# Patient Record
Sex: Female | Born: 1970 | Race: Black or African American | Hispanic: No | Marital: Single
Health system: Midwestern US, Community
[De-identification: ages and names within clinical notes are randomized; demographics above are authoritative.]

## PROBLEM LIST (undated history)

## (undated) DIAGNOSIS — N939 Abnormal uterine and vaginal bleeding, unspecified: Secondary | ICD-10-CM

## (undated) DIAGNOSIS — K219 Gastro-esophageal reflux disease without esophagitis: Secondary | ICD-10-CM

## (undated) DIAGNOSIS — M1990 Unspecified osteoarthritis, unspecified site: Secondary | ICD-10-CM

## (undated) DIAGNOSIS — E538 Deficiency of other specified B group vitamins: Secondary | ICD-10-CM

## (undated) DIAGNOSIS — K589 Irritable bowel syndrome without diarrhea: Secondary | ICD-10-CM

## (undated) DIAGNOSIS — G43909 Migraine, unspecified, not intractable, without status migrainosus: Secondary | ICD-10-CM

## (undated) DIAGNOSIS — M797 Fibromyalgia: Secondary | ICD-10-CM

## (undated) DIAGNOSIS — J45909 Unspecified asthma, uncomplicated: Secondary | ICD-10-CM

## (undated) DIAGNOSIS — D1771 Benign lipomatous neoplasm of kidney: Secondary | ICD-10-CM

## (undated) DIAGNOSIS — M7710 Lateral epicondylitis, unspecified elbow: Secondary | ICD-10-CM

## (undated) DIAGNOSIS — F419 Anxiety disorder, unspecified: Secondary | ICD-10-CM

## (undated) DIAGNOSIS — R0602 Shortness of breath: Secondary | ICD-10-CM

## (undated) DIAGNOSIS — N390 Urinary tract infection, site not specified: Secondary | ICD-10-CM

## (undated) DIAGNOSIS — Z87440 Personal history of urinary (tract) infections: Secondary | ICD-10-CM

## (undated) DIAGNOSIS — Z9884 Bariatric surgery status: Secondary | ICD-10-CM

## (undated) DIAGNOSIS — Z6841 Body Mass Index (BMI) 40.0 and over, adult: Secondary | ICD-10-CM

## (undated) DIAGNOSIS — E66813 Obesity, class 3: Secondary | ICD-10-CM

## (undated) DIAGNOSIS — Z01818 Encounter for other preprocedural examination: Secondary | ICD-10-CM

## (undated) HISTORY — DX: Migraine, unspecified, not intractable, without status migrainosus: G43.909

## (undated) HISTORY — DX: Deficiency of other specified B group vitamins: E53.8

## (undated) HISTORY — DX: Urinary tract infection, site not specified: N39.0

## (undated) HISTORY — DX: Lateral epicondylitis, unspecified elbow: M77.10

## (undated) HISTORY — PX: REDUCTION MAMMAPLASTY: SUR839

---
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
## 1999-01-25 ENCOUNTER — Ambulatory Visit (HOSPITAL_COMMUNITY): Admission: RE | Admit: 1999-01-25 | Discharge: 1999-01-25 | Payer: Self-pay | Admitting: Obstetrics and Gynecology

## 1999-01-25 ENCOUNTER — Encounter: Payer: Self-pay | Admitting: Obstetrics and Gynecology

## 1999-07-01 ENCOUNTER — Inpatient Hospital Stay (HOSPITAL_COMMUNITY): Admission: AD | Admit: 1999-07-01 | Discharge: 1999-07-03 | Payer: Self-pay | Admitting: Obstetrics and Gynecology

## 2000-08-14 ENCOUNTER — Other Ambulatory Visit: Admission: RE | Admit: 2000-08-14 | Discharge: 2000-08-14 | Payer: Self-pay | Admitting: Obstetrics and Gynecology

## 2001-08-20 ENCOUNTER — Other Ambulatory Visit: Admission: RE | Admit: 2001-08-20 | Discharge: 2001-08-20 | Payer: Self-pay | Admitting: Internal Medicine

## 2002-06-03 ENCOUNTER — Ambulatory Visit (HOSPITAL_COMMUNITY): Admission: RE | Admit: 2002-06-03 | Discharge: 2002-06-03 | Payer: Self-pay | Admitting: Oral & Maxillofacial Surgery

## 2002-09-03 ENCOUNTER — Other Ambulatory Visit: Admission: RE | Admit: 2002-09-03 | Discharge: 2002-09-03 | Payer: Self-pay | Admitting: Obstetrics and Gynecology

## 2002-12-11 HISTORY — PX: PALATAL EXPANSION: SHX2147

## 2003-07-07 ENCOUNTER — Ambulatory Visit (HOSPITAL_COMMUNITY): Admission: RE | Admit: 2003-07-07 | Discharge: 2003-07-07 | Payer: Self-pay | Admitting: Gastroenterology

## 2003-09-07 ENCOUNTER — Other Ambulatory Visit: Admission: RE | Admit: 2003-09-07 | Discharge: 2003-09-07 | Payer: Self-pay | Admitting: Obstetrics and Gynecology

## 2004-08-18 ENCOUNTER — Other Ambulatory Visit: Admission: RE | Admit: 2004-08-18 | Discharge: 2004-08-18 | Payer: Self-pay | Admitting: Obstetrics and Gynecology

## 2005-08-22 ENCOUNTER — Other Ambulatory Visit: Admission: RE | Admit: 2005-08-22 | Discharge: 2005-08-22 | Payer: Self-pay | Admitting: Obstetrics and Gynecology

## 2007-04-18 ENCOUNTER — Encounter: Admission: RE | Admit: 2007-04-18 | Discharge: 2007-04-18 | Payer: Self-pay | Admitting: Family Medicine

## 2008-04-27 ENCOUNTER — Emergency Department (HOSPITAL_COMMUNITY): Admission: EM | Admit: 2008-04-27 | Discharge: 2008-04-27 | Payer: Self-pay | Admitting: Family Medicine

## 2011-04-28 NOTE — Op Note (Signed)
   NAME:  Andrea [name], Andrea [name]                       ACCOUNT NO.:  [id number]   MEDICAL RECORD NO.:  [id number]                   PATIENT TYPE:  AMB   LOCATION:  ENDO                                 FACILITY:  [facility]   PHYSICIAN:  Graylin Shiver, M.D.                DATE OF BIRTH:  1971/07/15   DATE OF PROCEDURE:  07/07/2003  DATE OF DISCHARGE:                                 OPERATIVE REPORT   PROCEDURE:  Colonoscopy.   INDICATION FOR PROCEDURE:  Heme-positive stool.   Informed consent was obtained after explanation of the risks of bleeding,  infection, and perforation.   PREMEDICATION:  Fentanyl 60 mcg IV, Versed 6 mg IV.   DESCRIPTION OF PROCEDURE:  With the patient in the left lateral decubitus  position, a rectal exam was performed and no masses were felt.  The Olympus  colonoscope was inserted into the rectum and advanced around the colon to  the cecum.  Cecal landmarks were identified.  The terminal ileum was  intubated and the first few centimeters of terminal ileum looked normal.  The cecum and ascending colon were normal.  The transverse colon was normal.  The descending colon, sigmoid, and rectum were normal.  She tolerated the  procedure well without complications.   IMPRESSION:  Normal colonoscopy to the cecum.                                               Graylin Shiver, M.D.    SFG/MEDQ  D:  07/07/2003  T:  07/07/2003  Job:  [id number]   cc:   Tomi Bamberger, FNP, [facility]

## 2011-04-28 NOTE — H&P (Signed)
[facility]  Patient:    Andrea [name], Andrea [name] Visit Number: [id number] MRN: [id number]          Service Type: DSU Location: DAY Attending Physician:  Sammuel Bailiff Dictated by:   Dorthula Matas, D.D.S. Admit Date:  06/03/2002                           History and Physical  HISTORY OF PRESENT ILLNESS:  The patient is a 40 year old female who is well known to me who has a severe dental and skeletal dysplasia.  The patient has maxillary transverse constriction as well as a ______ posterior maxillary vertical access.  I have discussed with her at length the indications for surgery and have also discussed with her the pros and cons of surgical care. She is aware of these risks as well as benefits and desires to proceed with a two-stage correction of her skeletal dysplasia.  Stage I will consist of a surgically-assisted wrapped palate expansion to correct the maxillary transverse constriction, and stage II will be a Jerry Caras 1 osteotomy to impact the maxilla and close the open bite (apertognathia.  She is aware of the multiple risks associated with surgery which include but are not limited to the following:  Swelling, bruising, infection, sinus problems, nasal congestion, trismus or limited mouth opening, temporomandibular joint pain and/or dysfunction, need for further surgical care, need for further orthodontic care, possible relapse of the surgical correction and orthodontic correction, possible loss of soft tissue and/or teeth, possible need for root canal therapy, facial profile changes, and nasal width and shape changes.  The patient desires to proceed with the planned surgical procedure, and this will be done at [facility] in the main OR but as an outpatient today. Dictated by:   Dorthula Matas, D.D.S. Attending Physician:  Sammuel Bailiff DD:  06/03/02 TD:  06/03/02 Job: [id number]

## 2011-04-28 NOTE — Op Note (Signed)
[facility]  Patient:    Andrea [name], Andrea [name] Visit Number: [id number] MRN: [id number]          Service Type: DSU Location: DAY Attending Physician:  Sammuel Bailiff Dictated by:   Dorthula Matas, D.D.S. Proc. Date: 06/03/02 Admit Date:  06/03/2002                             Operative Report  PREOPERATIVE DIAGNOSIS:  Maxillary transverse constriction.  POSTOPERATIVE DIAGNOSES:  Maxillary transverse constriction.  OPERATION:  Surgically-assisted ________ palate expansion via modified LeFort wand osteotomy.  SURGEON:  Dorthula Matas, D.D.S.  ANESTHESIA:  General anesthesia via nose and tracheal tube.  CULTURES:  None.  DRAINS:  None.  SPECIMENS:  None.  COMPLICATIONS:  None.  DESCRIPTION OF PROCEDURE:  The patient brought to the OR and placed on the OR table in supine position.  She was then placed under general anesthesia, nose and tracheal tube was inserted.  She was prepped and draped in the sterile manner for an orthonathic surgical procedure.  At this point 0.5% Marcaine with 1:200,000 epinephrine was used to infiltrate into the maxillary vestibule and also used to give right and left greater palatine nerve blocks and a nasal palatine nerve block.  Posterior superior nerve blocks and infraorbital nerve blocks were also administered.  A total of 5.4 cc was given.  At this point a cautery cutting mode was used to make a horizontal incision in the ________ mucosa of the vestibule, starting at the right segment at buttress area and in the piriform rim area.  This incision was taken down to periosteum.  The periosteum was incised with a #15 blade.  A full-thickness mucoperiosteal flap was reflected and the infraorbital was identified, as well as the piriform rim.  A tunneling was done back to the jugular maxillary fissure region with a periosteal elevator.  The nasal mucosa along the lateral rim of the nose was also released with  the periosteal elevator.  At this point a horizontal osteotomy of the lateral maxillary wall was made with a 701 bur, starting at the right piriform rim and going back to the zygomatic buttress, and then from the zygomatic buttress posteriorly as far as I could go with the drill.  I then switched to the reciprocating saw and used the saw to cut some zygomatic buttress back to the posterior aspect of the maxillary sinus.  The jugular maxillary osteotome was then placed along the lateral and inferior aspect of the jugular maxillary fissure.  This was mounted to release the maxilla from the pterygoid plate.  At this point my attention was directed to the opposite side, where an incision was made from the zygomatic buttress region to the piriform rim region in the attached mucosa of the vestibule, using the Bovie cautery in cutting mode.  Again, the periosteum as released with a #15 blade and full-thickness mucoperiosteal flap was reflected.  A similar osteotomy cut was made from the zygomatic buttress to the piriform rim on this opposite side, and then using a reciprocating saw from the zygomatic buttress back to the pterygoid maxillary fissure region.  Again, similar dissection had been carried out with the nasal mucosa being reached from the lateral inferior aspect of the piriform rim.  Again, the pterygoid maxillary osteotome was placed in the pterygoid maxillary fissure at Dictated by:   Dorthula Matas, D.D.S. Attending Physician:  Sammuel Bailiff DD:  06/03/02 TD:  06/04/02 Job: [id number]

## 2011-06-23 ENCOUNTER — Encounter: Payer: Self-pay | Admitting: Family Medicine

## 2011-10-31 ENCOUNTER — Other Ambulatory Visit: Payer: Self-pay | Admitting: Family Medicine

## 2011-10-31 DIAGNOSIS — E0590 Thyrotoxicosis, unspecified without thyrotoxic crisis or storm: Secondary | ICD-10-CM

## 2011-11-15 ENCOUNTER — Other Ambulatory Visit: Payer: Self-pay | Admitting: Gastroenterology

## 2011-11-15 DIAGNOSIS — R1011 Right upper quadrant pain: Secondary | ICD-10-CM

## 2011-11-15 DIAGNOSIS — R110 Nausea: Secondary | ICD-10-CM

## 2011-11-23 ENCOUNTER — Encounter (HOSPITAL_COMMUNITY)
Admission: RE | Admit: 2011-11-23 | Discharge: 2011-11-23 | Disposition: A | Discharge status: Home / Self Care | Payer: BC Managed Care – PPO | Source: Ambulatory Visit | Attending: Family Medicine | Admitting: Family Medicine

## 2011-11-23 DIAGNOSIS — E0590 Thyrotoxicosis, unspecified without thyrotoxic crisis or storm: Secondary | ICD-10-CM

## 2011-11-24 ENCOUNTER — Encounter (HOSPITAL_COMMUNITY)
Admission: RE | Admit: 2011-11-24 | Discharge: 2011-11-24 | Disposition: A | Discharge status: Home / Self Care | Payer: BC Managed Care – PPO | Source: Ambulatory Visit | Attending: Family Medicine | Admitting: Family Medicine

## 2011-11-24 ENCOUNTER — Encounter (HOSPITAL_COMMUNITY): Payer: Self-pay

## 2011-11-24 MED ORDER — SODIUM IODIDE I 131 CAPSULE
6.9000 | Freq: Once | INTRAVENOUS | Status: AC | PRN
  Administered 2011-11-23: 6.9 via ORAL

## 2011-11-24 MED ORDER — SODIUM PERTECHNETATE TC 99M INJECTION
10.7000 | Freq: Once | INTRAVENOUS | Status: AC | PRN
  Administered 2011-11-24: 10.7 via INTRAVENOUS

## 2011-12-06 ENCOUNTER — Ambulatory Visit (HOSPITAL_COMMUNITY)
Admission: RE | Admit: 2011-12-06 | Discharge: 2011-12-06 | Disposition: A | Discharge status: Home / Self Care | Payer: BC Managed Care – PPO | Source: Ambulatory Visit | Attending: Gastroenterology | Admitting: Gastroenterology

## 2011-12-06 DIAGNOSIS — R1084 Generalized abdominal pain: Secondary | ICD-10-CM | POA: Insufficient documentation

## 2011-12-06 DIAGNOSIS — R110 Nausea: Secondary | ICD-10-CM

## 2011-12-06 DIAGNOSIS — R1011 Right upper quadrant pain: Secondary | ICD-10-CM

## 2011-12-06 DIAGNOSIS — R935 Abnormal findings on diagnostic imaging of other abdominal regions, including retroperitoneum: Secondary | ICD-10-CM | POA: Insufficient documentation

## 2011-12-06 MED ORDER — SINCALIDE 5 MCG IJ SOLR
INTRAMUSCULAR | Status: AC
  Administered 2011-12-06: 3.48 ug via INTRAVENOUS
  Filled 2011-12-06: qty 5

## 2011-12-06 MED ORDER — TECHNETIUM TC 99M MEBROFENIN IV KIT
5.0000 | PACK | Freq: Once | INTRAVENOUS | Status: AC | PRN
  Administered 2011-12-06: 5 via INTRAVENOUS

## 2012-06-24 ENCOUNTER — Other Ambulatory Visit: Payer: Self-pay | Admitting: Family Medicine

## 2012-06-24 DIAGNOSIS — N2889 Other specified disorders of kidney and ureter: Secondary | ICD-10-CM

## 2012-06-28 ENCOUNTER — Ambulatory Visit
Admission: RE | Admit: 2012-06-28 | Discharge: 2012-06-28 | Disposition: A | Discharge status: Home / Self Care | Payer: BC Managed Care – PPO | Source: Ambulatory Visit | Attending: Family Medicine | Admitting: Family Medicine

## 2012-06-28 DIAGNOSIS — N2889 Other specified disorders of kidney and ureter: Secondary | ICD-10-CM

## 2012-12-05 ENCOUNTER — Other Ambulatory Visit: Payer: Self-pay | Admitting: Family Medicine

## 2012-12-05 ENCOUNTER — Ambulatory Visit
Admission: RE | Admit: 2012-12-05 | Discharge: 2012-12-05 | Disposition: A | Discharge status: Home / Self Care | Payer: BC Managed Care – PPO | Source: Ambulatory Visit | Attending: Family Medicine | Admitting: Family Medicine

## 2012-12-05 DIAGNOSIS — M719 Bursopathy, unspecified: Secondary | ICD-10-CM

## 2013-03-07 ENCOUNTER — Ambulatory Visit (INDEPENDENT_AMBULATORY_CARE_PROVIDER_SITE_OTHER): Payer: BC Managed Care – PPO | Admitting: Family Medicine

## 2013-03-07 DIAGNOSIS — E538 Deficiency of other specified B group vitamins: Secondary | ICD-10-CM

## 2013-03-07 MED ORDER — CYANOCOBALAMIN 1000 MCG/ML IJ SOLN
1000.0000 ug | Freq: Once | INTRAMUSCULAR | Status: AC
  Administered 2013-03-07: 1000 ug via INTRAMUSCULAR

## 2013-04-11 ENCOUNTER — Ambulatory Visit (INDEPENDENT_AMBULATORY_CARE_PROVIDER_SITE_OTHER): Payer: BC Managed Care – PPO | Admitting: *Deleted

## 2013-04-11 DIAGNOSIS — E538 Deficiency of other specified B group vitamins: Secondary | ICD-10-CM

## 2013-04-11 MED ORDER — CYANOCOBALAMIN 1000 MCG/ML IJ SOLN: 1000.0000 ug | Freq: Once | INTRAMUSCULAR | Status: DC

## 2013-04-11 MED ORDER — CYANOCOBALAMIN 1000 MCG/ML IJ SOLN
1000.0000 ug | Freq: Once | INTRAMUSCULAR | Status: AC
  Administered 2013-04-11: 1000 ug via INTRAMUSCULAR

## 2013-04-26 ENCOUNTER — Other Ambulatory Visit: Payer: Self-pay | Admitting: Family Medicine

## 2013-04-28 NOTE — Telephone Encounter (Signed)
Med rf °

## 2013-05-27 ENCOUNTER — Ambulatory Visit (INDEPENDENT_AMBULATORY_CARE_PROVIDER_SITE_OTHER): Payer: BC Managed Care – PPO | Admitting: Family Medicine

## 2013-05-27 DIAGNOSIS — D519 Vitamin B12 deficiency anemia, unspecified: Secondary | ICD-10-CM

## 2013-05-27 DIAGNOSIS — D518 Other vitamin B12 deficiency anemias: Secondary | ICD-10-CM

## 2013-05-27 MED ORDER — CYANOCOBALAMIN 1000 MCG/ML IJ SOLN
1000.0000 ug | Freq: Once | INTRAMUSCULAR | Status: AC
  Administered 2013-05-27: 1000 ug via INTRAMUSCULAR

## 2013-06-06 ENCOUNTER — Telehealth: Payer: Self-pay | Admitting: Family Medicine

## 2013-06-06 MED ORDER — PROMETHAZINE HCL 25 MG PO TABS: 25.0000 mg | ORAL_TABLET | ORAL | Status: DC

## 2013-06-06 NOTE — Telephone Encounter (Signed)
Ok to refill 

## 2013-06-06 NOTE — Telephone Encounter (Signed)
?   OK to Refill  

## 2013-06-06 NOTE — Telephone Encounter (Signed)
rx done

## 2013-06-16 ENCOUNTER — Other Ambulatory Visit: Payer: Self-pay

## 2013-06-16 DIAGNOSIS — Z1231 Encounter for screening mammogram for malignant neoplasm of breast: Secondary | ICD-10-CM

## 2013-06-20 ENCOUNTER — Other Ambulatory Visit: Payer: Self-pay | Admitting: Family Medicine

## 2013-07-01 ENCOUNTER — Encounter: Payer: Self-pay | Admitting: Family Medicine

## 2013-07-01 ENCOUNTER — Ambulatory Visit (INDEPENDENT_AMBULATORY_CARE_PROVIDER_SITE_OTHER): Payer: BC Managed Care – PPO | Admitting: Family Medicine

## 2013-07-01 VITALS — BP 126/72 | HR 74 | Temp 98.4°F | Resp 16 | Wt 158.0 lb

## 2013-07-01 DIAGNOSIS — G43909 Migraine, unspecified, not intractable, without status migrainosus: Secondary | ICD-10-CM

## 2013-07-01 DIAGNOSIS — E538 Deficiency of other specified B group vitamins: Secondary | ICD-10-CM

## 2013-07-01 LAB — COMPLETE METABOLIC PANEL WITH GFR
AST: 14 U/L (ref 0–37)
Albumin: 4 g/dL (ref 3.5–5.2)
Alkaline Phosphatase: 54 U/L (ref 39–117)
BUN: 17 mg/dL (ref 6–23)
Potassium: 4 mEq/L (ref 3.5–5.3)
Sodium: 141 mEq/L (ref 135–145)
Total Bilirubin: 0.3 mg/dL (ref 0.3–1.2)
Total Protein: 6.2 g/dL (ref 6.0–8.3)

## 2013-07-01 LAB — CBC WITH DIFFERENTIAL/PLATELET
Basophils Absolute: 0 10*3/uL (ref 0.0–0.1)
Eosinophils Relative: 1 % (ref 0–5)
HCT: 40.9 % (ref 36.0–46.0)
Hemoglobin: 13.8 g/dL (ref 12.0–15.0)
Lymphocytes Relative: 23 % (ref 12–46)
MCV: 82.6 fL (ref 78.0–100.0)
Monocytes Absolute: 0.3 10*3/uL (ref 0.1–1.0)
Monocytes Relative: 3 % (ref 3–12)
RDW: 13.1 % (ref 11.5–15.5)
WBC: 7.9 10*3/uL (ref 4.0–10.5)

## 2013-07-01 MED ORDER — CYANOCOBALAMIN 1000 MCG/ML IJ SOLN
1000.0000 ug | Freq: Once | INTRAMUSCULAR | Status: AC
  Administered 2013-07-01: 1000 ug via INTRAMUSCULAR

## 2013-07-01 NOTE — Progress Notes (Signed)
Subjective:    Patient ID: Andrea [name], female    DOB: 09/17/1971, 42 y.o.   MRN: [id number]  HPI Patient was found to have fatigue over the last year. In the workup for her fatigue she was discovered to have B12 deficiency. She is currently taking B12 shots 1000 mcg IM q. Monthly.  She denies any further fatigue. She states she feels fine. She is due to have a B12 level checked. She is also due to check a CBC.  She also has a history of migraines. At present she is taking 50 mg of Topamax every night. She reports less than for serious migraines per month. They're currently well controlled. She very infrequently requires the Fioricet or the hydrocodone. In fact yesterday she was able to treat her headache with Excedrin migraine. She is not sure that she needs the Topamax anymore. She is discovered her triggers. These tend to be her menstrual cycles as well as changes in them at a pressure. If she is able to discover the trigger and treat it early the headache abates quickly.  She is currently on contraceptive pills that prevent her periods except quarterly.  This also has helped prevent migraines. She is interested in weaning off of topamax.  Past Medical History  Diagnosis Date  . Epicondylitis, lateral     Right  . Migraine headache   . Hyperthyroidism   . B12 deficiency    Current Outpatient Prescriptions on File Prior to Visit  Medication Sig Dispense Refill  . butalbital-acetaminophen-caffeine (FIORICET WITH CODEINE) 50-325-40-30 MG per capsule Take 1 capsule by mouth every 4 (four) hours as needed.        . cyanocobalamin (,VITAMIN B-12,) 1000 MCG/ML injection Inject 1 mL (1,000 mcg total) into the muscle once.  1 mL  0  . promethazine (PHENERGAN) 25 MG tablet Take 1 tablet (25 mg total) by mouth every 4 (four) hours.  30 tablet  0  . Vitamin D, Ergocalciferol, (DRISDOL) 50000 UNITS CAPS TAKE ONE CAPSULE BY MOUTH EVERY WEEK  4 capsule  0   No current facility-administered  medications on file prior to visit.   Allergies  Allergen Reactions  . Septra (Bactrim)   . Sulfa Antibiotics    History   Social History  . Marital Status: Married    Spouse Name: N/A    Number of Children: N/A  . Years of Education: N/A   Occupational History  . Not on file.   Social History Main Topics  . Smoking status: Never Smoker   . Smokeless tobacco: Not on file  . Alcohol Use: Not on file  . Drug Use: Not on file  . Sexually Active: Not on file   Other Topics Concern  . Not on file   Social History Narrative  . No narrative on file        Review of Systems  All other systems reviewed and are negative.       Objective:   Physical Exam  Vitals reviewed. Constitutional: She is oriented to person, place, and time. She appears well-developed and well-nourished. No distress.  HENT:  Head: Normocephalic.  Right Ear: External ear normal.  Left Ear: External ear normal.  Nose: Nose normal.  Mouth/Throat: Oropharynx is clear and moist. No oropharyngeal exudate.  Eyes: Conjunctivae and EOM are normal. Pupils are equal, round, and reactive to light. Right eye exhibits no discharge. Left eye exhibits no discharge. No scleral icterus.  Neck: Neck supple. No JVD present. No thyromegaly  present.  Cardiovascular: Normal rate, regular rhythm, normal heart sounds and intact distal pulses.   No murmur heard. Pulmonary/Chest: Effort normal and breath sounds normal. No respiratory distress. She has no wheezes. She has no rales. She exhibits no tenderness.  Abdominal: Soft. Bowel sounds are normal. She exhibits no distension. There is no tenderness. There is no rebound.  Lymphadenopathy:    She has no cervical adenopathy.  Neurological: She is alert and oriented to person, place, and time. No cranial nerve deficit. She exhibits normal muscle tone. Coordination normal.  Skin: Skin is warm. No rash noted. She is not diaphoretic. No erythema.  Psychiatric: She has a  normal mood and affect. Her behavior is normal. Judgment and thought content normal.          Assessment & Plan:  1. B12 deficiency Recheck CBC and B12 level.   - CBC with Differential - Vitamin B12  2. Migraines Migraines are currently well controlled. Try to decrease Topamax 25 mg by mouth each bedtime. If the migraines continue to be less than for severe headaches a month after decreasing the dose of Topamax, discontinue the medication altogether. I will check a CBC and a CMP today to evaluate for metabolic dyscrasias due to the seizure drug. - COMPLETE METABOLIC PANEL WITH GFR

## 2013-07-15 ENCOUNTER — Other Ambulatory Visit: Payer: Self-pay | Admitting: Family Medicine

## 2013-07-15 NOTE — Telephone Encounter (Signed)
OK refill high dose Vitamin D?

## 2013-07-15 NOTE — Telephone Encounter (Signed)
rx refilled.

## 2013-07-15 NOTE — Telephone Encounter (Signed)
ok 

## 2013-07-16 ENCOUNTER — Other Ambulatory Visit: Payer: Self-pay | Admitting: Family Medicine

## 2013-07-21 ENCOUNTER — Ambulatory Visit
Admission: RE | Admit: 2013-07-21 | Discharge: 2013-07-21 | Disposition: A | Discharge status: Home / Self Care | Payer: BC Managed Care – PPO | Source: Ambulatory Visit

## 2013-07-21 DIAGNOSIS — Z1231 Encounter for screening mammogram for malignant neoplasm of breast: Secondary | ICD-10-CM

## 2013-08-25 ENCOUNTER — Ambulatory Visit: Payer: BC Managed Care – PPO

## 2013-09-02 ENCOUNTER — Observation Stay (HOSPITAL_COMMUNITY)
Admission: EM | Admit: 2013-09-02 | Discharge: 2013-09-03 | Disposition: A | Discharge status: Home / Self Care | Payer: BC Managed Care – PPO | Attending: Internal Medicine | Admitting: Internal Medicine

## 2013-09-02 ENCOUNTER — Emergency Department (HOSPITAL_COMMUNITY): Payer: BC Managed Care – PPO

## 2013-09-02 ENCOUNTER — Other Ambulatory Visit: Payer: Self-pay

## 2013-09-02 ENCOUNTER — Encounter (HOSPITAL_COMMUNITY): Payer: Self-pay | Admitting: Emergency Medicine

## 2013-09-02 DIAGNOSIS — K589 Irritable bowel syndrome without diarrhea: Secondary | ICD-10-CM | POA: Insufficient documentation

## 2013-09-02 DIAGNOSIS — E0590 Thyrotoxicosis, unspecified without thyrotoxic crisis or storm: Secondary | ICD-10-CM | POA: Insufficient documentation

## 2013-09-02 DIAGNOSIS — E538 Deficiency of other specified B group vitamins: Secondary | ICD-10-CM | POA: Insufficient documentation

## 2013-09-02 DIAGNOSIS — R079 Chest pain, unspecified: Secondary | ICD-10-CM

## 2013-09-02 DIAGNOSIS — K219 Gastro-esophageal reflux disease without esophagitis: Secondary | ICD-10-CM | POA: Insufficient documentation

## 2013-09-02 DIAGNOSIS — M7711 Lateral epicondylitis, right elbow: Secondary | ICD-10-CM

## 2013-09-02 DIAGNOSIS — M7710 Lateral epicondylitis, unspecified elbow: Secondary | ICD-10-CM

## 2013-09-02 DIAGNOSIS — Z8249 Family history of ischemic heart disease and other diseases of the circulatory system: Secondary | ICD-10-CM

## 2013-09-02 DIAGNOSIS — R9431 Abnormal electrocardiogram [ECG] [EKG]: Secondary | ICD-10-CM | POA: Insufficient documentation

## 2013-09-02 DIAGNOSIS — M129 Arthropathy, unspecified: Secondary | ICD-10-CM | POA: Insufficient documentation

## 2013-09-02 DIAGNOSIS — G43909 Migraine, unspecified, not intractable, without status migrainosus: Secondary | ICD-10-CM | POA: Insufficient documentation

## 2013-09-02 DIAGNOSIS — I498 Other specified cardiac arrhythmias: Secondary | ICD-10-CM | POA: Insufficient documentation

## 2013-09-02 DIAGNOSIS — R0789 Other chest pain: Principal | ICD-10-CM | POA: Insufficient documentation

## 2013-09-02 DIAGNOSIS — Z79899 Other long term (current) drug therapy: Secondary | ICD-10-CM | POA: Insufficient documentation

## 2013-09-02 HISTORY — DX: Irritable bowel syndrome, unspecified: K58.9

## 2013-09-02 HISTORY — DX: Benign lipomatous neoplasm of kidney: D17.71

## 2013-09-02 HISTORY — DX: Shortness of breath: R06.02

## 2013-09-02 HISTORY — DX: Gastro-esophageal reflux disease without esophagitis: K21.9

## 2013-09-02 HISTORY — DX: Unspecified osteoarthritis, unspecified site: M19.90

## 2013-09-02 LAB — TSH: TSH: 5.263 u[IU]/mL — ABNORMAL HIGH (ref 0.350–4.500)

## 2013-09-02 LAB — RAPID URINE DRUG SCREEN, HOSP PERFORMED
Amphetamines: NOT DETECTED
Barbiturates: POSITIVE — AB
Benzodiazepines: NOT DETECTED
Cocaine: NOT DETECTED
Tetrahydrocannabinol: NOT DETECTED

## 2013-09-02 LAB — CBC
HCT: 41.7 % (ref 36.0–46.0)
Hemoglobin: 14.5 g/dL (ref 12.0–15.0)
MCH: 29.8 pg (ref 26.0–34.0)
MCV: 85.6 fL (ref 78.0–100.0)
RBC: 4.87 MIL/uL (ref 3.87–5.11)

## 2013-09-02 LAB — TROPONIN I
Troponin I: <0.3 ng/mL (ref <0.30)
Troponin I: <0.3 ng/mL (ref <0.30)
Troponin I: <0.3 ng/mL (ref <0.30)

## 2013-09-02 LAB — D-DIMER, QUANTITATIVE: D-Dimer, Quant: <0.27 ug/mL-FEU (ref 0.00–0.48)

## 2013-09-02 LAB — HEMOGLOBIN A1C
Hgb A1c MFr Bld: 5.6 % (ref <5.7)
Mean Plasma Glucose: 114 mg/dL (ref <117)

## 2013-09-02 LAB — LIPID PANEL
Cholesterol: 160 mg/dL (ref 0–200)
LDL Cholesterol: 72 mg/dL (ref 0–99)
Total CHOL/HDL Ratio: 2.4 RATIO
VLDL: 22 mg/dL (ref 0–40)

## 2013-09-02 LAB — BASIC METABOLIC PANEL
BUN: 14 mg/dL (ref 6–23)
CO2: 24 mEq/L (ref 19–32)
Calcium: 9.4 mg/dL (ref 8.4–10.5)
Creatinine, Ser: 0.77 mg/dL (ref 0.50–1.10)
GFR calc non Af Amer: >90 mL/min (ref >90)
Glucose, Bld: 103 mg/dL — ABNORMAL HIGH (ref 70–99)

## 2013-09-02 LAB — POCT I-STAT TROPONIN I: Troponin i, poc: 0 ng/mL (ref 0.00–0.08)

## 2013-09-02 LAB — SEDIMENTATION RATE: Sed Rate: 6 mm/hr (ref 0–22)

## 2013-09-02 MED ORDER — MORPHINE SULFATE 4 MG/ML IJ SOLN
4.0000 mg | Freq: Once | INTRAMUSCULAR | Status: AC
  Administered 2013-09-02: 4 mg via INTRAVENOUS
  Filled 2013-09-02: qty 1

## 2013-09-02 MED ORDER — GI COCKTAIL ~~LOC~~
30.0000 mL | Freq: Once | ORAL | Status: AC
  Administered 2013-09-02: 30 mL via ORAL
  Filled 2013-09-02: qty 30

## 2013-09-02 MED ORDER — PROMETHAZINE HCL 25 MG PO TABS: 25.0000 mg | ORAL_TABLET | ORAL | Status: DC | PRN

## 2013-09-02 MED ORDER — NORGESTIMATE-ETH ESTRADIOL 0.25-35 MG-MCG PO TABS
1.0000 | ORAL_TABLET | Freq: Every day | ORAL | Status: DC
  Filled 2013-09-02 (×2): qty 1

## 2013-09-02 MED ORDER — ALBUTEROL SULFATE HFA 108 (90 BASE) MCG/ACT IN AERS
2.0000 | INHALATION_SPRAY | Freq: Four times a day (QID) | RESPIRATORY_TRACT | Status: DC | PRN
  Filled 2013-09-02: qty 6.7

## 2013-09-02 MED ORDER — DIPHENHYDRAMINE HCL 25 MG PO CAPS
25.0000 mg | ORAL_CAPSULE | Freq: Every evening | ORAL | Status: DC | PRN
  Administered 2013-09-02: 25 mg via ORAL
  Filled 2013-09-02: qty 1

## 2013-09-02 MED ORDER — NITROGLYCERIN 0.4 MG SL SUBL: 0.4000 mg | SUBLINGUAL_TABLET | SUBLINGUAL | Status: DC | PRN

## 2013-09-02 MED ORDER — ONDANSETRON HCL 4 MG/2ML IJ SOLN
4.0000 mg | Freq: Four times a day (QID) | INTRAMUSCULAR | Status: DC | PRN
  Administered 2013-09-03: 4 mg via INTRAVENOUS
  Filled 2013-09-02: qty 2

## 2013-09-02 MED ORDER — TOPIRAMATE 25 MG PO TABS
25.0000 mg | ORAL_TABLET | Freq: Every day | ORAL | Status: DC
  Administered 2013-09-02: 25 mg via ORAL
  Filled 2013-09-02 (×2): qty 1

## 2013-09-02 MED ORDER — SALINE SPRAY 0.2 % NA SOLN: 1.0000 | NASAL | Status: DC | PRN

## 2013-09-02 MED ORDER — VITAMIN D (ERGOCALCIFEROL) 1.25 MG (50000 UNIT) PO CAPS
50000.0000 [IU] | ORAL_CAPSULE | ORAL | Status: DC
  Filled 2013-09-02: qty 1

## 2013-09-02 MED ORDER — DIPHENHYDRAMINE HCL 50 MG/ML IJ SOLN
25.0000 mg | Freq: Once | INTRAMUSCULAR | Status: AC
  Administered 2013-09-02: 25 mg via INTRAVENOUS
  Filled 2013-09-02: qty 1

## 2013-09-02 MED ORDER — GI COCKTAIL ~~LOC~~: 30.0000 mL | Freq: Once | ORAL | Status: DC

## 2013-09-02 MED ORDER — CODEINE SULFATE 15 MG PO TABS: 30.0000 mg | ORAL_TABLET | ORAL | Status: DC | PRN

## 2013-09-02 MED ORDER — SODIUM CHLORIDE 0.9 % IV SOLN
INTRAVENOUS | Status: DC
  Administered 2013-09-02: 10:00:00 via INTRAVENOUS

## 2013-09-02 MED ORDER — ASPIRIN 81 MG PO CHEW
324.0000 mg | CHEWABLE_TABLET | Freq: Once | ORAL | Status: AC
  Administered 2013-09-02: 324 mg via ORAL
  Filled 2013-09-02: qty 4

## 2013-09-02 MED ORDER — REGADENOSON 0.4 MG/5ML IV SOLN
0.4000 mg | Freq: Once | INTRAVENOUS | Status: DC
  Filled 2013-09-02: qty 5

## 2013-09-02 MED ORDER — SALINE SPRAY 0.65 % NA SOLN
1.0000 | NASAL | Status: DC | PRN
  Filled 2013-09-02: qty 44

## 2013-09-02 MED ORDER — SODIUM CHLORIDE 0.9 % IV SOLN
INTRAVENOUS | Status: DC
  Administered 2013-09-02: 21:00:00 via INTRAVENOUS

## 2013-09-02 MED ORDER — HYDROCODONE-ACETAMINOPHEN 5-325 MG PO TABS
1.0000 | ORAL_TABLET | Freq: Four times a day (QID) | ORAL | Status: DC | PRN
  Administered 2013-09-03: 1 via ORAL
  Filled 2013-09-02: qty 1

## 2013-09-02 MED ORDER — ASPIRIN-ACETAMINOPHEN-CAFFEINE 250-250-65 MG PO TABS
2.0000 | ORAL_TABLET | Freq: Four times a day (QID) | ORAL | Status: DC | PRN
  Filled 2013-09-02: qty 2

## 2013-09-02 MED ORDER — ENOXAPARIN SODIUM 40 MG/0.4ML ~~LOC~~ SOLN
40.0000 mg | SUBCUTANEOUS | Status: DC
  Filled 2013-09-02 (×2): qty 0.4

## 2013-09-02 MED ORDER — LINACLOTIDE 290 MCG PO CAPS: 290.0000 ug | ORAL_CAPSULE | ORAL | Status: DC

## 2013-09-02 MED ORDER — BUTALBITAL-APAP-CAFF-COD 50-325-40-30 MG PO CAPS: 1.0000 | ORAL_CAPSULE | ORAL | Status: DC | PRN

## 2013-09-02 MED ORDER — BUTALBITAL-APAP-CAFFEINE 50-325-40 MG PO TABS: 1.0000 | ORAL_TABLET | ORAL | Status: DC | PRN

## 2013-09-02 MED ORDER — IBUPROFEN 600 MG PO TABS
600.0000 mg | ORAL_TABLET | Freq: Four times a day (QID) | ORAL | Status: DC | PRN
  Filled 2013-09-02: qty 1

## 2013-09-02 MED ORDER — ACETAMINOPHEN 325 MG PO TABS
650.0000 mg | ORAL_TABLET | ORAL | Status: DC | PRN
  Administered 2013-09-03: 650 mg via ORAL

## 2013-09-02 MED ORDER — ONDANSETRON HCL 4 MG/2ML IJ SOLN
4.0000 mg | Freq: Once | INTRAMUSCULAR | Status: AC
  Administered 2013-09-02: 4 mg via INTRAVENOUS
  Filled 2013-09-02: qty 2

## 2013-09-02 MED ORDER — MORPHINE SULFATE 2 MG/ML IJ SOLN
2.0000 mg | INTRAMUSCULAR | Status: DC | PRN
  Filled 2013-09-02: qty 1

## 2013-09-02 NOTE — Consult Note (Signed)
Reason for Consult: Chest Pain Referring Physician: TRH   HPI: The patient is a 42 y/o female, who presented to [facility] for evaluation of chest pain. Her only positive cardiac risk factor is a strong family history of early CAD. Her father suffered a MI at the age of 71. She denies a history of HTN, DM and HLD. She has no history of tobacco use. She states that she was in her usual state of health until yesterday AM. She was at work and developed resting right sided neck and shoulder pain that radiated to her mid sternal area. It started out as a sharp pain and transitioned to a pressure type discomfort. It was not worsened with exertion, but was positional and worse in the supine position and was relieved sitting up. It is also pleuritic. She denies SOB, diaphoresis, cough,  fever or chills, but has noted generalized malaise over the past 2 weeks. W/u thus far includes negative cardiac enzymes x 2. Her EKGs have demonstrated mild sinus tachycardia (HR of 101) and some T wave abnormalities. No acute changes. CXR is unremarkable. D-dimer is negative. WBC slightly elevated at 12.0.   Past Medical History  Diagnosis Date  . B12 deficiency   . IBS (irritable bowel syndrome)   . Shortness of breath     "all the time when I came in today" (09/02/2013)  . GERD (gastroesophageal reflux disease)   . Migraine headache     "maybe 6-8 times/yr" (09/02/2013)  . Epicondylitis, lateral     Right  . Arthritis     "hands" (09/02/2013)  . Angiolipoma of kidney     Past Surgical History  Procedure Laterality Date  . Reduction mammaplasty Bilateral ~ 2009  . Palatal expansion  2004  . Appendectomy  1984    Family History  Problem Relation Age of Onset  . Coronary artery disease Father 6    MI    Social History:  reports that she has never smoked. She has never used smokeless tobacco. She reports that she does not drink alcohol or use illicit drugs.  Allergies:  Allergies  Allergen Reactions  . Ceftin  [Cefuroxime Axetil] Rash  . Sulfa Antibiotics Rash    Medications:  Prior to Admission medications   Medication Sig Start Date End Date Taking? Authorizing Provider  albuterol (PROVENTIL HFA;VENTOLIN HFA) 108 (90 BASE) MCG/ACT inhaler Inhale 2 puffs into the lungs every 6 (six) hours as needed for wheezing.   Yes Historical Provider, MD  aspirin-acetaminophen-caffeine (EXCEDRIN MIGRAINE) [phone] MG per tablet Take 2 tablets by mouth every 6 (six) hours as needed for pain.   Yes Historical Provider, MD  butalbital-acetaminophen-caffeine (FIORICET WITH CODEINE) 50-325-40-30 MG per capsule Take 1 capsule by mouth every 4 (four) hours as needed for migraine.    Yes Historical Provider, MD  HYDROcodone-acetaminophen (NORCO/VICODIN) 5-325 MG per tablet Take 1 tablet by mouth every 6 (six) hours as needed for pain (PRN Migraine).   Yes Historical Provider, MD  ibuprofen (ADVIL,MOTRIN) 200 MG tablet Take 600 mg by mouth every 6 (six) hours as needed for pain.   Yes Historical Provider, MD  Linaclotide (LINZESS) 290 MCG CAPS Take 290 mcg by mouth every other day.    Yes Historical Provider, MD  norgestimate-ethinyl estradiol (SPRINTEC 28) 0.25-35 MG-MCG tablet Take 1 tablet by mouth daily.   Yes Historical Provider, MD  promethazine (PHENERGAN) 25 MG tablet Take 25 mg by mouth every 4 (four) hours as needed for nausea (as needed for nausea related  to migraine).   Yes Historical Provider, MD  Saline 0.2 % SOLN Place 1 spray into both nostrils as needed (dryness).   Yes Historical Provider, MD  topiramate (TOPAMAX) 25 MG tablet Take 25 mg by mouth at bedtime.   Yes Historical Provider, MD  Vitamin D, Ergocalciferol, (DRISDOL) 50000 UNITS CAPS capsule Take 50,000 Units by mouth every 7 (seven) days.   Yes Historical Provider, MD     Results for orders placed during the hospital encounter of 09/02/13 (from the past 48 hour(s))  CBC     Status: Abnormal   Collection Time    09/02/13  1:19 AM       Result Value Range   WBC 12.0 (*) 4.0 - 10.5 K/uL   RBC 4.87  3.87 - 5.11 MIL/uL   Hemoglobin 14.5  12.0 - 15.0 g/dL   HCT 45.4  09.8 - 11.9 %   MCV 85.6  78.0 - 100.0 fL   MCH 29.8  26.0 - 34.0 pg   MCHC 34.8  30.0 - 36.0 g/dL   RDW 14.7  82.9 - 56.2 %   Platelets 217  150 - 400 K/uL  BASIC METABOLIC PANEL     Status: Abnormal   Collection Time    09/02/13  1:19 AM      Result Value Range   Sodium 141  135 - 145 mEq/L   Potassium 3.8  3.5 - 5.1 mEq/L   Chloride 105  96 - 112 mEq/L   CO2 24  19 - 32 mEq/L   Glucose, Bld 103 (*) 70 - 99 mg/dL   BUN 14  6 - 23 mg/dL   Creatinine, Ser 1.30  0.50 - 1.10 mg/dL   Calcium 9.4  8.4 - 86.5 mg/dL   GFR calc non Af Amer >90  >90 mL/min   GFR calc Af Amer >90  >90 mL/min   Comment: (NOTE)     The eGFR has been calculated using the CKD EPI equation.     This calculation has not been validated in all clinical situations.     eGFR's persistently <90 mL/min signify possible Chronic Kidney     Disease.  PRO B NATRIURETIC PEPTIDE     Status: Abnormal   Collection Time    09/02/13  1:19 AM      Result Value Range   Pro B Natriuretic peptide (BNP) 141.2 (*) 0 - 125 pg/mL  POCT I-STAT TROPONIN I     Status: None   Collection Time    09/02/13  1:24 AM      Result Value Range   Troponin i, poc 0.00  0.00 - 0.08 ng/mL   Comment 3            Comment: Due to the release kinetics of cTnI,     a negative result within the first hours     of the onset of symptoms does not rule out     myocardial infarction with certainty.     If myocardial infarction is still suspected,     repeat the test at appropriate intervals.  D-DIMER, QUANTITATIVE     Status: None   Collection Time    09/02/13  4:15 AM      Result Value Range   D-Dimer, Quant <0.27  0.00 - 0.48 ug/mL-FEU   Comment:            AT THE INHOUSE ESTABLISHED CUTOFF     VALUE OF 0.48 ug/mL FEU,  THIS ASSAY HAS BEEN DOCUMENTED     IN THE LITERATURE TO HAVE     A SENSITIVITY AND NEGATIVE      PREDICTIVE VALUE OF AT LEAST     98 TO 99%.  THE TEST RESULT     SHOULD BE CORRELATED WITH     AN ASSESSMENT OF THE CLINICAL     PROBABILITY OF DVT / VTE.  HEMOGLOBIN A1C     Status: None   Collection Time    09/02/13 11:10 AM      Result Value Range   Hemoglobin A1C 5.6  <5.7 %   Comment: (NOTE)                                                                               According to the ADA Clinical Practice Recommendations for 2011, when     HbA1c is used as a screening test:      >=6.5%   Diagnostic of Diabetes Mellitus               (if abnormal result is confirmed)     5.7-6.4%   Increased risk of developing Diabetes Mellitus     References:Diagnosis and Classification of Diabetes Mellitus,Diabetes     Care,2011,34(Suppl 1):S62-S69 and Standards of Medical Care in             Diabetes - 2011,Diabetes Care,2011,34 (Suppl 1):S11-S61.   Mean Plasma Glucose 114  <117 mg/dL   Comment: Performed at [facility]  LIPID PANEL     Status: None   Collection Time    09/02/13 11:10 AM      Result Value Range   Cholesterol 160  0 - 200 mg/dL   Triglycerides 161  <096 mg/dL   HDL 66  >04 mg/dL   Total CHOL/HDL Ratio 2.4     VLDL 22  0 - 40 mg/dL   LDL Cholesterol 72  0 - 99 mg/dL   Comment:            Total Cholesterol/HDL:CHD Risk     Coronary Heart Disease Risk Table                         Men   Women      1/2 Average Risk   3.4   3.3      Average Risk       5.0   4.4      2 X Average Risk   9.6   7.1      3 X Average Risk  23.4   11.0                Use the calculated Patient Ratio     above and the CHD Risk Table     to determine the patient's CHD Risk.                ATP III CLASSIFICATION (LDL):      <100     mg/dL   Optimal      540-981  mg/dL   Near or Above  Optimal      130-159  mg/dL   Borderline      782-956  mg/dL   High      >213     mg/dL   Very High  TROPONIN I     Status: None   Collection Time    09/02/13 11:10 AM       Result Value Range   Troponin I <0.30  <0.30 ng/mL   Comment:            Due to the release kinetics of cTnI,     a negative result within the first hours     of the onset of symptoms does not rule out     myocardial infarction with certainty.     If myocardial infarction is still suspected,     repeat the test at appropriate intervals.  URINE RAPID DRUG SCREEN (HOSP PERFORMED)     Status: Abnormal   Collection Time    09/02/13 12:52 PM      Result Value Range   Opiates POSITIVE (*) NONE DETECTED   Cocaine NONE DETECTED  NONE DETECTED   Benzodiazepines NONE DETECTED  NONE DETECTED   Amphetamines NONE DETECTED  NONE DETECTED   Tetrahydrocannabinol NONE DETECTED  NONE DETECTED   Barbiturates POSITIVE (*) NONE DETECTED   Comment:            DRUG SCREEN FOR MEDICAL PURPOSES     ONLY.  IF CONFIRMATION IS NEEDED     FOR ANY PURPOSE, NOTIFY LAB     WITHIN 5 DAYS.                LOWEST DETECTABLE LIMITS     FOR URINE DRUG SCREEN     Drug Class       Cutoff (ng/mL)     Amphetamine      1000     Barbiturate      200     Benzodiazepine   200     Tricyclics       300     Opiates          300     Cocaine          300     THC              50  TROPONIN I     Status: None   Collection Time    09/02/13  3:20 PM      Result Value Range   Troponin I <0.30  <0.30 ng/mL   Comment:            Due to the release kinetics of cTnI,     a negative result within the first hours     of the onset of symptoms does not rule out     myocardial infarction with certainty.     If myocardial infarction is still suspected,     repeat the test at appropriate intervals.    Dg Chest Portable 1 View  09/02/2013   CLINICAL DATA:  Chest pain into right shoulder and jaw  EXAM: PORTABLE CHEST - 1 VIEW  COMPARISON:  None.  FINDINGS: The heart size and mediastinal contours are within normal limits. Both lungs are clear. The visualized skeletal structures are unremarkable.  IMPRESSION: No active disease.    Electronically Signed   By: Tiburcio Pea   On: 09/02/2013 03:04    Review of Systems  Constitutional: Positive for malaise/fatigue. Negative for fever, chills  and diaphoresis.  HENT: Positive for neck pain.   Respiratory: Negative for cough, sputum production and shortness of breath.   Cardiovascular: Positive for chest pain. Negative for orthopnea, leg swelling and PND.  Gastrointestinal: Positive for nausea. Negative for vomiting.  Musculoskeletal: Positive for joint pain (right shoulder).  Neurological: Negative for dizziness and loss of consciousness.  All other systems reviewed and are negative.   Blood pressure 103/73, pulse 83, temperature 98.4 F (36.9 C), temperature source Oral, resp. rate 16, last menstrual period 08/18/2013, SpO2 98.00%. Physical Exam  Constitutional: She is oriented to person, place, and time. She appears well-developed and well-nourished. No distress.  Neck: No JVD present. Carotid bruit is not present.  Cardiovascular: Normal rate, regular rhythm, normal heart sounds and intact distal pulses.  Exam reveals no gallop and no friction rub.   No murmur heard. Pulses:      Radial pulses are 2+ on the right side, and 2+ on the left side.       Dorsalis pedis pulses are 2+ on the right side, and 2+ on the left side.  Respiratory: Effort normal and breath sounds normal. No respiratory distress. She has no wheezes. She has no rales. She exhibits no tenderness.  GI: Soft. Bowel sounds are normal. She exhibits no distension and no mass. There is no tenderness.  Musculoskeletal: She exhibits no edema.  Neurological: She is alert and oriented to person, place, and time.  Skin: Skin is warm and dry. She is not diaphoretic.  Psychiatric: She has a normal mood and affect. Her behavior is normal.    Assessment/Plan: Principal Problem:   Chest pain with low risk for cardiac etiology Active Problems:   Family history of early CAD  Plan: 42 y/o female with  positive family h/o early CAD, with otherwise no other significant cardiac risk factors, admitted for evaluation of chest pain. EKGs have demonstrated mild sinus tachycardia, some T wave abnormalities but no acute changes. Troponins negative x 2. CXR unremarkable. D-dimer negative. WBC slightly elevated. Her symptoms sound more atypical. She has had both sharp and pressure like pain. It is pleuritic and positional (exacerbated in the supine position and alleviated sitting up). With these characteristics, I question if she has pericarditis, although her EKGs do not demonstrate the typical features (diffuse ST elevations or PR depressions). In addition, she has noted generalized malaise x 2 weeks and has a slightly elevated WBC. I will order a ESR  for possible pericarditis. May consider a trial of NSAIDS. Renal function is normal.  I agree with a NST for risk stratification. Will order for tomorrow. Will make NPO starting at midnight.  Will have result of NST tomorrow afternoon. We will also consider obtaining a 2D echo for tomorrow. Will discuss the need for this with MD.  MD to follow with further recommendations.   Allayne Butcher, PA-C 09/02/2013, 6:22 PM    I have seen and examined the patient along with BRITTAINY M. SIMMONS, PA-C.  I have reviewed the chart, notes and new data.  I agree with PA's note.  Key new complaints: symptoms are indeed not typical, but she is very preoccupied by her strong family history Key examination changes: normal CV exam Key new findings / data: ECG and enzymes are unremarkable  PLAN: Stress test and echo are a reasonable approach to evaluate for coronary insufficiency and pericarditis, but suspect the symptoms are non cardiac.  Thurmon Fair, MD, [facility] [phone] 09/02/2013, 7:10 PM

## 2013-09-02 NOTE — Progress Notes (Signed)
BP 103/73  Pulse 83  Temp(Src) 98.4 F (36.9 C) (Oral)  Resp 16  SpO2 98%  LMP 08/18/2013 - cardiac markers negative x1. - Atypical chest pain. - If EKG negative in amand CM negative can be d/c in am

## 2013-09-02 NOTE — ED Notes (Signed)
Pt. reports right chest pain " pressure" radiating to right shoulder ,right neck and right upper back onset yesterday morning with SOB and slight nausea.

## 2013-09-02 NOTE — Progress Notes (Signed)
Utilization review complete. Emersyn Kotarski RN CCM Case Mgmt  

## 2013-09-02 NOTE — ED Provider Notes (Signed)
CSN: [id number]     Arrival date & time 09/02/13  0044 History   First MD Initiated Contact with Patient 09/02/13 0400     Chief Complaint  Patient presents with  . Chest Pain   (Consider location/radiation/quality/duration/timing/severity/associated sxs/prior Treatment) HPI History provided by patient. Around 12 AM woke with right-sided chest pain described as heaviness moderate to severe with associated shortness of breath. Pain radiating to right shoulder and right neck. Some nausea but no vomiting or diaphoresis. Pain still present but much improved. No history of same. No known history of heart disease. No cough or recent illness. No leg pain or leg swelling. No history of DVT or PE. Past Medical History  Diagnosis Date  . Epicondylitis, lateral     Right  . Migraine headache   . Hyperthyroidism   . B12 deficiency   . IBS (irritable bowel syndrome)    Past Surgical History  Procedure Laterality Date  . Appendectomy    . Breast reduction surgery     No family history on file. History  Substance Use Topics  . Smoking status: Never Smoker   . Smokeless tobacco: Not on file  . Alcohol Use: No   OB History   Grav Para Term Preterm Abortions TAB SAB Ect Mult Living                 Review of Systems  Constitutional: Negative for fever and chills.  HENT: Negative for neck pain and neck stiffness.   Respiratory: Positive for shortness of breath.   Cardiovascular: Positive for chest pain.  Gastrointestinal: Negative for abdominal pain.  Genitourinary: Negative for flank pain.  Musculoskeletal: Negative for back pain.  Skin: Negative for rash.  Neurological: Negative for headaches.  All other systems reviewed and are negative.    Allergies  Ceftin and Sulfa antibiotics  Home Medications   Current Outpatient Rx  Name  Route  Sig  Dispense  Refill  . albuterol (PROVENTIL HFA;VENTOLIN HFA) 108 (90 BASE) MCG/ACT inhaler   Inhalation   Inhale 2 puffs into the lungs  every 6 (six) hours as needed for wheezing.         Marland Kitchen aspirin-acetaminophen-caffeine (EXCEDRIN MIGRAINE) 250-250-65 MG per tablet   Oral   Take 2 tablets by mouth every 6 (six) hours as needed for pain.         . butalbital-acetaminophen-caffeine (FIORICET WITH CODEINE) 50-325-40-30 MG per capsule   Oral   Take 1 capsule by mouth every 4 (four) hours as needed for migraine.          Marland Kitchen HYDROcodone-acetaminophen (NORCO/VICODIN) 5-325 MG per tablet   Oral   Take 1 tablet by mouth every 6 (six) hours as needed for pain (PRN Migraine).         Marland Kitchen ibuprofen (ADVIL,MOTRIN) 200 MG tablet   Oral   Take 600 mg by mouth every 6 (six) hours as needed for pain.         Marland Kitchen Linaclotide (LINZESS) 290 MCG CAPS   Oral   Take 290 mcg by mouth every other day.          . norgestimate-ethinyl estradiol (SPRINTEC 28) 0.25-35 MG-MCG tablet   Oral   Take 1 tablet by mouth daily.         . promethazine (PHENERGAN) 25 MG tablet   Oral   Take 1 tablet (25 mg total) by mouth every 4 (four) hours.   30 tablet   0   . Saline 0.2 %  SOLN   Each Nare   Place 1 spray into both nostrils as needed (dryness).         . topiramate (TOPAMAX) 25 MG tablet   Oral   Take 25 mg by mouth at bedtime.         . Vitamin D, Ergocalciferol, (DRISDOL) 50000 UNITS CAPS capsule   Oral   Take 50,000 Units by mouth every 7 (seven) days.          BP 104/80  Pulse 96  Temp(Src) 98.7 F (37.1 C) (Oral)  Resp 14  SpO2 99%  LMP 08/18/2013 Physical Exam  Constitutional: She is oriented to person, place, and time. She appears well-developed and well-nourished.  HENT:  Head: Normocephalic and atraumatic.  Eyes: Conjunctivae and EOM are normal. Pupils are equal, round, and reactive to light.  Neck: Trachea normal. Neck supple. No thyromegaly present.  Cardiovascular: Regular rhythm, S1 normal, S2 normal and normal pulses.     No systolic murmur is present   No diastolic murmur is present  Pulses:       Radial pulses are 2+ on the right side, and 2+ on the left side.  Borderline tachycardic  Pulmonary/Chest: Effort normal and breath sounds normal. She has no wheezes. She has no rhonchi. She has no rales. She exhibits no tenderness.  Abdominal: Soft. Normal appearance and bowel sounds are normal. There is no tenderness. There is no CVA tenderness and negative Murphy's sign.  Musculoskeletal:  Calves nontender, no cords or erythema, negative Homans sign  Neurological: She is alert and oriented to person, place, and time. She has normal strength. No cranial nerve deficit or sensory deficit. GCS eye subscore is 4. GCS verbal subscore is 5. GCS motor subscore is 6.  Skin: Skin is warm and dry. No rash noted. She is not diaphoretic.  Psychiatric: Her speech is normal.  Cooperative and appropriate    ED Course  Procedures (including critical care time) Labs Review Labs Reviewed  CBC - Abnormal; Notable for the following:    WBC 12.0 (*)    All other components within normal limits  BASIC METABOLIC PANEL - Abnormal; Notable for the following:    Glucose, Bld 103 (*)    All other components within normal limits  PRO B NATRIURETIC PEPTIDE - Abnormal; Notable for the following:    Pro B Natriuretic peptide (BNP) 141.2 (*)    All other components within normal limits  D-DIMER, QUANTITATIVE  POCT I-STAT TROPONIN I   Imaging Review Dg Chest Portable 1 View  09/02/2013   CLINICAL DATA:  Chest pain into right shoulder and jaw  EXAM: PORTABLE CHEST - 1 VIEW  COMPARISON:  None.  FINDINGS: The heart size and mediastinal contours are within normal limits. Both lungs are clear. The visualized skeletal structures are unremarkable.  IMPRESSION: No active disease.   Electronically Signed   By: Tiburcio Pea   On: 09/02/2013 03:04    Date: 09/02/2013  Rate: 101  Rhythm: sinus tachycardia  QRS Axis: normal  Intervals: normal  ST/T Wave abnormalities: nonspecific ST/T changes  Conduction  Disutrbances:none  Narrative Interpretation: Sinus tach with inferior T-wave inversions  Old EKG Reviewed: none available  Aspirin. Nitroglycerin. Morphine.  4:28 AM d/w DR Izola Price, d-dimer pending, plan admit MDM  Diagnosis: Chest pain, abnormal EKG  Chest x-ray and labs reviewed as above Medicine consult for admission   Sunnie Nielsen, MD 09/02/13 [phone]

## 2013-09-02 NOTE — H&P (Signed)
[facility] History and Physical  Andrea [name] ZOX:[id number] DOB: [birth year] DOA: 09/02/2013  Referring physician: ED physician PCP: Leo Grosser, MD   Chief Complaint: chest pain   HPI:  Pt is 42 yo female presenting to [facility] ED with main concern of sudden onset substernal chest pain, intermittent and pressure like, 7/10 in severity and occasionally but not consistently radiating to the left neck and arm area, with no specific alleviating or aggravating factors, no specific associated symptoms, no similar events in the past. Pt denies abdominal or urinary concerns, no shortness of breath, no fevers, chill, other systemic concerns. [facility] asked to admit for chest pain work up. Pt stable in ED, trop negative, unremarkable CBC and BMP.  Assessment and Plan: Chest pain  - unclear etiology at this time - will admit to telemetry bed and will start with providing supportive care: analgesia and oxygen as needed - place on NTG SL and check 12 lead EKG - cycle CE's, check UDS, TSH - check A1C, lipid panel, monitor BP per protocol - consider cardio consult in AM if pt with persistent pain   Code Status: Full Family Communication: Pt at bedside Disposition Plan: Admit to telemetry bed   Review of Systems:  Constitutional: Negative for fever, chills and malaise/fatigue. Negative for diaphoresis.  HENT: Negative for hearing loss, ear pain, nosebleeds, congestion, sore throat, neck pain, tinnitus and ear discharge.   Eyes: Negative for blurred vision, double vision, photophobia, pain, discharge and redness.  Respiratory: Negative for cough, hemoptysis, sputum production, shortness of breath, wheezing and stridor.   Cardiovascular: Negative for palpitations, orthopnea, claudication and leg swelling.  Gastrointestinal: Negative for nausea, vomiting and abdominal pain. Negative for heartburn, constipation, blood in stool and melena.  Genitourinary: Negative for dysuria, urgency,  frequency, hematuria and flank pain.  Musculoskeletal: Negative for myalgias, back pain, joint pain and falls.  Skin: Negative for itching and rash.  Neurological: Negative for dizziness and weakness. Negative for tingling, tremors, sensory change, speech change, focal weakness, loss of consciousness and headaches.  Endo/Heme/Allergies: Negative for environmental allergies and polydipsia. Does not bruise/bleed easily.  Psychiatric/Behavioral: Negative for suicidal ideas. The patient is not nervous/anxious.      Past Medical History  Diagnosis Date  . Epicondylitis, lateral     Right  . Migraine headache   . Hyperthyroidism   . B12 deficiency   . IBS (irritable bowel syndrome)     Past Surgical History  Procedure Laterality Date  . Appendectomy    . Breast reduction surgery      Social History:  reports that she has never smoked. She does not have any smokeless tobacco history on file. She reports that she does not drink alcohol or use illicit drugs.  Allergies  Allergen Reactions  . Ceftin [Cefuroxime Axetil] Rash  . Sulfa Antibiotics Rash    No known family medical history   Prior to Admission medications   Medication Sig Start Date End Date Taking? Authorizing Provider  albuterol (PROVENTIL HFA;VENTOLIN HFA) 108 (90 BASE) MCG/ACT inhaler Inhale 2 puffs into the lungs every 6 (six) hours as needed for wheezing.   Yes Historical Provider, MD  aspirin-acetaminophen-caffeine (EXCEDRIN MIGRAINE) [phone] MG per tablet Take 2 tablets by mouth every 6 (six) hours as needed for pain.   Yes Historical Provider, MD  butalbital-acetaminophen-caffeine (FIORICET WITH CODEINE) 50-325-40-30 MG per capsule Take 1 capsule by mouth every 4 (four) hours as needed for migraine.    Yes Historical Provider, MD  HYDROcodone-acetaminophen (NORCO/VICODIN) 5-325  MG per tablet Take 1 tablet by mouth every 6 (six) hours as needed for pain (PRN Migraine).   Yes Historical Provider, MD  ibuprofen  (ADVIL,MOTRIN) 200 MG tablet Take 600 mg by mouth every 6 (six) hours as needed for pain.   Yes Historical Provider, MD  Linaclotide (LINZESS) 290 MCG CAPS Take 290 mcg by mouth every other day.    Yes Historical Provider, MD  norgestimate-ethinyl estradiol (SPRINTEC 28) 0.25-35 MG-MCG tablet Take 1 tablet by mouth daily.   Yes Historical Provider, MD  promethazine (PHENERGAN) 25 MG tablet Take 1 tablet (25 mg total) by mouth every 4 (four) hours. 06/06/13  Yes Donita Brooks, MD  Saline 0.2 % SOLN Place 1 spray into both nostrils as needed (dryness).   Yes Historical Provider, MD  topiramate (TOPAMAX) 25 MG tablet Take 25 mg by mouth at bedtime.   Yes Historical Provider, MD  Vitamin D, Ergocalciferol, (DRISDOL) 50000 UNITS CAPS capsule Take 50,000 Units by mouth every 7 (seven) days.   Yes Historical Provider, MD    Physical Exam: Filed Vitals:   09/02/13 0115 09/02/13 0300  BP: 126/67 104/80  Pulse: 104 96  Temp: 98.7 F (37.1 C)   TempSrc: Oral   Resp: 16 14  SpO2: 100% 99%    Physical Exam  Constitutional: Appears well-developed and well-nourished. No distress.  HENT: Normocephalic. External right and left ear normal. Oropharynx is clear and moist.  Eyes: Conjunctivae and EOM are normal. PERRLA, no scleral icterus.  Neck: Normal ROM. Neck supple. No JVD. No tracheal deviation. No thyromegaly.  CVS: RRR, S1/S2 +, no murmurs, no gallops, no carotid bruit.  Pulmonary: Effort and breath sounds normal, no stridor, rhonchi, wheezes, rales.  Abdominal: Soft. BS +,  no distension, tenderness, rebound or guarding.  Musculoskeletal: Normal range of motion. No edema and no tenderness.  Lymphadenopathy: No lymphadenopathy noted, cervical, inguinal. Neuro: Alert. Normal reflexes, muscle tone coordination. No cranial nerve deficit. Skin: Skin is warm and dry. No rash noted. Not diaphoretic. No erythema. No pallor.  Psychiatric: Normal mood and affect. Behavior, judgment, thought content  normal.   Labs on Admission:  Basic Metabolic Panel:  Recent Labs Lab 09/02/13 0119  NA 141  K 3.8  CL 105  CO2 24  GLUCOSE 103*  BUN 14  CREATININE 0.77  CALCIUM 9.4   CBC:  Recent Labs Lab 09/02/13 0119  WBC 12.0*  HGB 14.5  HCT 41.7  MCV 85.6  PLT 217   Radiological Exams on Admission: Dg Chest Portable 1 View  09/02/2013   No active disease.    EKG: Normal sinus rhythm, no ST/T wave changes  Debbora Presto, MD  [facility] Pager [phone]  If 7PM-7AM, please contact night-coverage [email or web address] Password [facility] 09/02/2013, 4:27 AM

## 2013-09-02 NOTE — Progress Notes (Signed)
Patient was admitted this morning, presented with chest pain having atypical features. It woke her up from her sleep this morning, chest pain characterized as tight and pressure-like located in the retrosternal region. She reports having a negative stress test several years ago ordered by her primary care provider. In emergent apartment she had negative cardiac enzymes, d-dimer within normal limits. During my evaluation she continues to have some chest discomfort. Repeat cardiac enzymes have remained negative. I discussed case with cardiology for possible noninvasive study tomorrow morning.  Physical examination General: She is in no acute distress, reports having mild chest pressure currently, having an intensity of 2/10. Vital signs: Blood pressure 103/73, temperature 98.4, pulse 83, respirations 16, O2 sat 90% room air Lungs: Clear to auscultation bilaterally Cardiovascular: Regular rate rhythm normal S1-S2 no murmurs gallops Abdomen: Soft nontender nondistended positive bowel sounds Extremities: No edema  Plan Chest pain: Patient presented with chest pain having atypical features. She also reports associated shortness of breath, having a negative a d-dimer in the emergency department. She has had 2 negative sets of cardiac enzymes. She does report a family history of premature coronary artery disease with her father dying of an MI at the age of 52 years old. Given her persistent chest discomfort, I have consulted cardiology as she will likely undergo a noninvasive study tomorrow morning. Will make her n.p.o., run normal saline at 75 ML's per hour.

## 2013-09-03 ENCOUNTER — Observation Stay (HOSPITAL_COMMUNITY): Payer: BC Managed Care – PPO

## 2013-09-03 DIAGNOSIS — R079 Chest pain, unspecified: Secondary | ICD-10-CM

## 2013-09-03 DIAGNOSIS — Z8249 Family history of ischemic heart disease and other diseases of the circulatory system: Secondary | ICD-10-CM

## 2013-09-03 MED ORDER — REGADENOSON 0.4 MG/5ML IV SOLN
INTRAVENOUS | Status: AC
  Administered 2013-09-03: 0.4 mg
  Filled 2013-09-03: qty 5

## 2013-09-03 MED ORDER — ACETAMINOPHEN 325 MG PO TABS
ORAL_TABLET | ORAL | Status: AC
  Filled 2013-09-03: qty 2

## 2013-09-03 MED ORDER — UNABLE TO FIND: Status: DC

## 2013-09-03 MED ORDER — TECHNETIUM TC 99M SESTAMIBI GENERIC - CARDIOLITE
10.0000 | Freq: Once | INTRAVENOUS | Status: AC | PRN
  Administered 2013-09-03: 10 via INTRAVENOUS

## 2013-09-03 MED ORDER — TECHNETIUM TC 99M SESTAMIBI GENERIC - CARDIOLITE
30.0000 | Freq: Once | INTRAVENOUS | Status: AC | PRN
  Administered 2013-09-03: 30 via INTRAVENOUS

## 2013-09-03 NOTE — Progress Notes (Signed)
*  PRELIMINARY RESULTS* Echocardiogram 2D Echocardiogram has been performed.  Andrea [name] 09/03/2013, 3:14 PM

## 2013-09-03 NOTE — Progress Notes (Signed)
DC orders received.  Patient stable with no S/S of distress.  Medication and discharge information reviewed with patient and patient's family.  Patient DC home. Andrea [name]  

## 2013-09-03 NOTE — Progress Notes (Signed)
Subjective: CP resolved.  7/10 headache.  Objective: Vital signs in last 24 hours: Temp:  [98.2 F (36.8 C)-98.7 F (37.1 C)] 98.2 F (36.8 C) (09/24 4540) Pulse Rate:  [75-87] 75 (09/24 0637) Resp:  [16-18] 18 (09/24 0637) BP: (96-103)/(52-73) 96/52 mmHg (09/24 0637) SpO2:  [97 %-99 %] 99 % (09/24 0637) Last BM Date: 09/02/13  Intake/Output from previous day: 09/23 0701 - 09/24 0700 In: 600 [P.O.:600] Out: -  Intake/Output this shift:    Medications Current Facility-Administered Medications  Medication Dose Route Frequency Provider Last Rate Last Dose  . 0.9 %  sodium chloride infusion   Intravenous Continuous Dorothea Ogle, MD 50 mL/hr at 09/02/13 0945    . 0.9 %  sodium chloride infusion   Intravenous Continuous Jeralyn Bennett, MD 75 mL/hr at 09/02/13 2110    . acetaminophen (TYLENOL) tablet 650 mg  650 mg Oral Q4H PRN Dorothea Ogle, MD      . albuterol (PROVENTIL HFA;VENTOLIN HFA) 108 (90 BASE) MCG/ACT inhaler 2 puff  2 puff Inhalation Q6H PRN Dorothea Ogle, MD      . aspirin-acetaminophen-caffeine [facility] MIGRAINE) per tablet 2 tablet  2 tablet Oral Q6H PRN Dorothea Ogle, MD      . butalbital-acetaminophen-caffeine (FIORICET, ESGIC) [phone] MG per tablet 1 tablet  1 tablet Oral Q4H PRN Dorothea Ogle, MD       And  . codeine tablet 30 mg  30 mg Oral Q4H PRN Dorothea Ogle, MD      . diphenhydrAMINE (BENADRYL) capsule 25 mg  25 mg Oral QHS PRN Jeralyn Bennett, MD   25 mg at 09/02/13 2109  . enoxaparin (LOVENOX) injection 40 mg  40 mg Subcutaneous Q24H Dorothea Ogle, MD      . HYDROcodone-acetaminophen (NORCO/VICODIN) 5-325 MG per tablet 1 tablet  1 tablet Oral Q6H PRN Dorothea Ogle, MD      . ibuprofen (ADVIL,MOTRIN) tablet 600 mg  600 mg Oral Q6H PRN Dorothea Ogle, MD      . morphine 2 MG/ML injection 2 mg  2 mg Intravenous Q2H PRN Dorothea Ogle, MD      . nitroGLYCERIN (NITROSTAT) SL tablet 0.4 mg  0.4 mg Sublingual Q5 min PRN Sunnie Nielsen, MD      .  norgestimate-ethinyl estradiol (ORTHO-CYCLEN,SPRINTEC,PREVIFEM) 0.25-35 MG-MCG tablet 1 tablet  1 tablet Oral Daily Dorothea Ogle, MD      . ondansetron [facility]) injection 4 mg  4 mg Intravenous Q6H PRN Dorothea Ogle, MD      . promethazine (PHENERGAN) tablet 25 mg  25 mg Oral Q4H PRN Dorothea Ogle, MD      . regadenoson Eugenie Birks) 0.4 MG/5ML injection SOLN           . regadenoson (LEXISCAN) injection SOLN 0.4 mg  0.4 mg Intravenous Once Brittainy Simmons, PA-C      . sodium chloride (OCEAN) 0.65 % nasal spray 1 spray  1 spray Each Nare PRN Dorothea Ogle, MD      . topiramate (TOPAMAX) tablet 25 mg  25 mg Oral QHS Dorothea Ogle, MD   25 mg at 09/02/13 2109  . Vitamin D (Ergocalciferol) (DRISDOL) capsule 50,000 Units  50,000 Units Oral Q Tue Dorothea Ogle, MD        PE: General appearance: alert, cooperative and no distress Lungs: clear to auscultation bilaterally Heart: regular rate and rhythm, S1, S2 normal, no murmur, click, rub or gallop Extremities:  No LEE Pulses: 2+ and symmetric Neurologic: Grossly normal  Lab Results:   Recent Labs  09/02/13 0119  WBC 12.0*  HGB 14.5  HCT 41.7  PLT 217   BMET  Recent Labs  09/02/13 0119  NA 141  K 3.8  CL 105  CO2 24  GLUCOSE 103*  BUN 14  CREATININE 0.77  CALCIUM 9.4   PT/INR No results found for this basename: LABPROT, INR,  in the last 72 hours Cholesterol  Recent Labs  09/02/13 1110  CHOL 160   Cardiac Panel (last 3 results)  Recent Labs  09/02/13 1110 09/02/13 1520 09/02/13 2149  TROPONINI <0.30 <0.30 <0.30   Lipid Panel     Component Value Date/Time   CHOL 160 09/02/2013 1110   TRIG 110 09/02/2013 1110   HDL 66 09/02/2013 1110   CHOLHDL 2.4 09/02/2013 1110   VLDL 22 09/02/2013 1110   LDLCALC 72 09/02/2013 1110   Studies/Results: @RISRSLT2 @   Assessment/Plan   Principal Problem:   Chest pain with low risk for cardiac etiology Active Problems:   Family history of early CAD  Plan:  Ruled out for MI.   Lipid panel looks good.  SP lexiscan myoview.  Radiologist interpretation to follow.   LOS: 1 day    Andrea [name] 09/03/2013 11:07 AM

## 2013-09-03 NOTE — Discharge Summary (Signed)
Physician Discharge Summary  Andrea [name] ZOX:[id number] DOB: February 28, 1971 DOA: 09/02/2013  PCP: Leo Grosser, MD  Admit date: 09/02/2013 Discharge date: 09/03/2013  Time spent: 45 minutes  Recommendations for Outpatient Follow-up:  1. PCP in 1 week  Discharge Diagnoses:  Principal Problem:   Atypical Chest pain with low risk for cardiac etiology Active Problems:   Family history of early CAD   Discharge Condition:stable  Diet recommendation: regular  There were no vitals filed for this visit.  History of present illness:  Pt is 42 yo female presenting to [facility] ED with main concern of sudden onset substernal chest pain, intermittent and pressure like, 7/10 in severity and occasionally but not consistently radiating to the left neck and arm area, with no specific alleviating or aggravating factors, no specific associated symptoms, no similar events in the past. Pt denies abdominal or urinary concerns, no shortness of breath, no fevers, chill, other systemic concerns. TR[facility] asked to admit for chest pain work up. Pt stable in ED, trop negative, unremarkable CBC and BMP.      Hospital Course:  Chest pain: Patient presented with chest pain having atypical features. She also reports associated shortness of breath, having a negative a d-dimer in the emergency department. She has had 2 negative sets of cardiac enzymes. She does report a family history of premature coronary artery disease with her father dying of an MI at the age of 7 years old. Given her intermittent persistent chest discomfort, cardiology was consulted, she underwent Myoview that was negative and discharged home in a stable condition.  Consultations:  [facility]  Discharge Exam: Filed Vitals:   09/03/13 1350  BP: 101/60  Pulse: 83  Temp: 97.8 F (36.6 C)  Resp:     General: AAOx3 Cardiovascular: S1S2/RRR Respiratory: CTAB  Discharge Instructions  Discharge Orders   Future Orders Complete By  Expires   Increase activity slowly  As directed        Medication List         albuterol 108 (90 BASE) MCG/ACT inhaler  Commonly known as:  PROVENTIL HFA;VENTOLIN HFA  Inhale 2 puffs into the lungs every 6 (six) hours as needed for wheezing.     aspirin-acetaminophen-caffeine 250-250-65 MG per tablet  Commonly known as:  EXCEDRIN MIGRAINE  Take 2 tablets by mouth every 6 (six) hours as needed for pain.     butalbital-acetaminophen-caffeine 50-325-40-30 MG per capsule  Commonly known as:  FIORICET WITH CODEINE  Take 1 capsule by mouth every 4 (four) hours as needed for migraine.     HYDROcodone-acetaminophen 5-325 MG per tablet  Commonly known as:  NORCO/VICODIN  Take 1 tablet by mouth every 6 (six) hours as needed for pain (PRN Migraine).     ibuprofen 200 MG tablet  Commonly known as:  ADVIL,MOTRIN  Take 600 mg by mouth every 6 (six) hours as needed for pain.     LINZESS 290 MCG Caps capsule  Generic drug:  Linaclotide  Take 290 mcg by mouth every other day.     promethazine 25 MG tablet  Commonly known as:  PHENERGAN  Take 25 mg by mouth every 4 (four) hours as needed for nausea (as needed for nausea related to migraine).     Saline 0.2 % Soln  Place 1 spray into both nostrils as needed (dryness).     SPRINTEC 28 0.25-35 MG-MCG tablet  Generic drug:  norgestimate-ethinyl estradiol  Take 1 tablet by mouth daily.     topiramate 25 MG  tablet  Commonly known as:  TOPAMAX  Take 25 mg by mouth at bedtime.     UNABLE TO FIND  This note is to excuse Ms.Hershkowitz from work due to medical illness requiring hospitalization, ok to return to work from 9/25     Vitamin D (Ergocalciferol) 50000 UNITS Caps capsule  Commonly known as:  DRISDOL  Take 50,000 Units by mouth every 7 (seven) days.       Allergies  Allergen Reactions  . Ceftin [Cefuroxime Axetil] Rash  . Sulfa Antibiotics Rash       Follow-up Information   Follow up with [facility] TOM, MD. Schedule an  appointment as soon as possible for a visit in 1 week.   Specialty:  Family Medicine   Contact information:   [address] [place] [address] [phone]        The results of significant diagnostics from this hospitalization (including imaging, microbiology, ancillary and laboratory) are listed below for reference.    Significant Diagnostic Studies: Nm Myocar Multi W/spect W/wall Motion / Ef  09/03/2013   CLINICAL DATA:  Chest pain  EXAM: MYOCARDIAL IMAGING WITH SPECT (REST AND PHARMACOLOGIC-STRESS)  GATED LEFT VENTRICULAR WALL MOTION STUDY  LEFT VENTRICULAR EJECTION FRACTION  TECHNIQUE: Standard myocardial SPECT imaging was performed after resting intravenous injection of 10 mCi Tc-17m sestamibi. Subsequently, intravenous infusion of lexiscan was performed under the supervision of the Cardiology staff. At peak effect of the drug, 30 mCi Tc-42m sestamibi was injected intravenously and standard myocardial SPECT imaging was performed. Quantitative gated imaging was also performed to evaluate left ventricular wall motion, and estimate left ventricular ejection fraction.  COMPARISON:  None.  FINDINGS: The stress SPECT images demonstrate physiologic distribution of radiopharmaceutical. Rest images demonstrate no perfusion defects.  The gated stress SPECT images demonstrate normal left ventricular myocardial thickening. No focal wall motion abnormality is seen.  Calculated left ventricular end-diastolic volume 54 mL, end-systolic volume 15 mL, ejection fraction of 72%.  IMPRESSION: 1. Negative for pharmacologic-stress induced ischemia.  2. Left ventricular ejection fraction 72%.   Electronically Signed   By: Charline Bills M.D.   On: 09/03/2013 14:45   Dg Chest Portable 1 View  09/02/2013   CLINICAL DATA:  Chest pain into right shoulder and jaw  EXAM: PORTABLE CHEST - 1 VIEW  COMPARISON:  None.  FINDINGS: The heart size and mediastinal contours are within normal limits. Both lungs are  clear. The visualized skeletal structures are unremarkable.  IMPRESSION: No active disease.   Electronically Signed   By: Tiburcio Pea   On: 09/02/2013 03:04    Microbiology: No results found for this or any previous visit (from the past 240 hour(s)).   Labs: Basic Metabolic Panel:  Recent Labs Lab 09/02/13 0119  NA 141  K 3.8  CL 105  CO2 24  GLUCOSE 103*  BUN 14  CREATININE 0.77  CALCIUM 9.4   Liver Function Tests: No results found for this basename: AST, ALT, ALKPHOS, BILITOT, PROT, ALBUMIN,  in the last 168 hours No results found for this basename: LIPASE, AMYLASE,  in the last 168 hours No results found for this basename: AMMONIA,  in the last 168 hours CBC:  Recent Labs Lab 09/02/13 0119  WBC 12.0*  HGB 14.5  HCT 41.7  MCV 85.6  PLT 217   Cardiac Enzymes:  Recent Labs Lab 09/02/13 1110 09/02/13 1520 09/02/13 2149  TROPONINI <0.30 <0.30 <0.30   BNP: BNP (last 3 results)  Recent Labs  09/02/13 0119  PROBNP 141.2*   CBG: No results found for this basename: GLUCAP,  in the last 168 hours     Signed:  Joelle Roswell  [facility] 09/03/2013, 8:35 PM

## 2013-09-04 ENCOUNTER — Telehealth: Payer: Self-pay | Admitting: Cardiology

## 2013-09-04 MED ORDER — NITROGLYCERIN 0.4 MG SL SUBL: 0.4000 mg | SUBLINGUAL_TABLET | SUBLINGUAL | Status: DC | PRN

## 2013-09-04 NOTE — Telephone Encounter (Signed)
Reviewed chart.  Pt not discharged w/ NTG, but was on hospital med list.    Returned call.  Pt stated Dr. Herbie Baltimore told her he would call in Rx for NTG b/c she needed it in case she had symptoms like she did before.  Pt informed RN reviewed chart.  Informed one-time Rx will be sent and pt will need to f/u with PCP for future refills if needed as she is not establishing care w/ our office.  Pt verbalized understanding and agreed w/ plan.  Rx sent to pharmacy.

## 2013-09-04 NOTE — Telephone Encounter (Signed)
Please call-pt was discharged from the hospital yesterday evening.Dr Herbie Baltimore told her he was going to call in her medicine at the pharmacy. Her medicine is still not there.It was for nitroglycerin.[facility] [address] is her pharmacy.

## 2013-09-09 ENCOUNTER — Ambulatory Visit (INDEPENDENT_AMBULATORY_CARE_PROVIDER_SITE_OTHER): Payer: BC Managed Care – PPO | Admitting: Family Medicine

## 2013-09-09 ENCOUNTER — Encounter: Payer: Self-pay | Admitting: Family Medicine

## 2013-09-09 VITALS — BP 108/60 | HR 78 | Temp 98.6°F | Resp 16 | Ht 60.0 in | Wt 159.0 lb

## 2013-09-09 DIAGNOSIS — R079 Chest pain, unspecified: Secondary | ICD-10-CM

## 2013-09-09 DIAGNOSIS — Z09 Encounter for follow-up examination after completed treatment for conditions other than malignant neoplasm: Secondary | ICD-10-CM

## 2013-09-09 NOTE — Progress Notes (Signed)
Subjective:    Patient ID: Andrea [name], female    DOB: Jun 21, 1971, 42 y.o.   MRN: [id number]  HPI  Patient was admitted to the hospital with chest pain. She was at work reading to a group of fifth-grader's. She is fully developed pain in the right side of her neck that radiated into her chest and down her right arm. The pain was intense and even cause shortness of breath. The pain progressed down her right arm even into her hand. That morning she had injured her neck after she struck her face on the door by accident.  The pain persisted. She went home that evening and the pain intensified. Therefore her husband took her to the hospital. Her cardiac enzymes were negative. Chest x-ray was negative. D-dimer was negative. She had a myocardial perfusion scan. The perfusion images were negative however her EKG tracings did show T-wave inversions in the inferior leads. Therefore cardiology recommended that the patient carry nitroglycerin on her person. If the chest pain return she is to take nitroglycerin and if the pain improves they need to do a catheterization. However it was felt that she most likely had musculoskeletal pain. The patient denies any gastrointestinal symptoms. She denies any anxiety attacks.  I reviewed your records and hospital records. I reviewed her stress test as well as her chest x-ray results. The patient is here today for followup. She's been home for a week and today is the first day that she has not had pain or discomfort in her chest. To date, she has not taken NTG.  Past Medical History  Diagnosis Date  . B12 deficiency   . IBS (irritable bowel syndrome)   . Shortness of breath     "all the time when I came in today" (09/02/2013)  . GERD (gastroesophageal reflux disease)   . Migraine headache     "maybe 6-8 times/yr" (09/02/2013)  . Epicondylitis, lateral     Right  . Arthritis     "hands" (09/02/2013)  . Angiolipoma of kidney    Past Surgical History  Procedure  Laterality Date  . Reduction mammaplasty Bilateral ~ 2009  . Palatal expansion  2004  . Appendectomy  1984   Current Outpatient Prescriptions on File Prior to Visit  Medication Sig Dispense Refill  . albuterol (PROVENTIL HFA;VENTOLIN HFA) 108 (90 BASE) MCG/ACT inhaler Inhale 2 puffs into the lungs every 6 (six) hours as needed for wheezing.      Marland Kitchen aspirin-acetaminophen-caffeine (EXCEDRIN MIGRAINE) 250-250-65 MG per tablet Take 2 tablets by mouth every 6 (six) hours as needed for pain.      Marland Kitchen HYDROcodone-acetaminophen (NORCO/VICODIN) 5-325 MG per tablet Take 1 tablet by mouth every 6 (six) hours as needed for pain (PRN Migraine).      Marland Kitchen ibuprofen (ADVIL,MOTRIN) 200 MG tablet Take 600 mg by mouth every 6 (six) hours as needed for pain.      . Linaclotide (LINZESS) 290 MCG CAPS Take 290 mcg by mouth every other day.       . nitroGLYCERIN (NITROSTAT) 0.4 MG SL tablet Place 1 tablet (0.4 mg total) under the tongue every 5 (five) minutes as needed for chest pain. See PCP for refills  25 tablet  0  . norgestimate-ethinyl estradiol (SPRINTEC 28) 0.25-35 MG-MCG tablet Take 1 tablet by mouth daily.      . promethazine (PHENERGAN) 25 MG tablet Take 25 mg by mouth every 4 (four) hours as needed for nausea (as needed for nausea related to migraine).      Marland Kitchen  topiramate (TOPAMAX) 25 MG tablet Take 25 mg by mouth at bedtime.      . Vitamin D, Ergocalciferol, (DRISDOL) 50000 UNITS CAPS capsule Take 50,000 Units by mouth every 7 (seven) days.      . butalbital-acetaminophen-caffeine (FIORICET WITH CODEINE) 50-325-40-30 MG per capsule Take 1 capsule by mouth every 4 (four) hours as needed for migraine.        No current facility-administered medications on file prior to visit.   Allergies  Allergen Reactions  . Ceftin [Cefuroxime Axetil] Rash  . Sulfa Antibiotics Rash   History   Social History  . Marital Status: Married    Spouse Name: N/A    Number of Children: N/A  . Years of Education: N/A    Occupational History  . Not on file.   Social History Main Topics  . Smoking status: Never Smoker   . Smokeless tobacco: Never Used  . Alcohol Use: No  . Drug Use: No  . Sexual Activity: Not Currently   Other Topics Concern  . Not on file   Social History Narrative  . No narrative on file     Review of Systems  All other systems reviewed and are negative.       Objective:   Physical Exam  Vitals reviewed. Constitutional: She is oriented to person, place, and time. She appears well-developed and well-nourished.  Neck: Normal range of motion. Neck supple. No JVD present. No thyromegaly present.  Cardiovascular: Normal rate, regular rhythm and normal heart sounds.  Exam reveals no gallop and no friction rub.   No murmur heard. Pulmonary/Chest: Effort normal and breath sounds normal. No respiratory distress. She has no wheezes. She has no rales. She exhibits no tenderness.  Abdominal: Soft. Bowel sounds are normal. She exhibits no distension and no mass. There is no tenderness. There is no rebound and no guarding.  Musculoskeletal: Normal range of motion. She exhibits no edema and no tenderness.  Lymphadenopathy:    She has no cervical adenopathy.  Neurological: She is alert and oriented to person, place, and time. She has normal reflexes. She displays normal reflexes. No cranial nerve deficit. She exhibits normal muscle tone. Coordination normal.  Skin: Skin is warm. No rash noted. No erythema. No pallor.          Assessment & Plan:  1. Hospital discharge follow-up  2. Chest pain I spent 25 minutes discussing the patient's case with her and her husband.  I believe it is unlikely that this was her heart causing her chest pain. It is possible that she may have vasospastic angina. I recommended that if the chest pain reoccurs and if she takes nitroglycerin and the pain improved she should contact the cardiologist immediately as they had planned. However given the  location of the pain and radiation down her right arm I am concerned the patient may have experienced cervical radiculopathy from possibly a herniated disc in her neck.  At the present time she is pain free and i will not proceed with an MRI. If the pain reoccurs we will repeat the MRI once we have ruled out her heart conclusively

## 2013-09-11 ENCOUNTER — Inpatient Hospital Stay: Payer: BC Managed Care – PPO | Admitting: Family Medicine

## 2013-12-12 ENCOUNTER — Telehealth: Payer: Self-pay | Admitting: Family Medicine

## 2013-12-12 MED ORDER — DOXYCYCLINE HYCLATE 100 MG PO TABS: 100.0000 mg | ORAL_TABLET | Freq: Two times a day (BID) | ORAL | Status: DC

## 2013-12-12 NOTE — Telephone Encounter (Signed)
Rx to pharmacy and patient aware 

## 2013-12-12 NOTE — Telephone Encounter (Signed)
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  States you treated her for this several month ago with Doxycycline.  Can you call in another RX?

## 2013-12-12 NOTE — Telephone Encounter (Signed)
Doxycycline 100 mg pobid for 10 day

## 2014-01-23 ENCOUNTER — Telehealth: Payer: Self-pay | Admitting: Family Medicine

## 2014-01-23 MED ORDER — OXYCODONE-ACETAMINOPHEN 10-325 MG PO TABS: 1.0000 | ORAL_TABLET | Freq: Three times a day (TID) | ORAL | Status: DC | PRN

## 2014-01-23 NOTE — Telephone Encounter (Signed)
Med ok'd by Dr. Dennard Schaumann last refill was 01/11/13 - RX printed, left up front and patient aware to pick up per vm

## 2014-01-23 NOTE — Telephone Encounter (Signed)
Message copied by Alyson Locket on Fri Jan 23, 2014  2:43 PM ------      Message from: Kristine Garbe      Created: Fri Jan 23, 2014 12:47 PM      Contact: [phone]       Needs Oxycodone RX ------

## 2014-07-01 ENCOUNTER — Telehealth: Payer: Self-pay | Admitting: Family Medicine

## 2014-07-01 NOTE — Telephone Encounter (Signed)
Message copied by Alyson Locket on Wed Jul 01, 2014  2:47 PM ------      Message from: Lenore Manner      Created: Wed Jul 01, 2014  1:47 PM      Contact: [phone]       Pt has had a stye for several days and has been using over counter medication and not getting better can she have something called in for her ------

## 2014-07-02 ENCOUNTER — Encounter: Payer: Self-pay | Admitting: Physician Assistant

## 2014-07-02 ENCOUNTER — Ambulatory Visit (INDEPENDENT_AMBULATORY_CARE_PROVIDER_SITE_OTHER): Payer: BC Managed Care – PPO | Admitting: Physician Assistant

## 2014-07-02 VITALS — BP 116/78 | HR 72 | Temp 98.6°F | Resp 18 | Wt 163.0 lb

## 2014-07-02 DIAGNOSIS — H0019 Chalazion unspecified eye, unspecified eyelid: Secondary | ICD-10-CM

## 2014-07-02 DIAGNOSIS — H0012 Chalazion right lower eyelid: Secondary | ICD-10-CM

## 2014-07-02 MED ORDER — TOBRAMYCIN 0.3 % OP OINT: 1.0000 "application " | TOPICAL_OINTMENT | Freq: Four times a day (QID) | OPHTHALMIC | Status: DC

## 2014-07-02 NOTE — Telephone Encounter (Signed)
Patient called and appt made for this afternoon

## 2014-07-02 NOTE — Progress Notes (Signed)
Patient ID: Andrea [name] MRN: [id number], DOB: February 15, 1971, 43 y.o. Date of Encounter: 07/02/2014, 3:51 PM    Chief Complaint:  Chief Complaint  Patient presents with  . stye rt eye x 3 days    getting worse     HPI: 43 y.o. year old white female says that today is the third day that she has had this problem with her eye. Says that she can feel a little bump underneath the skin of the lower eyelid of the right eye. Also when she tries to sleep her right eye feels "goo-ey".  There is no crust on the eyelids in the mornings. No other complaints.      Home Meds:   Outpatient Prescriptions Prior to Visit  Medication Sig Dispense Refill  . albuterol (PROVENTIL HFA;VENTOLIN HFA) 108 (90 BASE) MCG/ACT inhaler Inhale 2 puffs into the lungs every 6 (six) hours as needed for wheezing.      Marland Kitchen aspirin-acetaminophen-caffeine (EXCEDRIN MIGRAINE) 250-250-65 MG per tablet Take 2 tablets by mouth every 6 (six) hours as needed for pain.      . butalbital-acetaminophen-caffeine (FIORICET WITH CODEINE) 50-325-40-30 MG per capsule Take 1 capsule by mouth every 4 (four) hours as needed for migraine.       Marland Kitchen ibuprofen (ADVIL,MOTRIN) 200 MG tablet Take 600 mg by mouth every 6 (six) hours as needed for pain.      . Linaclotide (LINZESS) 290 MCG CAPS Take 290 mcg by mouth every other day.       . nitroGLYCERIN (NITROSTAT) 0.4 MG SL tablet Place 1 tablet (0.4 mg total) under the tongue every 5 (five) minutes as needed for chest pain. See PCP for refills  25 tablet  0  . norgestimate-ethinyl estradiol (SPRINTEC 28) 0.25-35 MG-MCG tablet Take 1 tablet by mouth daily.      Marland Kitchen oxyCODONE-acetaminophen (PERCOCET) 10-325 MG per tablet Take 1 tablet by mouth every 8 (eight) hours as needed for pain.  30 tablet  0  . promethazine (PHENERGAN) 25 MG tablet Take 25 mg by mouth every 4 (four) hours as needed for nausea (as needed for nausea related to migraine).      . Vitamin D, Ergocalciferol, (DRISDOL) 50000 UNITS  CAPS capsule Take 50,000 Units by mouth every 7 (seven) days.      Marland Kitchen topiramate (TOPAMAX) 25 MG tablet Take 25 mg by mouth at bedtime.      Marland Kitchen doxycycline (VIBRA-TABS) 100 MG tablet Take 1 tablet (100 mg total) by mouth 2 (two) times daily.  20 tablet  0  . HYDROcodone-acetaminophen (NORCO/VICODIN) 5-325 MG per tablet Take 1 tablet by mouth every 6 (six) hours as needed for pain (PRN Migraine).       No facility-administered medications prior to visit.    Allergies:  Allergies  Allergen Reactions  . Ceftin [Cefuroxime Axetil] Rash  . Sulfa Antibiotics Rash      Review of Systems: See HPI for pertinent ROS. All other ROS negative.    Physical Exam: Blood pressure 116/78, pulse 72, temperature 98.6 F (37 C), temperature source Oral, resp. rate 18, weight 163 lb (73.936 kg)., Body mass index is 31.83 kg/(m^2). General:  WNWD WF. Appears in no acute distress. HEENT: Normocephalic, atraumatic, nares are without discharge. Bilateral auditory canals clear, TM's are without perforation, pearly grey and translucent with reflective cone of light bilaterally. Oral cavity moist, posterior pharynx without exudate, erythema, peritonsillar abscess. Left Eye: Normal Right Eye:    Lower EyeLid: Approximately 1/2 cm from  the medial canthus, and approximately 1/3 cm inferior from the edge of the lower eyelid, there is a round papule beneath the skin, that is approx 1/3 cm diameter.  There is minimal pink color to the skin at the site. There is minimal swelling at the site. There is no erythema or swelling except just that which is localized to the site.   Neck: Supple. No thyromegaly. No lymphadenopathy. Lungs: Clear bilaterally to auscultation without wheezes, rales, or rhonchi. Breathing is unlabored. Heart: Regular rhythm. No murmurs, rubs, or gallops. Msk:  Strength and tone normal for age. Extremities/Skin: Warm and dry. Neuro: Alert and oriented X 3. Moves all extremities spontaneously. Gait is  normal. CNII-XII grossly in tact. Psych:  Responds to questions appropriately with a normal affect.     ASSESSMENT AND PLAN:  43 y.o. year old female with  1. Chalazion of right lower eyelid - tobramycin (TOBREX) 0.3 % ophthalmic ointment; Place 1 application into the right eye 4 (four) times daily.  Dispense: 3.5 g; Refill: 0  Every 4 hours while she is awake, she is to apply warm compresses to the site for several minutes. Then allow the site to dry. Then apply the above ophthalmic ointment.  If site worsens at all or does not resolve in one week then followup immediately.   [address] [place], [place], [facility] 07/02/2014 3:51 PM

## 2014-07-02 NOTE — Telephone Encounter (Signed)
NTBS.

## 2014-07-13 ENCOUNTER — Other Ambulatory Visit: Payer: Self-pay | Admitting: Family Medicine

## 2014-07-21 ENCOUNTER — Encounter: Payer: Self-pay | Admitting: Family Medicine

## 2014-07-21 ENCOUNTER — Ambulatory Visit (INDEPENDENT_AMBULATORY_CARE_PROVIDER_SITE_OTHER): Payer: BC Managed Care – PPO | Admitting: Family Medicine

## 2014-07-21 VITALS — BP 108/80 | HR 70 | Temp 98.1°F | Resp 18 | Wt 164.0 lb

## 2014-07-21 DIAGNOSIS — R079 Chest pain, unspecified: Secondary | ICD-10-CM

## 2014-07-21 MED ORDER — PANTOPRAZOLE SODIUM 40 MG PO TBEC: 40.0000 mg | DELAYED_RELEASE_TABLET | Freq: Every day | ORAL | Status: DC

## 2014-07-21 NOTE — Progress Notes (Signed)
Subjective:    [name] ID: Andrea [name], female    DOB: [birth year], 43 y.o.   MRN: [id number]  HPI Last September, [name] was admitted with atypical right-sided chest pain. At that time she had a Myoview study performed which revealed no evidence of ischemia. Chest x-ray performed at that time was normal. Saturday she was cleaning the house.  Saturday night she developed intense sharp right-sided chest pain located at the right side of the sternum. It radiated into her back. It was worse when she was supine. The radiating to her shoulder blades. It was so intense that it would take a breath. The pain has gradually improved since Saturday. She denies shortness of breath at present time. She denies angina. She denies nausea and vomiting. She has been more bloated.  She denies indigestion. She denies symptoms of biliary colic. She denies nausea vomiting or diarrhea. She denies pleurisy. She denies cough. She denies hemoptysis. EKG today in the office shows normal sinus rhythm with 89 beats per minute with normal intervals and normal axis. She does have T-wave inversions in leads 3 and aVF which are chronic and present on her EKGs from last September. Past Medical History  Diagnosis Date  . B12 deficiency   . IBS (irritable bowel syndrome)   . Shortness of breath     "all the time when I came in today" (09/02/2013)  . GERD (gastroesophageal reflux disease)   . Migraine headache     "maybe 6-8 times/yr" (09/02/2013)  . Epicondylitis, lateral     Right  . Arthritis     "hands" (09/02/2013)  . Angiolipoma of kidney    Past Surgical History  Procedure Laterality Date  . Reduction mammaplasty Bilateral ~ 2009  . Palatal expansion  2004  . Appendectomy  1984   Current Outpatient Prescriptions on File Prior to Visit  Medication Sig Dispense Refill  . albuterol (PROVENTIL HFA;VENTOLIN HFA) 108 (90 BASE) MCG/ACT inhaler Inhale 2 puffs into the lungs every 6 (six) hours as needed for wheezing.       Marland Kitchen aspirin-acetaminophen-caffeine (EXCEDRIN MIGRAINE) 250-250-65 MG per tablet Take 2 tablets by mouth every 6 (six) hours as needed for pain.      . butalbital-acetaminophen-caffeine (FIORICET WITH CODEINE) 50-325-40-30 MG per capsule Take 1 capsule by mouth every 4 (four) hours as needed for migraine.       Marland Kitchen ibuprofen (ADVIL,MOTRIN) 200 MG tablet Take 600 mg by mouth every 6 (six) hours as needed for pain.      . Linaclotide (LINZESS) 290 MCG CAPS Take 290 mcg by mouth every other day.       . nitroGLYCERIN (NITROSTAT) 0.4 MG SL tablet Place 1 tablet (0.4 mg total) under the tongue every 5 (five) minutes as needed for chest pain. See PCP for refills  25 tablet  0  . norgestimate-ethinyl estradiol (SPRINTEC 28) 0.25-35 MG-MCG tablet Take 1 tablet by mouth daily.      Marland Kitchen oxyCODONE-acetaminophen (PERCOCET) 10-325 MG per tablet Take 1 tablet by mouth every 8 (eight) hours as needed for pain.  30 tablet  0  . promethazine (PHENERGAN) 25 MG tablet Take 25 mg by mouth every 4 (four) hours as needed for nausea (as needed for nausea related to migraine).      . promethazine (PHENERGAN) 25 MG tablet TAKE 1 TABLET (25 MG TOTAL) BY MOUTH EVERY 4 (FOUR) HOURS.  30 tablet  0  . tobramycin (TOBREX) 0.3 % ophthalmic ointment Place 1 application into the  right eye 4 (four) times daily.  3.5 g  0  . Vitamin D, Ergocalciferol, (DRISDOL) 50000 UNITS CAPS capsule Take 50,000 Units by mouth every 7 (seven) days.       No current facility-administered medications on file prior to visit.   Allergies  Allergen Reactions  . Ceftin [Cefuroxime Axetil] Rash  . Sulfa Antibiotics Rash   History   Social History  . Marital Status: Married    Spouse Name: N/A    Number of Children: N/A  . Years of Education: N/A   Occupational History  . Not on file.   Social History Main Topics  . Smoking status: Never Smoker   . Smokeless tobacco: Never Used  . Alcohol Use: No  . Drug Use: No  . Sexual Activity: Not  Currently   Other Topics Concern  . Not on file   Social History Narrative  . No narrative on file      Review of Systems  All other systems reviewed and are negative.      Objective:   Physical Exam  Vitals reviewed. Constitutional: She appears well-developed and well-nourished. No distress.  HENT:  Head: Normocephalic.  Nose: Nose normal.  Mouth/Throat: Oropharynx is clear and moist. No oropharyngeal exudate.  Eyes: Conjunctivae are normal.  Neck: Neck supple. No JVD present.  Cardiovascular: Normal rate, regular rhythm, normal heart sounds and intact distal pulses.  Exam reveals no gallop.   No murmur heard. Pulmonary/Chest: Breath sounds normal. No respiratory distress. She has no wheezes. She has no rales. She exhibits tenderness.  Abdominal: Soft. Bowel sounds are normal. She exhibits no distension and no mass. There is no tenderness. There is no rebound and no guarding.  Lymphadenopathy:    She has no cervical adenopathy.  Skin: She is not diaphoretic.          Assessment & Plan:  1. Chest pain, unspecified chest pain type Differential diagnosis includes gastroesophageal reflux disease, especially given the fact the symptoms worsen when [name] was supine, costochondritis, pulmonary pathology, muscle strain/musculoskeletal chest back pain, anxiety, or biliary colic. Begin by treating gastroesophageal reflux disease protonic 40 mg by mouth daily. Recheck in one week or immediately if worse. If symptoms persist or proceed with imaging of the chest. Consider right upper quadrant ultrasound if the pain radiates lower in the right chest wall. - pantoprazole (PROTONIX) 40 MG tablet; Take 1 tablet (40 mg total) by mouth daily.  Dispense: 30 tablet; Refill: 3 - EKG 12-Lead

## 2014-10-24 IMAGING — DX DG CHEST 1V PORT
1 series · 1 of 1 positions shown · non-contrast
Comparison: None.

CLINICAL DATA: Chest pain into right shoulder and jaw

EXAM:
PORTABLE CHEST - 1 VIEW

[portable]
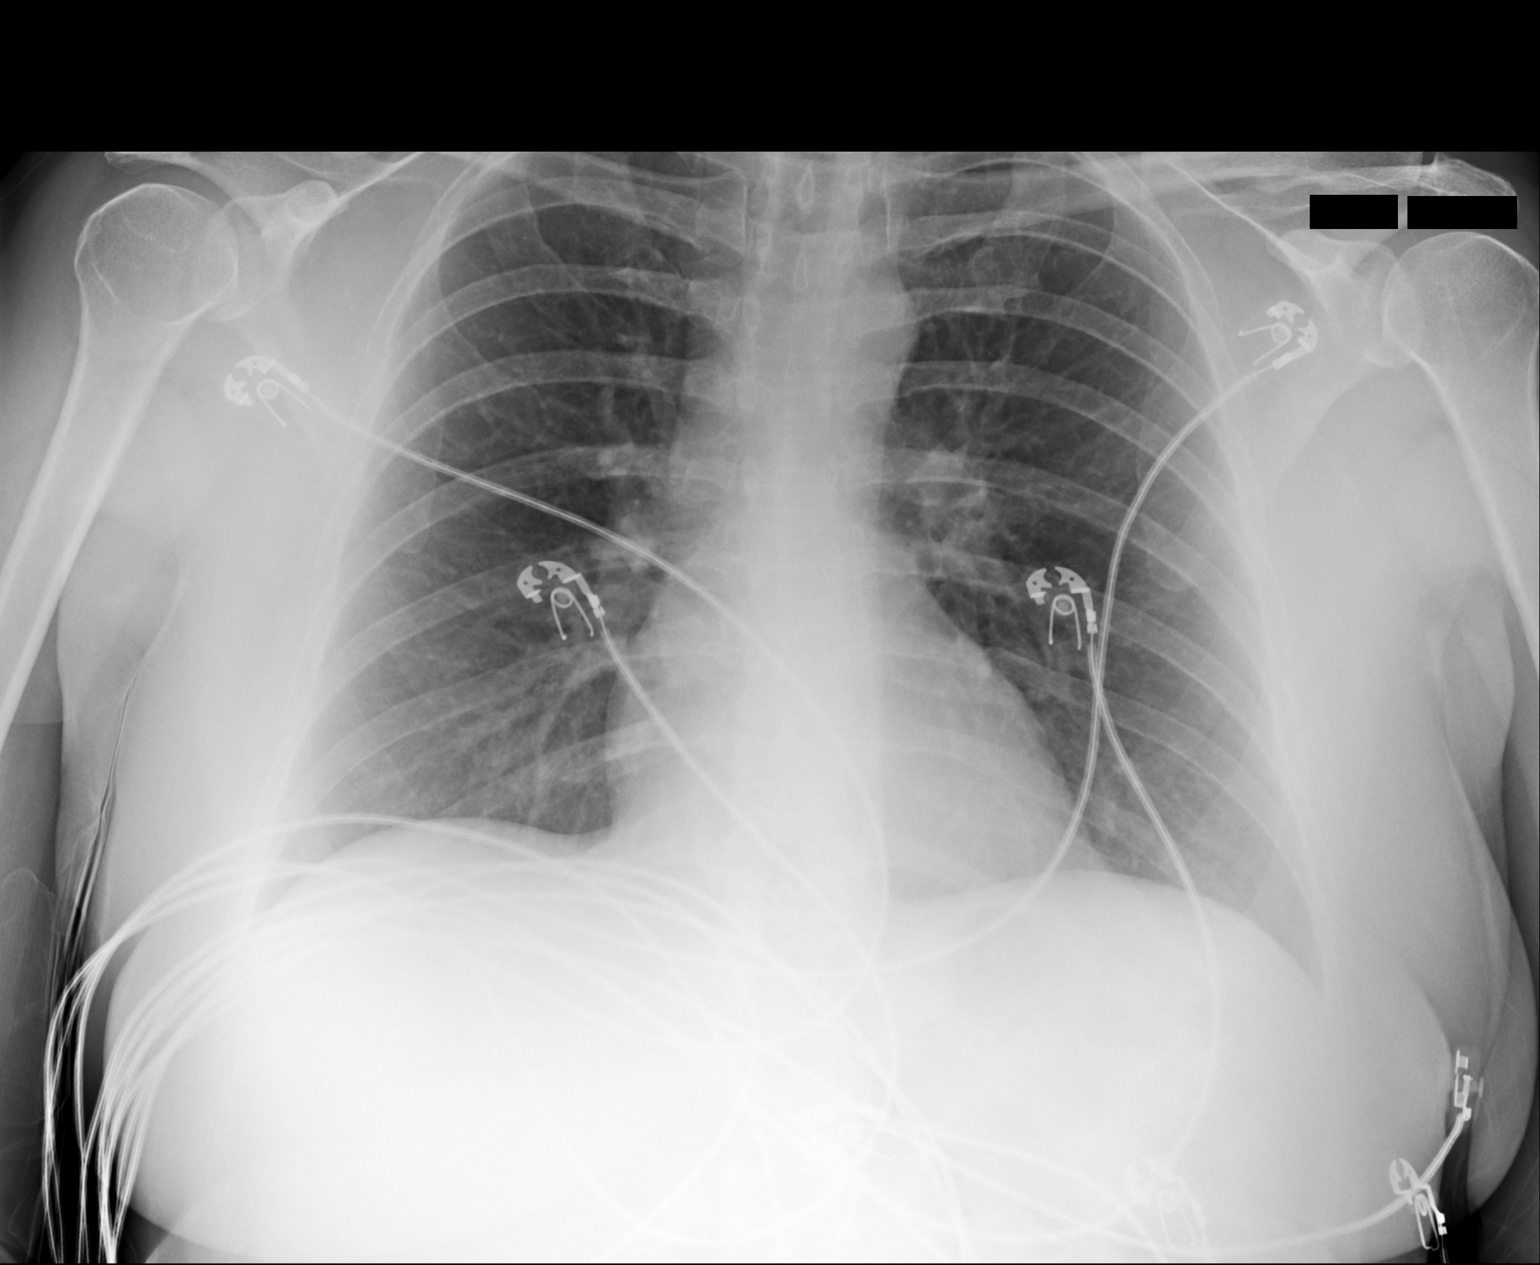

[1 of 1 positions shown; findings below may reference images not displayed]

FINDINGS: The heart size and mediastinal contours are within normal limits.
Both lungs are clear. The visualized skeletal structures are
unremarkable.
IMPRESSION: No active disease.

## 2014-11-09 ENCOUNTER — Telehealth: Payer: Self-pay | Admitting: Family Medicine

## 2014-11-09 MED ORDER — OXYCODONE-ACETAMINOPHEN 10-325 MG PO TABS: 1.0000 | ORAL_TABLET | Freq: Three times a day (TID) | ORAL | Status: DC | PRN

## 2014-11-09 NOTE — Telephone Encounter (Signed)
ok 

## 2014-11-09 NOTE — Telephone Encounter (Signed)
?   OK to Refill  

## 2014-11-09 NOTE — Telephone Encounter (Signed)
Patient requesting refill on oxycodone  [phone]

## 2014-11-09 NOTE — Telephone Encounter (Signed)
RX printed, left up front and patient aware to pick up via vm 

## 2014-11-20 ENCOUNTER — Ambulatory Visit (INDEPENDENT_AMBULATORY_CARE_PROVIDER_SITE_OTHER): Payer: BC Managed Care – PPO | Admitting: Family Medicine

## 2014-11-20 ENCOUNTER — Encounter: Payer: Self-pay | Admitting: Family Medicine

## 2014-11-20 VITALS — BP 118/60 | HR 68 | Temp 98.5°F | Resp 12 | Ht 61.0 in | Wt 165.0 lb

## 2014-11-20 DIAGNOSIS — M5442 Lumbago with sciatica, left side: Secondary | ICD-10-CM

## 2014-11-20 DIAGNOSIS — M5432 Sciatica, left side: Secondary | ICD-10-CM

## 2014-11-20 MED ORDER — METHYLPREDNISOLONE ACETATE 40 MG/ML IJ SUSP
40.0000 mg | Freq: Once | INTRAMUSCULAR | Status: AC
  Administered 2014-11-20: 40 mg via INTRAMUSCULAR

## 2014-11-20 MED ORDER — CYCLOBENZAPRINE HCL 10 MG PO TABS: 10.0000 mg | ORAL_TABLET | Freq: Three times a day (TID) | ORAL | Status: DC | PRN

## 2014-11-20 MED ORDER — METHYLPREDNISOLONE (PAK) 4 MG PO TABS: ORAL_TABLET | ORAL | Status: DC

## 2014-11-20 NOTE — Patient Instructions (Signed)
Take pain meds as needed Start steroids tomorrow Flexeril for muscle spasm Call if not improving by next week

## 2014-11-20 NOTE — Progress Notes (Signed)
[name] ID: Andrea [name], female   DOB: 11-17-1971, 43 y.o.   MRN: [id number]   Subjective:    [name] ID: Andrea [name], female    DOB: Dec 10, 1971, 43 y.o.   MRN: [id number]  [name] presents for L sided pain  [name] here with left-sided low back pain which started 2 days ago. She went to move a very heavy Christmas tree which had fallen over that night she began having twinges of pain the next morning she had severe pain which was Thursday and today she is still in pain. The pain is radiating down her left leg in waves feels like shocks and also goes to her groin. She did take ibuprofen and an old Flexeril which helped some. She denies any change in bowel bladder no tingling or numbness in her feet no saddle anesthesia. No history of any back pain or injury in the past.    Review Of Systems:  GEN- denies fatigue, fever, weight loss,weakness, recent illness HEENT- denies eye drainage, change in vision, nasal discharge, CVS- denies chest pain, palpitations RESP- denies SOB, cough, wheeze ABD- denies N/V, change in stools, abd pain GU- denies dysuria, hematuria, dribbling, incontinence MSK- +joint pain, +muscle aches, injury Neuro- denies headache, dizziness, syncope, seizure activity       Objective:    BP 118/60 mmHg  Pulse 68  Temp(Src) 98.5 F (36.9 C) (Oral)  Resp 12  Ht 5\' 1"  (1.549 m)  Wt 165 lb (74.844 kg)  BMI 31.19 kg/m2  LMP 09/20/2014 (Approximate) GEN- NAD, alert and oriented x3 MSK- TTP lumbar spine and left buttocks,near SI joint, + SLR down left side, decreased ROM due to pain,  Neuro- antalgic gait, normal tone, sensation in tact, DTR symmetric, motor 4+/5 due to pain left side 5/5 right side         Assessment & Plan:      Problem List Items Addressed This Visit    None    Visit Diagnoses    Sciatica, left    -  Primary    Relevant Medications       cyclobenzaprine (FLEXERIL) tablet       Methylprednisolone (MEDROL PAK) 4 mg po tab    methylPREDNISolone acetate (DEPO-MEDROL) injection 40 mg (Completed)    Left-sided low back pain with left-sided sciatica        Depo Medrol given, Medrol dosepak, flexeril, Percocet already at home she uses for migraines, I agreed to refill end of month since she will use for back, image if not improving by next week for slipped disc    Relevant Medications       cyclobenzaprine (FLEXERIL) tablet       Methylprednisolone (MEDROL PAK) 4 mg po tab       methylPREDNISolone acetate (DEPO-MEDROL) injection 40 mg (Completed)       Note: This dictation was prepared with Dragon dictation along with smaller phrase technology. Any transcriptional errors that result from this process are unintentional.

## 2015-02-05 ENCOUNTER — Other Ambulatory Visit: Payer: Self-pay | Admitting: Family Medicine

## 2015-03-18 ENCOUNTER — Ambulatory Visit (INDEPENDENT_AMBULATORY_CARE_PROVIDER_SITE_OTHER): Payer: BC Managed Care – PPO | Admitting: Physician Assistant

## 2015-03-18 ENCOUNTER — Encounter: Payer: Self-pay | Admitting: Physician Assistant

## 2015-03-18 VITALS — BP 120/84 | HR 80 | Temp 98.2°F | Resp 18 | Wt 167.0 lb

## 2015-03-18 DIAGNOSIS — M545 Low back pain, unspecified: Secondary | ICD-10-CM

## 2015-03-18 DIAGNOSIS — L239 Allergic contact dermatitis, unspecified cause: Secondary | ICD-10-CM

## 2015-03-18 DIAGNOSIS — L200 Besnier's prurigo: Secondary | ICD-10-CM | POA: Diagnosis not present

## 2015-03-18 DIAGNOSIS — Z87440 Personal history of urinary (tract) infections: Secondary | ICD-10-CM | POA: Diagnosis not present

## 2015-03-18 LAB — URINALYSIS, ROUTINE W REFLEX MICROSCOPIC
Bilirubin Urine: NEGATIVE
Glucose, UA: NEGATIVE mg/dL
Hgb urine dipstick: NEGATIVE
Ketones, ur: NEGATIVE mg/dL
Nitrite: NEGATIVE
Protein, ur: NEGATIVE mg/dL
Specific Gravity, Urine: 1.02 (ref 1.005–1.030)
Urobilinogen, UA: 0.2 mg/dL (ref 0.0–1.0)
pH: 7 (ref 5.0–8.0)

## 2015-03-18 LAB — URINALYSIS, MICROSCOPIC ONLY
Casts: NONE SEEN
Crystals: NONE SEEN

## 2015-03-18 MED ORDER — PREDNISONE 20 MG PO TABS: ORAL_TABLET | ORAL | Status: DC

## 2015-03-18 NOTE — Progress Notes (Signed)
Patient ID: Andrea [name] MRN: [id number], DOB: November 23, 1971, 44 y.o. Date of Encounter: 03/18/2015, 3:41 PM    Chief Complaint:  Chief Complaint  Patient presents with  . face puffy,itchy on left side  . c/o poss UTI    has LBP which for her could br UTI     HPI: 44 y.o. year old white female says that last night she developed itching of her face. Says that today her left cheek and chin has felt extremely itchy and she has been trying to avoid scratching it. those areas have also been a little bit swollen and pink in appearance--with some urticaria. Says that this happened to her in the past and it got a lot worse before she came in so she wanted to go ahead and come get it treated before it worsened. Says that she has used no new moisturizers sunscreens makeup etc. Can not think of anything different that has come in contact with this area of skin. Does not recall what caused the episode to happen several years ago in the past.  Also, wanted to check to make sure she doesn't have a urine infection. Says that she has had some bilateral low back pain over the last couple weeks off and on and wanted to check urine  while she is here to make sure.     Home Meds:   Outpatient Prescriptions Prior to Visit  Medication Sig Dispense Refill  . albuterol (PROVENTIL HFA;VENTOLIN HFA) 108 (90 BASE) MCG/ACT inhaler Inhale 2 puffs into the lungs every 6 (six) hours as needed for wheezing.    Marland Kitchen aspirin-acetaminophen-caffeine (EXCEDRIN MIGRAINE) 250-250-65 MG per tablet Take 2 tablets by mouth every 6 (six) hours as needed for pain.    . cyclobenzaprine (FLEXERIL) 10 MG tablet Take 1 tablet (10 mg total) by mouth 3 (three) times daily as needed for muscle spasms. 30 tablet 0  . ibuprofen (ADVIL,MOTRIN) 200 MG tablet Take 600 mg by mouth every 6 (six) hours as needed for pain.    . nitroGLYCERIN (NITROSTAT) 0.4 MG SL tablet Place 1 tablet (0.4 mg total) under the tongue every 5 (five) minutes as  needed for chest pain. See PCP for refills 25 tablet 0  . promethazine (PHENERGAN) 25 MG tablet Take 25 mg by mouth every 4 (four) hours as needed for nausea (as needed for nausea related to migraine).    . norgestimate-ethinyl estradiol (SPRINTEC 28) 0.25-35 MG-MCG tablet Take 1 tablet by mouth daily.    . Linaclotide (LINZESS) 290 MCG CAPS Take 290 mcg by mouth every other day.     . pantoprazole (PROTONIX) 40 MG tablet Take 1 tablet (40 mg total) by mouth daily. (Patient not taking: Reported on 11/20/2014) 30 tablet 3  . Vitamin D, Ergocalciferol, (DRISDOL) 50000 UNITS CAPS capsule Take 50,000 Units by mouth every 7 (seven) days.    . methylPREDNIsolone (MEDROL DOSPACK) 4 MG tablet follow package directions 21 tablet 0  . promethazine (PHENERGAN) 25 MG tablet TAKE 1 TABELT BY MOUTH EVERY 4 HOURS 30 tablet 0   No facility-administered medications prior to visit.    Allergies:  Allergies  Allergen Reactions  . Ceftin [Cefuroxime Axetil] Rash  . Sulfa Antibiotics Rash      Review of Systems: See HPI for pertinent ROS. All other ROS negative.    Physical Exam: Blood pressure 120/84, pulse 80, temperature 98.2 F (36.8 C), temperature source Oral, resp. rate 18, weight 167 lb (75.751 kg)., Body mass index is  31.57 kg/(m^2). General:  WNWD WF. Appears in no acute distress. Neck: Supple. No thyromegaly. No lymphadenopathy. Lungs: Clear bilaterally to auscultation without wheezes, rales, or rhonchi. Breathing is unlabored. Heart: Regular rhythm. No murmurs, rubs, or gallops. Msk:  Strength and tone normal for 44. Skin: Left Cheek and Chin with very minimal swelling and light pink erythema. No other areas of rash. Neuro: Alert and oriented X 3. Moves all extremities spontaneously. Gait is normal. CNII-XII grossly in tact. Psych:  Responds to questions appropriately with a normal affect.     ASSESSMENT AND PLAN:  44 y.o. year old female with  1. Allergic dermatitis - predniSONE  (DELTASONE) 20 MG tablet; Take 3 daily for 2 days, then 2 daily for 2 days, then 1 daily for 2 days.  Dispense: 12 tablet; Refill: 0  2. Complains of low back pain Reassured her that urinalysis is normal. Told that her low back pain is musculoskeletal and recommend that she use her Flexeril for this. - Urinalysis, Routine w reflex microscopic  3. Hx: UTI (urinary tract infection) - Urinalysis, Routine w reflex microscopic    Signed, [address] [place], [place], [facility] 03/18/2015 3:41 PM

## 2015-03-29 ENCOUNTER — Telehealth: Payer: Self-pay | Admitting: Family Medicine

## 2015-03-29 MED ORDER — OXYCODONE-ACETAMINOPHEN 10-325 MG PO TABS: 1.0000 | ORAL_TABLET | Freq: Three times a day (TID) | ORAL | Status: DC | PRN

## 2015-03-29 NOTE — Telephone Encounter (Signed)
Is she taking this for sciatica?  [place] with 30.

## 2015-03-29 NOTE — Telephone Encounter (Signed)
No she takes for her migraines -RX printed, left up front and patient aware to pick up    Is she taking this for sciatica?  [place] with 30.

## 2015-03-29 NOTE — Telephone Encounter (Signed)
[phone] PT is needing a refill on oxyCODONE-acetaminophen (PERCOCET) 10-325 MG per tablet

## 2015-03-29 NOTE — Telephone Encounter (Signed)
?   OK to Refill  

## 2015-06-21 ENCOUNTER — Encounter: Payer: Self-pay | Admitting: Family Medicine

## 2015-06-21 ENCOUNTER — Ambulatory Visit (INDEPENDENT_AMBULATORY_CARE_PROVIDER_SITE_OTHER): Payer: BC Managed Care – PPO | Admitting: Family Medicine

## 2015-06-21 ENCOUNTER — Other Ambulatory Visit: Payer: Self-pay | Admitting: Family Medicine

## 2015-06-21 VITALS — BP 126/74 | HR 88 | Temp 98.0°F | Resp 18 | Ht 60.0 in | Wt 163.0 lb

## 2015-06-21 DIAGNOSIS — R5382 Chronic fatigue, unspecified: Secondary | ICD-10-CM | POA: Diagnosis not present

## 2015-06-21 LAB — COMPLETE METABOLIC PANEL WITH GFR
ALT: 16 U/L (ref 0–35)
AST: 17 U/L (ref 0–37)
Albumin: 4.4 g/dL (ref 3.5–5.2)
Alkaline Phosphatase: 69 U/L (ref 39–117)
BILIRUBIN TOTAL: 0.5 mg/dL (ref 0.2–1.2)
BUN: 17 mg/dL (ref 6–23)
CALCIUM: 9.3 mg/dL (ref 8.4–10.5)
CHLORIDE: 106 meq/L (ref 96–112)
CO2: 23 mEq/L (ref 19–32)
CREATININE: 0.73 mg/dL (ref 0.50–1.10)
GLUCOSE: 91 mg/dL (ref 70–99)
POTASSIUM: 4.1 meq/L (ref 3.5–5.3)
SODIUM: 140 meq/L (ref 135–145)
TOTAL PROTEIN: 6.5 g/dL (ref 6.0–8.3)

## 2015-06-21 LAB — CBC WITH DIFFERENTIAL/PLATELET
BASOS ABS: 0.1 10*3/uL (ref 0.0–0.1)
Basophils Relative: 1 % (ref 0–1)
Eosinophils Absolute: 0.1 10*3/uL (ref 0.0–0.7)
Eosinophils Relative: 1 % (ref 0–5)
HEMATOCRIT: 42.5 % (ref 36.0–46.0)
Hemoglobin: 14 g/dL (ref 12.0–15.0)
LYMPHS ABS: 1.5 10*3/uL (ref 0.7–4.0)
Lymphocytes Relative: 25 % (ref 12–46)
MCH: 27.9 pg (ref 26.0–34.0)
MCHC: 32.9 g/dL (ref 30.0–36.0)
MCV: 84.7 fL (ref 78.0–100.0)
MONO ABS: 0.3 10*3/uL (ref 0.1–1.0)
MONOS PCT: 5 % (ref 3–12)
MPV: 10.6 fL (ref 8.6–12.4)
Neutro Abs: 4.1 10*3/uL (ref 1.7–7.7)
Neutrophils Relative %: 68 % (ref 43–77)
Platelets: 251 10*3/uL (ref 150–400)
RBC: 5.02 MIL/uL (ref 3.87–5.11)
RDW: 13.3 % (ref 11.5–15.5)
WBC: 6.1 10*3/uL (ref 4.0–10.5)

## 2015-06-21 LAB — VITAMIN B12: Vitamin B-12: 300 pg/mL (ref 211–911)

## 2015-06-21 LAB — SEDIMENTATION RATE: SED RATE: 4 mm/h (ref 0–20)

## 2015-06-21 LAB — TSH: TSH: 2.909 u[IU]/mL (ref 0.350–4.500)

## 2015-06-21 MED ORDER — LINACLOTIDE 290 MCG PO CAPS
290.0000 ug | ORAL_CAPSULE | ORAL | Status: DC
Start: 2015-06-21 — End: 2016-07-06

## 2015-06-21 NOTE — Progress Notes (Signed)
Subjective:    [name] ID: Andrea [name], female    DOB: 07/24/1971, 44 y.o.   MRN: [id number]  HPI [name] is a very pleasant 44 year old white female who presents today complaining of feeling extremely fatigued. Symptoms had an insidious onset. She denies any fevers or chills. She denies any weight loss. She denies any chest pain shortness of breath or dyspnea on exertion. She denies any hemoptysis or abdominal pain. She denies any melanoma or hematochezia. She does complain of some stiffness and pain in her hands and other joints. She denies any tick bites or rashes. Rest. Her mother-in-law has recently been diagnosed with pancreatic cancer. Her neighbor's son just died suddenly. She is going to stress of raising a teenage daughter. All these things may be contributing. She denies any depression. She denies any symptoms of sleep apnea Past Medical History  Diagnosis Date  . B12 deficiency   . IBS (irritable bowel syndrome)   . Shortness of breath     "all the time when I came in today" (09/02/2013)  . GERD (gastroesophageal reflux disease)   . Migraine headache     "maybe 6-8 times/yr" (09/02/2013)  . Epicondylitis, lateral     Right  . Arthritis     "hands" (09/02/2013)  . Angiolipoma of kidney    Past Surgical History  Procedure Laterality Date  . Reduction mammaplasty Bilateral ~ 2009  . Palatal expansion  2004  . Appendectomy  1984   Current Outpatient Prescriptions on File Prior to Visit  Medication Sig Dispense Refill  . albuterol (PROVENTIL HFA;VENTOLIN HFA) 108 (90 BASE) MCG/ACT inhaler Inhale 2 puffs into the lungs every 6 (six) hours as needed for wheezing.    Marland Kitchen aspirin-acetaminophen-caffeine (EXCEDRIN MIGRAINE) 250-250-65 MG per tablet Take 2 tablets by mouth every 6 (six) hours as needed for pain.    Marland Kitchen CAMILA 0.35 MG tablet Take 1 tablet by mouth daily.  3  . cyclobenzaprine (FLEXERIL) 10 MG tablet Take 1 tablet (10 mg total) by mouth 3 (three) times daily as needed  for muscle spasms. 30 tablet 0  . ibuprofen (ADVIL,MOTRIN) 200 MG tablet Take 600 mg by mouth every 6 (six) hours as needed for pain.    . Linaclotide (LINZESS) 290 MCG CAPS Take 290 mcg by mouth every other day.     . nitroGLYCERIN (NITROSTAT) 0.4 MG SL tablet Place 1 tablet (0.4 mg total) under the tongue every 5 (five) minutes as needed for chest pain. See PCP for refills 25 tablet 0  . oxyCODONE-acetaminophen (PERCOCET) 10-325 MG per tablet Take 1 tablet by mouth every 8 (eight) hours as needed for pain (for migraines). 30 tablet 0  . pantoprazole (PROTONIX) 40 MG tablet Take 1 tablet (40 mg total) by mouth daily. 30 tablet 3  . promethazine (PHENERGAN) 25 MG tablet Take 25 mg by mouth every 4 (four) hours as needed for nausea (as needed for nausea related to migraine).    . Vitamin D, Ergocalciferol, (DRISDOL) 50000 UNITS CAPS capsule Take 50,000 Units by mouth every 7 (seven) days.     No current facility-administered medications on file prior to visit.   Allergies  Allergen Reactions  . Ceftin [Cefuroxime Axetil] Rash  . Sulfa Antibiotics Rash   History   Social History  . Marital Status: Married    Spouse Name: N/A  . Number of Children: N/A  . Years of Education: N/A   Occupational History  . Not on file.   Social History Main Topics  .  Smoking status: Never Smoker   . Smokeless tobacco: Never Used  . Alcohol Use: No  . Drug Use: No  . Sexual Activity: Not Currently   Other Topics Concern  . Not on file   Social History Narrative      Review of Systems  All other systems reviewed and are negative.      Objective:   Physical Exam  Constitutional: She appears well-developed and well-nourished.  Neck: Neck supple. No thyromegaly present.  Cardiovascular: Normal rate, regular rhythm and normal heart sounds.   No murmur heard. Pulmonary/Chest: Effort normal and breath sounds normal. No respiratory distress. She has no wheezes.  Abdominal: Soft. Bowel sounds  are normal. She exhibits no distension. There is no tenderness. There is no rebound and no guarding.  Musculoskeletal: She exhibits no edema.  Lymphadenopathy:    She has no cervical adenopathy.  Skin: No rash noted.  Vitals reviewed.         Assessment & Plan:  Chronic fatigue - Plan: TSH, CBC with Differential/Platelet, COMPLETE METABOLIC PANEL WITH GFR, Sedimentation rate, Vitamin B12  [name]'s exam today is completely normal. I believe the fatigue is related to stress. However we will check a TSH, CBC, CMP, sedimentation rate, and a vitamin B12 level. In the past [name] did have subclinical hypothyroidism. If lab work is normal I will attribute her symptoms to stress

## 2015-07-27 ENCOUNTER — Telehealth: Payer: Self-pay | Admitting: Family Medicine

## 2015-07-27 NOTE — Telephone Encounter (Signed)
Patient calling to ask some questions about her labs  [phone]

## 2015-07-27 NOTE — Telephone Encounter (Signed)
Its possible but I would expect that only if her periods are becoming more sporadic and irregular.

## 2015-07-27 NOTE — Telephone Encounter (Signed)
LMTRC

## 2015-07-27 NOTE — Telephone Encounter (Signed)
Pt called and states that she is aware of her BW but she is still having symptoms and wants to know what else she can do about them. She c/o not sleeping, fatigue, hot flashes and wondering if it could be menopause?

## 2015-07-28 NOTE — Telephone Encounter (Signed)
Pt aware and recommended appt and appt made

## 2015-07-30 ENCOUNTER — Encounter: Payer: Self-pay | Admitting: Family Medicine

## 2015-07-30 ENCOUNTER — Ambulatory Visit (INDEPENDENT_AMBULATORY_CARE_PROVIDER_SITE_OTHER): Payer: BC Managed Care – PPO | Admitting: Family Medicine

## 2015-07-30 VITALS — BP 100/62 | HR 76 | Temp 98.7°F | Resp 14 | Ht 60.0 in | Wt 168.0 lb

## 2015-07-30 DIAGNOSIS — G4700 Insomnia, unspecified: Secondary | ICD-10-CM | POA: Diagnosis not present

## 2015-07-30 MED ORDER — ALPRAZOLAM 0.5 MG PO TABS: 0.5000 mg | ORAL_TABLET | Freq: Every day | ORAL | Status: DC

## 2015-07-30 NOTE — Progress Notes (Signed)
Subjective:    [name] ID: Andrea [name], female    DOB: 08-Apr-1971, 44 y.o.   MRN: [id number]  HPI  Please see [name]'s physical exam in July. [name] continues to complain of fatigue. She also complains of joint pains in her hands and in her back. Her primary concern nose the fatigue. She states she is not sleeping at night. She has a difficult time falling asleep. When she does fall asleep she easily awakens. She is only getting a couple hours of sleep every night. She believes this may be contributing to her fatigue. This seems to coincide with increased levels of stress in her life. Her mother-in-law passed away this summer from pancreatic cancer. She is now having to care for her sister-in-law and nephew. She is also under stress at home and with work. Past Medical History  Diagnosis Date  . B12 deficiency   . IBS (irritable bowel syndrome)   . Shortness of breath     "all the time when I came in today" (09/02/2013)  . GERD (gastroesophageal reflux disease)   . Migraine headache     "maybe 6-8 times/yr" (09/02/2013)  . Epicondylitis, lateral     Right  . Arthritis     "hands" (09/02/2013)  . Angiolipoma of kidney    Past Surgical History  Procedure Laterality Date  . Reduction mammaplasty Bilateral ~ 2009  . Palatal expansion  2004  . Appendectomy  1984   Current Outpatient Prescriptions on File Prior to Visit  Medication Sig Dispense Refill  . albuterol (PROVENTIL HFA;VENTOLIN HFA) 108 (90 BASE) MCG/ACT inhaler Inhale 2 puffs into the lungs every 6 (six) hours as needed for wheezing.    Marland Kitchen aspirin-acetaminophen-caffeine (EXCEDRIN MIGRAINE) 250-250-65 MG per tablet Take 2 tablets by mouth every 6 (six) hours as needed for pain.    Marland Kitchen CAMILA 0.35 MG tablet Take 1 tablet by mouth daily.  3  . cyclobenzaprine (FLEXERIL) 10 MG tablet Take 1 tablet (10 mg total) by mouth 3 (three) times daily as needed for muscle spasms. 30 tablet 0  . ibuprofen (ADVIL,MOTRIN) 200 MG tablet  Take 600 mg by mouth every 6 (six) hours as needed for pain.    . Linaclotide (LINZESS) 290 MCG CAPS capsule Take 1 capsule (290 mcg total) by mouth every other day. 30 capsule 11  . nitroGLYCERIN (NITROSTAT) 0.4 MG SL tablet Place 1 tablet (0.4 mg total) under the tongue every 5 (five) minutes as needed for chest pain. See PCP for refills 25 tablet 0  . oxyCODONE-acetaminophen (PERCOCET) 10-325 MG per tablet Take 1 tablet by mouth every 8 (eight) hours as needed for pain (for migraines). 30 tablet 0  . pantoprazole (PROTONIX) 40 MG tablet Take 1 tablet (40 mg total) by mouth daily. 30 tablet 3  . promethazine (PHENERGAN) 25 MG tablet Take 25 mg by mouth every 4 (four) hours as needed for nausea (as needed for nausea related to migraine).    . Vitamin D, Ergocalciferol, (DRISDOL) 50000 UNITS CAPS capsule Take 50,000 Units by mouth every 7 (seven) days.     No current facility-administered medications on file prior to visit.   Allergies  Allergen Reactions  . Ceftin [Cefuroxime Axetil] Rash  . Sulfa Antibiotics Rash   Social History   Social History  . Marital Status: Married    Spouse Name: N/A  . Number of Children: N/A  . Years of Education: N/A   Occupational History  . Not on file.   Social  History Main Topics  . Smoking status: Never Smoker   . Smokeless tobacco: Never Used  . Alcohol Use: No  . Drug Use: No  . Sexual Activity: Not Currently   Other Topics Concern  . Not on file   Social History Narrative     Review of Systems  All other systems reviewed and are negative.      Objective:   Physical Exam  Cardiovascular: Normal rate, regular rhythm and normal heart sounds.   Pulmonary/Chest: Effort normal and breath sounds normal.  Vitals reviewed.         Assessment & Plan:  Insomnia - Plan: ALPRAZolam (XANAX) 0.5 MG tablet  Lab work in July was completely normal including a sedimentation rate. I believe [name]'s symptoms are primarily due to  anxiety and stress. I will like to do an experiment. I'll give [name] Xanax 0.5 mg, 1-2 tablets at night for insomnia. After 2 weeks and hopefully receiving restful sleep if her symptoms improve, I would then turn our attention to ways we can help her to sleep there be a safer long-term option the benzodiazepine. If not I will consider evaluating for fibromyalgia and possible Lyme disease.

## 2015-08-02 ENCOUNTER — Other Ambulatory Visit: Payer: Self-pay | Admitting: Obstetrics and Gynecology

## 2015-08-02 DIAGNOSIS — R928 Other abnormal and inconclusive findings on diagnostic imaging of breast: Secondary | ICD-10-CM

## 2015-08-04 ENCOUNTER — Ambulatory Visit
Admission: RE | Admit: 2015-08-04 | Discharge: 2015-08-04 | Disposition: A | Discharge status: Home / Self Care | Payer: BC Managed Care – PPO | Source: Ambulatory Visit | Attending: Obstetrics and Gynecology | Admitting: Obstetrics and Gynecology

## 2015-08-04 DIAGNOSIS — R928 Other abnormal and inconclusive findings on diagnostic imaging of breast: Secondary | ICD-10-CM

## 2015-08-19 ENCOUNTER — Telehealth: Payer: Self-pay | Admitting: Family Medicine

## 2015-08-19 DIAGNOSIS — W57XXXA Bitten or stung by nonvenomous insect and other nonvenomous arthropods, initial encounter: Secondary | ICD-10-CM

## 2015-08-19 DIAGNOSIS — R5383 Other fatigue: Secondary | ICD-10-CM

## 2015-08-19 NOTE — Telephone Encounter (Signed)
Calling to give update on new medication that Dr. Dennard Schaumann started her on. She can be reached after 2:45 on her cell.

## 2015-08-20 NOTE — Telephone Encounter (Signed)
LMTRC

## 2015-08-20 NOTE — Telephone Encounter (Signed)
Pt called and states that for the 1st week the xanax worked really well and pt was taking it at 7:30 at night as it made her groggy in the am but now nothing - she is back to not sleeping well and still has the aches and pains as before. She has not increased it to 2 tab at night yet as she was afraid of the side effects in the am. Any recommendations?

## 2015-08-23 ENCOUNTER — Other Ambulatory Visit: Payer: BC Managed Care – PPO

## 2015-08-23 ENCOUNTER — Other Ambulatory Visit: Payer: Self-pay | Admitting: Family Medicine

## 2015-08-23 DIAGNOSIS — W57XXXA Bitten or stung by nonvenomous insect and other nonvenomous arthropods, initial encounter: Secondary | ICD-10-CM

## 2015-08-23 DIAGNOSIS — R5383 Other fatigue: Secondary | ICD-10-CM

## 2015-08-23 MED ORDER — DULOXETINE HCL 60 MG PO CPEP: 60.0000 mg | ORAL_CAPSULE | Freq: Every day | ORAL | Status: DC

## 2015-08-23 NOTE — Telephone Encounter (Signed)
Let's try cymbalta 60 mg poqday for possible fibromyalgia and because I believe the insomnia is due to stress.  Hoepfully over the next 3-4 weeks, the symptoms will improve.  I would also like her to swing by for lyme titers to rule out lyme disease.

## 2015-08-23 NOTE — Telephone Encounter (Signed)
Patient aware, med sent to pharm and pt to stop by this afternoon for BW

## 2015-08-25 LAB — LYME ABY, WSTRN BLT IGG & IGM W/BANDS
B burgdorferi IgG Abs (IB): NEGATIVE
B burgdorferi IgM Abs (IB): NEGATIVE
LYME DISEASE 18 KD IGG: NONREACTIVE
LYME DISEASE 23 KD IGG: NONREACTIVE
LYME DISEASE 28 KD IGG: NONREACTIVE
LYME DISEASE 39 KD IGM: NONREACTIVE
LYME DISEASE 41 KD IGM: NONREACTIVE
LYME DISEASE 45 KD IGG: NONREACTIVE
Lyme Disease 23 kD IgM: NONREACTIVE
Lyme Disease 30 kD IgG: NONREACTIVE
Lyme Disease 39 kD IgG: NONREACTIVE
Lyme Disease 41 kD IgG: NONREACTIVE
Lyme Disease 58 kD IgG: NONREACTIVE
Lyme Disease 66 kD IgG: NONREACTIVE
Lyme Disease 93 kD IgG: NONREACTIVE

## 2015-09-04 ENCOUNTER — Other Ambulatory Visit: Payer: Self-pay | Admitting: Family Medicine

## 2015-09-06 NOTE — Telephone Encounter (Signed)
Medication refilled per protocol. 

## 2015-09-06 NOTE — Telephone Encounter (Signed)
?   OK to Refill  

## 2015-09-06 NOTE — Telephone Encounter (Signed)
ok 

## 2015-09-29 ENCOUNTER — Ambulatory Visit (INDEPENDENT_AMBULATORY_CARE_PROVIDER_SITE_OTHER): Payer: BC Managed Care – PPO | Admitting: Family Medicine

## 2015-09-29 ENCOUNTER — Encounter: Payer: Self-pay | Admitting: Family Medicine

## 2015-09-29 VITALS — BP 128/70 | HR 78 | Temp 98.0°F | Resp 18 | Ht 60.0 in | Wt 169.0 lb

## 2015-09-29 DIAGNOSIS — J209 Acute bronchitis, unspecified: Secondary | ICD-10-CM

## 2015-09-29 MED ORDER — AZITHROMYCIN 250 MG PO TABS: ORAL_TABLET | ORAL | Status: DC

## 2015-09-29 MED ORDER — ALBUTEROL SULFATE 108 (90 BASE) MCG/ACT IN AEPB: 2.0000 | INHALATION_SPRAY | RESPIRATORY_TRACT | Status: DC

## 2015-09-29 MED ORDER — METHYLPREDNISOLONE ACETATE 40 MG/ML IJ SUSP
40.0000 mg | Freq: Once | INTRAMUSCULAR | Status: AC
  Administered 2015-09-29: 40 mg via INTRAMUSCULAR

## 2015-09-29 MED ORDER — HYDROCOD POLST-CPM POLST ER 10-8 MG/5ML PO SUER: 5.0000 mL | Freq: Two times a day (BID) | ORAL | Status: DC | PRN

## 2015-09-29 NOTE — Progress Notes (Signed)
[name] ID: Andrea [name], female   DOB: 1971/12/04, 44 y.o.   MRN: [id number]   Subjective:    [name] ID: Andrea [name], female    DOB: 06-Aug-1971, 44 y.o.   MRN: [id number]  [name] presents for Illness  Pt here with cough with minimal productions, tightness in chest, wheezing worse at night for the past 3 days. Non smoker. Subjective fever last night with chills. Taking Mucinex BID and Nyquil with minimal improvement. Last used albuterol for Bronchitis about 2 years ago. Has underlying allergies, which were bad last week.     Review Of Systems:  GEN- denies fatigue,+ fever, weight loss,weakness, recent illness HEENT- denies eye drainage, change in vision, nasal discharge, CVS- denies chest pain, palpitations RESP- denies SOB, +cough, +wheezey Neuro- denies headache, dizziness, syncope, seizure activity       Objective:    BP 128/70 mmHg  Pulse 78  Temp(Src) 98 F (36.7 C) (Oral)  Resp 18  Ht 5' (1.524 m)  Wt 169 lb (76.658 kg)  BMI 33.01 kg/m2   GEN- NAD, alert and oriented x3 HEENT- PERRL, EOMI, non injected sclera, pink conjunctiva, MMM, oropharynx cleari TM clear bilat no effusion,  No maxillary sinus tenderness,narea clear Neck- Supple, no LAD CVS- RRR, no murmur RESP-bilat wheeze, rhonchi Right base> Left, good air movement, no retractions, normal sat EXT- No edema Pulses- Radial 2+        Assessment & Plan:      Problem List Items Addressed This Visit    None    Visit Diagnoses    Acute bronchitis, unspecified organism    -  Primary    zpak, albuterol every 4 hours as needed, cough medicine with codiene, Depo Medol IM        Note: This dictation was prepared with Dragon dictation along with smaller phrase technology. Any transcriptional errors that result from this process are unintentional.

## 2015-09-29 NOTE — Patient Instructions (Signed)
Take antibiotics Cough medicine Albuterol as needed Call if not improving F/U as needed

## 2015-09-29 NOTE — Addendum Note (Signed)
Addended by: Sheral Flow on: 09/29/2015 02:39 PM   Modules accepted: Orders

## 2015-09-30 ENCOUNTER — Other Ambulatory Visit: Payer: Self-pay | Admitting: Family Medicine

## 2015-09-30 NOTE — Telephone Encounter (Signed)
Refill appropriate and filled per protocol. 

## 2015-10-07 ENCOUNTER — Telehealth: Payer: Self-pay | Admitting: Family Medicine

## 2015-10-07 NOTE — Telephone Encounter (Signed)
Call placed to patient.   States that she has completed Z-Pack, but cough and chest congestion continue. States that cough is worse at night. Reports that she is getting up tan colored sputum after cough.   Denies fever/ chills/ body aches.   MD please advise.

## 2015-10-07 NOTE — Telephone Encounter (Signed)
To MD

## 2015-10-07 NOTE — Telephone Encounter (Signed)
I would defer to Dr. Keturah Barre tomorrow since she examined her.

## 2015-10-07 NOTE — Telephone Encounter (Signed)
Patient calling to say she is still sick and out of antibiotic  Would like to know if something else can be called in  [phone]

## 2015-10-08 MED ORDER — PREDNISONE 20 MG PO TABS: 40.0000 mg | ORAL_TABLET | Freq: Every day | ORAL | Status: DC

## 2015-10-08 NOTE — Telephone Encounter (Signed)
Pt called and aware of recommendations. Rx to [facility].  If not starting to feel better on Monday, call office and will order CXR

## 2015-10-08 NOTE — Telephone Encounter (Signed)
Continue mucinex, DM, start prednisone 40mg  once a day for 5 days, if not improving with prednisone, needs CXR on Monday

## 2015-10-08 NOTE — Telephone Encounter (Signed)
Pt calling again today.  Cough is bad at night early morning.  Chest feeling tight again.  Coughing up thick tannish looking secretion.  Has started using Mucinex again but does she need more antibiotic?

## 2015-10-14 ENCOUNTER — Ambulatory Visit
Admission: RE | Admit: 2015-10-14 | Discharge: 2015-10-14 | Disposition: A | Discharge status: Home / Self Care | Payer: BC Managed Care – PPO | Source: Ambulatory Visit | Attending: Family Medicine | Admitting: Family Medicine

## 2015-10-14 ENCOUNTER — Telehealth: Payer: Self-pay | Admitting: *Deleted

## 2015-10-14 ENCOUNTER — Encounter (INDEPENDENT_AMBULATORY_CARE_PROVIDER_SITE_OTHER): Payer: Self-pay

## 2015-10-14 DIAGNOSIS — R059 Cough, unspecified: Secondary | ICD-10-CM

## 2015-10-14 DIAGNOSIS — J209 Acute bronchitis, unspecified: Secondary | ICD-10-CM

## 2015-10-14 DIAGNOSIS — R05 Cough: Secondary | ICD-10-CM

## 2015-10-14 NOTE — Telephone Encounter (Signed)
Pt called stating she is no better and would like to go ahead with CXR, order placed and pt aware can go as walk in

## 2015-10-24 ENCOUNTER — Other Ambulatory Visit: Payer: Self-pay | Admitting: Family Medicine

## 2015-10-25 NOTE — Telephone Encounter (Signed)
?   OK to Refill - Last refill 09/06/15

## 2015-10-25 NOTE — Telephone Encounter (Signed)
Approved. # 30 + 0. 

## 2015-11-01 ENCOUNTER — Telehealth: Payer: Self-pay | Admitting: Family Medicine

## 2015-11-01 NOTE — Telephone Encounter (Signed)
?   OK to Refill  

## 2015-11-01 NOTE — Telephone Encounter (Signed)
Pt is calling for a refill of Oxycodone.  Please call [phone]

## 2015-11-02 MED ORDER — OXYCODONE-ACETAMINOPHEN 10-325 MG PO TABS: 1.0000 | ORAL_TABLET | Freq: Three times a day (TID) | ORAL | Status: DC | PRN

## 2015-11-02 NOTE — Telephone Encounter (Signed)
ok 

## 2015-11-02 NOTE — Telephone Encounter (Signed)
RX printed, left up front and patient aware to pick up after 2  

## 2015-12-02 ENCOUNTER — Ambulatory Visit (INDEPENDENT_AMBULATORY_CARE_PROVIDER_SITE_OTHER): Payer: BC Managed Care – PPO | Admitting: Physician Assistant

## 2015-12-02 ENCOUNTER — Encounter: Payer: Self-pay | Admitting: Physician Assistant

## 2015-12-02 VITALS — BP 104/68 | HR 80 | Temp 98.1°F | Resp 18 | Wt 172.0 lb

## 2015-12-02 DIAGNOSIS — B9689 Other specified bacterial agents as the cause of diseases classified elsewhere: Principal | ICD-10-CM

## 2015-12-02 DIAGNOSIS — J988 Other specified respiratory disorders: Secondary | ICD-10-CM | POA: Diagnosis not present

## 2015-12-02 MED ORDER — HYDROCOD POLST-CPM POLST ER 10-8 MG/5ML PO SUER: 5.0000 mL | Freq: Two times a day (BID) | ORAL | Status: DC | PRN

## 2015-12-02 MED ORDER — BENZONATATE 200 MG PO CAPS: 200.0000 mg | ORAL_CAPSULE | Freq: Two times a day (BID) | ORAL | Status: DC | PRN

## 2015-12-02 MED ORDER — AZITHROMYCIN 250 MG PO TABS: ORAL_TABLET | ORAL | Status: DC

## 2015-12-02 NOTE — Progress Notes (Signed)
Patient ID: ISLEY SHRIBER MRN: [id number], DOB: 01-27-1971, 44 y.o. Date of Encounter: 12/02/2015, 8:51 AM    Chief Complaint:  Chief Complaint  Patient presents with  . still with URI    congestion, barky cough     HPI: 44 y.o. year old white female is that she started getting sick Sunday. Is that she has had a lot of chest congestion and cough but also runny nose and nasal congestion. Symptoms are only getting worse. No significant sore throat. No fevers or chills.     Home Meds:   Outpatient Prescriptions Prior to Visit  Medication Sig Dispense Refill  . Albuterol Sulfate (PROAIR RESPICLICK) 123XX123 (90 BASE) MCG/ACT AEPB Inhale 2 puffs into the lungs every 4 (four) hours. 1 each 0  . ALPRAZolam (XANAX) 0.5 MG tablet TAKE 1 TABLET BY MOUTH AT BEDTIME AS NEEDED 30 tablet 2  . aspirin-acetaminophen-caffeine (EXCEDRIN MIGRAINE) [id number] MG per tablet Take 2 tablets by mouth every 6 (six) hours as needed for pain.    Marland Kitchen CAMILA 0.35 MG tablet Take 1 tablet by mouth daily.  3  . chlorpheniramine-HYDROcodone (TUSSIONEX PENNKINETIC ER) 10-8 MG/5ML SUER Take 5 mLs by mouth every 12 (twelve) hours as needed for cough. 180 mL 0  . cyclobenzaprine (FLEXERIL) 10 MG tablet Take 1 tablet (10 mg total) by mouth 3 (three) times daily as needed for muscle spasms. 30 tablet 0  . DULoxetine (CYMBALTA) 60 MG capsule Take 1 capsule (60 mg total) by mouth daily. 30 capsule 5  . ibuprofen (ADVIL,MOTRIN) 200 MG tablet Take 600 mg by mouth every 6 (six) hours as needed for pain.    . Linaclotide (LINZESS) 290 MCG CAPS capsule Take 1 capsule (290 mcg total) by mouth every other day. 30 capsule 11  . nitroGLYCERIN (NITROSTAT) 0.4 MG SL tablet Place 1 tablet (0.4 mg total) under the tongue every 5 (five) minutes as needed for chest pain. See PCP for refills 25 tablet 0  . oxyCODONE-acetaminophen (PERCOCET) 10-325 MG tablet Take 1 tablet by mouth every 8 (eight) hours as needed for pain (for migraines). 30  tablet 0  . promethazine (PHENERGAN) 25 MG tablet TAKE 1 TABELT BY MOUTH EVERY 4 HOURS 30 tablet 0  . pantoprazole (PROTONIX) 40 MG tablet Take 1 tablet (40 mg total) by mouth daily. (Patient not taking: Reported on 12/02/2015) 30 tablet 3  . Vitamin D, Ergocalciferol, (DRISDOL) 50000 UNITS CAPS capsule Take 50,000 Units by mouth every 7 (seven) days. Reported on 12/02/2015    . azithromycin (ZITHROMAX) 250 MG tablet Take 2 tablets x 1 day, then 1 tab daily for 4 days 6 tablet 0  . predniSONE (DELTASONE) 20 MG tablet Take 2 tablets (40 mg total) by mouth daily with breakfast. For 5 days only 10 tablet 0   No facility-administered medications prior to visit.    Allergies:  Allergies  Allergen Reactions  . Ceftin [Cefuroxime Axetil] Rash  . Sulfa Antibiotics Rash      Review of Systems: See HPI for pertinent ROS. All other ROS negative.    Physical Exam: Blood pressure 104/68, pulse 80, temperature 98.1 F (36.7 C), temperature source Oral, resp. rate 18, weight 172 lb (78.019 kg)., Body mass index is 33.59 kg/(m^2). General:  WNWD WF. Appears in no acute distress. HEENT: Normocephalic, atraumatic, eyes without discharge, sclera non-icteric, nares are without discharge. Bilateral auditory canals clear, TM's are without perforation. Right TM dull. Left TM clear. Oral cavity moist, posterior pharynx without exudate, erythema,  peritonsillar abscess.  Neck: Supple. No thyromegaly. No lymphadenopathy. Lungs: Clear bilaterally to auscultation without wheezes, rales, or rhonchi. Breathing is unlabored. Heart: Regular rhythm. No murmurs, rubs, or gallops. Msk:  Strength and tone normal for age. Extremities/Skin: Warm and dry. No clubbing or cyanosis. No edema. No rashes or suspicious lesions. Neuro: Alert and oriented X 3. Moves all extremities spontaneously. Gait is normal. CNII-XII grossly in tact. Psych:  Responds to questions appropriately with a normal affect.     ASSESSMENT AND PLAN:    44 y.o. year old female with  1. Bacterial respiratory infection On exam she does not have wheeze but her breath sounds are not quite completely clear. She says that she already has an albuterol inhaler from where she had bronchitis recently. Told her to use 2 puffs every 4 hours today and tomorrow and then decrease to prn dosing. Follow up if cough or breathing worsen. Take antibiotic as directed. Follow-up if symptoms do not resolve within 1 week after completion of antibiotic. After I have printed the Tussionex, she said that actually the last cough suppressant she had did not work for her and she actually was unable to sleep with that. Reviewed med list-- it was Tussionex that was Rxed at that time---  so this prescription was shredded and instead she was given Tessalon. - azithromycin (ZITHROMAX) 250 MG tablet; Day 1: Take 2 daily. Days 2-5: Take 1 daily.  Dispense: 6 tablet; Refill: 0 - chlorpheniramine-HYDROcodone (TUSSIONEX PENNKINETIC ER) 10-8 MG/5ML SUER; Take 5 mLs by mouth every 12 (twelve) hours as needed for cough.  Dispense: 140 mL; Refill: 0--SHREDDED RX - benzonatate (TESSALON) 200 MG capsule; Take 1 capsule (200 mg total) by mouth 2 (two) times daily as needed for cough.  Dispense: 20 capsule; Refill: 0   Signed, [address] [place], [place], [facility] 12/02/2015 8:51 AM

## 2015-12-03 ENCOUNTER — Telehealth: Payer: Self-pay | Admitting: Family Medicine

## 2015-12-03 MED ORDER — PREDNISONE 20 MG PO TABS: 40.0000 mg | ORAL_TABLET | Freq: Every day | ORAL | Status: DC

## 2015-12-03 NOTE — Telephone Encounter (Signed)
Patient calling to see if she can get prednisone to open her up she is not getting better from yesterday  [phone] (M)

## 2015-12-03 NOTE — Telephone Encounter (Signed)
Rx to pharmacy and left message for pt

## 2015-12-03 NOTE — Telephone Encounter (Signed)
Okay to send 40mg  daily x 5 days

## 2015-12-09 ENCOUNTER — Telehealth: Payer: Self-pay | Admitting: Family Medicine

## 2015-12-09 MED ORDER — LEVOFLOXACIN 750 MG PO TABS: 750.0000 mg | ORAL_TABLET | Freq: Every day | ORAL | Status: DC

## 2015-12-09 NOTE — Telephone Encounter (Signed)
Had office visit 12/22 and was prescribed azithromycin Z-Pak. At this time would prescribe Levaquin 750 mg 1 by mouth daily 7 days dispense #7+0 refills

## 2015-12-09 NOTE — Telephone Encounter (Signed)
Patient is calling to say that she is still not better would like to know what to do next  [phone]

## 2015-12-09 NOTE — Telephone Encounter (Signed)
RX to pharmacy and pt aware 

## 2015-12-12 HISTORY — PX: CATARACT EXTRACTION: SUR2

## 2016-01-27 ENCOUNTER — Other Ambulatory Visit: Payer: Self-pay | Admitting: Physician Assistant

## 2016-01-27 ENCOUNTER — Encounter: Payer: Self-pay | Admitting: Family Medicine

## 2016-01-27 ENCOUNTER — Encounter: Payer: Self-pay | Admitting: Physician Assistant

## 2016-01-27 ENCOUNTER — Ambulatory Visit (INDEPENDENT_AMBULATORY_CARE_PROVIDER_SITE_OTHER): Payer: BC Managed Care – PPO | Admitting: Physician Assistant

## 2016-01-27 VITALS — BP 110/78 | HR 96 | Temp 101.5°F | Resp 18 | Wt 174.0 lb

## 2016-01-27 DIAGNOSIS — R509 Fever, unspecified: Secondary | ICD-10-CM

## 2016-01-27 DIAGNOSIS — J101 Influenza due to other identified influenza virus with other respiratory manifestations: Secondary | ICD-10-CM | POA: Diagnosis not present

## 2016-01-27 DIAGNOSIS — Z131 Encounter for screening for diabetes mellitus: Secondary | ICD-10-CM | POA: Diagnosis not present

## 2016-01-27 LAB — HEMOGLOBIN A1C, FINGERSTICK: Hgb A1C (fingerstick): 5.3 % (ref <5.7)

## 2016-01-27 NOTE — Progress Notes (Signed)
Patient ID: DULCE CHRESTMAN MRN: [id number], DOB: July 05, 1971, 45 y.o. Date of Encounter: 01/27/2016, 11:41 AM    Chief Complaint:  Chief Complaint  Patient presents with  . sick x 4 days    flu vs bronchitis  . has question about blood sugar     HPI: 45 y.o. year old female since with above.  States that on Monday 01/24/16 she developed cough. "Started taking Mucinex, thinking it was just her usual bronchitis."  Says this morning she got up to go to work but "started feeling really bad."  Therefore came here for evaluation.  She also states that she has multiple family members who have diabetes and would like to check A1c. Says that her father and her sister and multiple family members in her father's family have diabetes.     Home Meds:   Outpatient Prescriptions Prior to Visit  Medication Sig Dispense Refill  . Albuterol Sulfate (PROAIR RESPICLICK) 123XX123 (90 BASE) MCG/ACT AEPB Inhale 2 puffs into the lungs every 4 (four) hours. 1 each 0  . ALPRAZolam (XANAX) 0.5 MG tablet TAKE 1 TABLET BY MOUTH AT BEDTIME AS NEEDED 30 tablet 2  . aspirin-acetaminophen-caffeine (EXCEDRIN MIGRAINE) [id number] MG per tablet Take 2 tablets by mouth every 6 (six) hours as needed for pain.    Marland Kitchen CAMILA 0.35 MG tablet Take 1 tablet by mouth daily.  3  . cyclobenzaprine (FLEXERIL) 10 MG tablet Take 1 tablet (10 mg total) by mouth 3 (three) times daily as needed for muscle spasms. 30 tablet 0  . DULoxetine (CYMBALTA) 60 MG capsule Take 1 capsule (60 mg total) by mouth daily. 30 capsule 5  . ibuprofen (ADVIL,MOTRIN) 200 MG tablet Take 600 mg by mouth every 6 (six) hours as needed for pain.    . Linaclotide (LINZESS) 290 MCG CAPS capsule Take 1 capsule (290 mcg total) by mouth every other day. 30 capsule 11  . nitroGLYCERIN (NITROSTAT) 0.4 MG SL tablet Place 1 tablet (0.4 mg total) under the tongue every 5 (five) minutes as needed for chest pain. See PCP for refills 25 tablet 0  . oxyCODONE-acetaminophen  (PERCOCET) 10-325 MG tablet Take 1 tablet by mouth every 8 (eight) hours as needed for pain (for migraines). 30 tablet 0  . promethazine (PHENERGAN) 25 MG tablet TAKE 1 TABELT BY MOUTH EVERY 4 HOURS 30 tablet 0  . Vitamin D, Ergocalciferol, (DRISDOL) 50000 UNITS CAPS capsule Take 50,000 Units by mouth every 7 (seven) days. Reported on 12/02/2015    . pantoprazole (PROTONIX) 40 MG tablet Take 1 tablet (40 mg total) by mouth daily. (Patient not taking: Reported on 12/02/2015) 30 tablet 3  . benzonatate (TESSALON) 200 MG capsule Take 1 capsule (200 mg total) by mouth 2 (two) times daily as needed for cough. 20 capsule 0  . chlorpheniramine-HYDROcodone (TUSSIONEX PENNKINETIC ER) 10-8 MG/5ML SUER Take 5 mLs by mouth every 12 (twelve) hours as needed for cough. 180 mL 0  . chlorpheniramine-HYDROcodone (TUSSIONEX PENNKINETIC ER) 10-8 MG/5ML SUER Take 5 mLs by mouth every 12 (twelve) hours as needed for cough. 140 mL 0  . levofloxacin (LEVAQUIN) 750 MG tablet Take 1 tablet (750 mg total) by mouth daily. 7 tablet 0  . predniSONE (DELTASONE) 20 MG tablet Take 2 tablets (40 mg total) by mouth daily with breakfast. For 5 days 10 tablet 0   No facility-administered medications prior to visit.    Allergies:  Allergies  Allergen Reactions  . Ceftin [Cefuroxime Axetil] Rash  . Sulfa  Antibiotics Rash      Review of Systems: See HPI for pertinent ROS. All other ROS negative.    Physical Exam: Blood pressure 110/78, pulse 96, temperature 101.5 F (38.6 C), temperature source Oral, resp. rate 18, weight 174 lb (78.926 kg)., Body mass index is 33.98 kg/(m^2). General:  WNWD WF. Appears in no acute distress. HEENT: Normocephalic, atraumatic, eyes without discharge, sclera non-icteric, nares are without discharge. Bilateral auditory canals clear, TM's are without perforation, pearly grey and translucent with reflective cone of light bilaterally. Oral cavity moist, posterior pharynx without exudate, erythema,  peritonsillar abscess.  Neck: Supple. No thyromegaly. No lymphadenopathy. Lungs: Clear bilaterally to auscultation without wheezes, rales, or rhonchi. Breathing is unlabored. Heart: Regular rhythm. No murmurs, rubs, or gallops. Msk:  Strength and tone normal for age. Extremities/Skin: Warm and dry. Neuro: Alert and oriented X 3. Moves all extremities spontaneously. Gait is normal. CNII-XII grossly in tact. Psych:  Responds to questions appropriately with a normal affect.     ASSESSMENT AND PLAN:  45 y.o. year old female with  1. Influenza B Flu test positive for influenza B. Tamiflu would not be effective given duration of symptoms/illness. He is to use Tylenol or Motrin to control fever and continues over-the-counter cough medications as needed for symptom relief. She works in the school system. She states that she was already out of work yesterday and today. Note given for her out of work yesterday today and tomorrow which is Friday. She has been off the weekend. Plan for return Monday. Follow up if symptoms worsen significantly or persist greater than 7 days.  2. Fever and chills - Influenza a and b  3. Diabetes mellitus screening - Hemoglobin A1C, fingerstick   Signed, [address] [place], [place], [facility] 01/27/2016 11:41 AM

## 2016-01-31 ENCOUNTER — Telehealth: Payer: Self-pay | Admitting: Family Medicine

## 2016-01-31 NOTE — Telephone Encounter (Signed)
Recovering from Flu, see OV last week.  Is feeling better but having a lot of dizziness.  Is there something she can take for that??

## 2016-01-31 NOTE — Telephone Encounter (Signed)
Meclizine 25 mg poq8 hrs prn dizziness.

## 2016-02-01 MED ORDER — MECLIZINE HCL 25 MG PO TABS: 25.0000 mg | ORAL_TABLET | Freq: Three times a day (TID) | ORAL | Status: DC | PRN

## 2016-02-01 NOTE — Telephone Encounter (Signed)
Rx to pharmacy and pt aware 

## 2016-02-02 ENCOUNTER — Telehealth: Payer: Self-pay | Admitting: Family Medicine

## 2016-02-02 NOTE — Telephone Encounter (Signed)
Pt left a voicemail requesting a work not to be out 01/27/2016 through 02/04/2016 is this OK to write? Pt saw Olean Ree on 01/27/2016 [phone]

## 2016-02-03 ENCOUNTER — Encounter: Payer: Self-pay | Admitting: Family Medicine

## 2016-02-03 NOTE — Telephone Encounter (Signed)
Approved.  

## 2016-02-03 NOTE — Telephone Encounter (Signed)
New work excuse written, pt made aware, will come to office to pick up later today

## 2016-02-03 NOTE — Telephone Encounter (Signed)
Was given note thru 01/31/16  Please advise?

## 2016-02-11 ENCOUNTER — Telehealth: Payer: Self-pay | Admitting: Family Medicine

## 2016-02-11 NOTE — Telephone Encounter (Signed)
Patient was in last week, diagnosed with flu now is still feeling really bad and would like to know if maybe something else can be called in for her please call her at [phone]

## 2016-02-21 ENCOUNTER — Other Ambulatory Visit: Payer: Self-pay | Admitting: Family Medicine

## 2016-02-22 NOTE — Telephone Encounter (Signed)
Medication refilled per protocol. 

## 2016-02-24 ENCOUNTER — Encounter: Payer: Self-pay | Admitting: Family Medicine

## 2016-02-24 ENCOUNTER — Ambulatory Visit (INDEPENDENT_AMBULATORY_CARE_PROVIDER_SITE_OTHER): Payer: BC Managed Care – PPO | Admitting: Family Medicine

## 2016-02-24 VITALS — BP 112/76 | HR 92 | Temp 98.2°F | Resp 18 | Ht 60.0 in | Wt 176.0 lb

## 2016-02-24 DIAGNOSIS — H8120 Vestibular neuronitis, unspecified ear: Secondary | ICD-10-CM

## 2016-02-24 DIAGNOSIS — R5383 Other fatigue: Secondary | ICD-10-CM

## 2016-02-24 MED ORDER — PREDNISONE 20 MG PO TABS: ORAL_TABLET | ORAL | Status: DC

## 2016-02-24 NOTE — Progress Notes (Signed)
Subjective:    Andrea [name] ID: Andrea Andrea [name], female    DOB: 05-03-1971, 45 y.o.   MRN: [id number]  HPI  Andrea [name] was seen February 16 with fevers and chills and was diagnosed with influenza. The viral illness persisted approximately 1 week and then finally resolved. However ever since that time she reports dizziness that is constant. She states that she feels like she is floating. She also reports vertigo whenever she lies down or stands up quickly. She denies any hearing loss. She denies any tinnitus. She denies any new headaches beyond her typical migraines. She denies any vision changes. She denies any head injury. She denies any fevers or chills or neck stiffness. She also reports worsening of her fatigue. She has been seen numerous times over the last few years for chronic fatigue and in addition to B12 deficiency has also been diagnosed with fibromyalgia. However the fatigue is drastically worsened since her recent flulike illness  Past Medical History  Diagnosis Date  . B12 deficiency   . IBS (irritable bowel syndrome)   . Shortness of breath     "all the time when I came in today" (09/02/2013)  . GERD (gastroesophageal reflux disease)   . Migraine headache     "maybe 6-8 times/yr" (09/02/2013)  . Epicondylitis, lateral     Right  . Arthritis     "hands" (09/02/2013)  . Angiolipoma of kidney    Current Outpatient Prescriptions on File Prior to Visit  Medication Sig Dispense Refill  . Albuterol Sulfate (PROAIR RESPICLICK) 123XX123 (90 BASE) MCG/ACT AEPB Inhale 2 puffs into the lungs every 4 (four) hours. 1 each 0  . ALPRAZolam (XANAX) 0.5 MG tablet TAKE 1 TABLET BY MOUTH AT BEDTIME AS NEEDED 30 tablet 2  . aspirin-acetaminophen-caffeine (EXCEDRIN MIGRAINE) [id number] MG per tablet Take 2 tablets by mouth every 6 (six) hours as needed for pain.    Marland Kitchen CAMILA 0.35 MG tablet Take 1 tablet by mouth daily.  3  . Cholecalciferol (VITAMIN D PO) Take by mouth.    . cyclobenzaprine (FLEXERIL) 10 MG  tablet Take 1 tablet (10 mg total) by mouth 3 (three) times daily as needed for muscle spasms. 30 tablet 0  . DULoxetine (CYMBALTA) 60 MG capsule TAKE 1 CAPSULE EVERY DAY 30 capsule 5  . ibuprofen (ADVIL,MOTRIN) 200 MG tablet Take 600 mg by mouth every 6 (six) hours as needed for pain.    . Linaclotide (LINZESS) 290 MCG CAPS capsule Take 1 capsule (290 mcg total) by mouth every other day. 30 capsule 11  . meclizine (ANTIVERT) 25 MG tablet Take 1 tablet (25 mg total) by mouth every 8 (eight) hours as needed for dizziness. 30 tablet 0  . nitroGLYCERIN (NITROSTAT) 0.4 MG SL tablet Place 1 tablet (0.4 mg total) under the tongue every 5 (five) minutes as needed for chest pain. See PCP for refills 25 tablet 0  . oxyCODONE-acetaminophen (PERCOCET) 10-325 MG tablet Take 1 tablet by mouth every 8 (eight) hours as needed for pain (for migraines). 30 tablet 0  . promethazine (PHENERGAN) 25 MG tablet TAKE 1 TABELT BY MOUTH EVERY 4 HOURS 20 tablet 0  . pantoprazole (PROTONIX) 40 MG tablet Take 1 tablet (40 mg total) by mouth daily. (Andrea [name] not taking: Reported on 12/02/2015) 30 tablet 3   No current facility-administered medications on file prior to visit.   Allergies  Allergen Reactions  . Ceftin [Cefuroxime Axetil] Rash  . Sulfa Antibiotics Rash   Social History   Social History  .  Marital Status: Married    Spouse Name: N/A  . Number of Children: N/A  . Years of Education: N/A   Occupational History  . Not on file.   Social History Main Topics  . Smoking status: Never Smoker   . Smokeless tobacco: Never Used  . Alcohol Use: No  . Drug Use: No  . Sexual Activity: Not Currently   Other Topics Concern  . Not on file   Social History Narrative        Review of Systems  All other systems reviewed and are negative.      Objective:   Physical Exam  Constitutional: She is oriented to person, place, and time. She appears well-developed and well-nourished. No distress.  HENT:   Head: Normocephalic.  Right Ear: External ear normal.  Left Ear: External ear normal.  Nose: Nose normal.  Mouth/Throat: Oropharynx is clear and moist. No oropharyngeal exudate.  Eyes: Conjunctivae and EOM are normal. Pupils are equal, round, and reactive to light. Right eye exhibits no discharge. Left eye exhibits no discharge. No scleral icterus.  Neck: Neck supple. No JVD present. No thyromegaly present.  Cardiovascular: Normal rate, regular rhythm, normal heart sounds and intact distal pulses.   No murmur heard. Pulmonary/Chest: Effort normal and breath sounds normal. No respiratory distress. She has no wheezes. She has no rales. She exhibits no tenderness.  Abdominal: Soft. Bowel sounds are normal. She exhibits no distension. There is no tenderness. There is no rebound.  Lymphadenopathy:    She has no cervical adenopathy.  Neurological: She is alert and oriented to person, place, and time. No cranial nerve deficit. She exhibits normal muscle tone. Coordination normal.  Skin: Skin is warm. No rash noted. She is not diaphoretic. No erythema.  Psychiatric: She has a normal mood and affect. Her behavior is normal. Judgment and thought content normal.  Vitals reviewed.         Assessment & Plan:  Vestibular neuritis, unspecified laterality - Plan: predniSONE (DELTASONE) 20 MG tablet  Other fatigue - Plan: CBC with Differential/Platelet, COMPLETE METABOLIC PANEL WITH GFR, TSH, Vitamin B12  Andrea [name]'s neurologic exam is completely normal. She has normal finger to nose testing, a normal Romberg's sign, normal cerebellar testing. She has a negative Dix-Hallpike maneuver. My best guess is that the symptoms either represent disequilibrium which is a nonspecific finding or possibly mild vestibular neuritis after her recent viral illness. The only evidence I can find for treatment of vestibular neuritis his corticosteroids. The Andrea [name] would like to try prednisone pack. I will give her a  prednisone taper pack and recheck next week. I see no evidence of more significant pathology on her exam. I believe the fatigue is likely her chronic problems is slightly worse due to her viral illness however I will obtain baseline lab work to rule out more significant abnormalities

## 2016-02-25 LAB — COMPLETE METABOLIC PANEL WITH GFR
ALT: 13 U/L (ref 6–29)
AST: 16 U/L (ref 10–30)
Albumin: 4.2 g/dL (ref 3.6–5.1)
Alkaline Phosphatase: 82 U/L (ref 33–115)
BILIRUBIN TOTAL: 0.2 mg/dL (ref 0.2–1.2)
BUN: 12 mg/dL (ref 7–25)
CALCIUM: 9.1 mg/dL (ref 8.6–10.2)
CHLORIDE: 107 mmol/L (ref 98–110)
CO2: 27 mmol/L (ref 20–31)
CREATININE: 0.68 mg/dL (ref 0.50–1.10)
Glucose, Bld: 118 mg/dL — ABNORMAL HIGH (ref 70–99)
Potassium: 3.9 mmol/L (ref 3.5–5.3)
Sodium: 139 mmol/L (ref 135–146)
TOTAL PROTEIN: 6.5 g/dL (ref 6.1–8.1)

## 2016-02-25 LAB — CBC WITH DIFFERENTIAL/PLATELET
Basophils Absolute: 0.1 10*3/uL (ref 0.0–0.1)
Basophils Relative: 1 % (ref 0–1)
EOS ABS: 0.2 10*3/uL (ref 0.0–0.7)
EOS PCT: 2 % (ref 0–5)
HEMATOCRIT: 40.8 % (ref 36.0–46.0)
Hemoglobin: 13.2 g/dL (ref 12.0–15.0)
LYMPHS ABS: 2.6 10*3/uL (ref 0.7–4.0)
LYMPHS PCT: 24 % (ref 12–46)
MCH: 27.1 pg (ref 26.0–34.0)
MCHC: 32.4 g/dL (ref 30.0–36.0)
MCV: 83.8 fL (ref 78.0–100.0)
MONO ABS: 0.4 10*3/uL (ref 0.1–1.0)
MONOS PCT: 4 % (ref 3–12)
MPV: 10.8 fL (ref 8.6–12.4)
Neutro Abs: 7.5 10*3/uL (ref 1.7–7.7)
Neutrophils Relative %: 69 % (ref 43–77)
PLATELETS: 244 10*3/uL (ref 150–400)
RBC: 4.87 MIL/uL (ref 3.87–5.11)
RDW: 13.5 % (ref 11.5–15.5)
WBC: 10.8 10*3/uL — ABNORMAL HIGH (ref 4.0–10.5)

## 2016-02-25 LAB — TSH: TSH: 3.03 m[IU]/L

## 2016-02-25 LAB — VITAMIN B12: VITAMIN B 12: 350 pg/mL (ref 200–1100)

## 2016-03-06 ENCOUNTER — Telehealth: Payer: Self-pay | Admitting: Family Medicine

## 2016-03-06 NOTE — Telephone Encounter (Signed)
ok 

## 2016-03-06 NOTE — Telephone Encounter (Signed)
?   OK to Refill  

## 2016-03-06 NOTE — Telephone Encounter (Signed)
Patient needs rx for her oxycodone  Has 2 pills left as of today  [phone] when ready

## 2016-03-07 MED ORDER — OXYCODONE-ACETAMINOPHEN 10-325 MG PO TABS: 1.0000 | ORAL_TABLET | Freq: Three times a day (TID) | ORAL | Status: DC | PRN

## 2016-03-07 NOTE — Telephone Encounter (Signed)
RX printed, left up front and patient aware to pick up via vm 

## 2016-03-13 ENCOUNTER — Other Ambulatory Visit: Payer: Self-pay | Admitting: Family Medicine

## 2016-03-14 NOTE — Telephone Encounter (Signed)
ok 

## 2016-03-14 NOTE — Telephone Encounter (Signed)
Medication called to pharmacy. 

## 2016-03-14 NOTE — Telephone Encounter (Signed)
Ok to refill??  Last office visit 02/24/2016.  Last refill 10/25/2015, #2 refills.

## 2016-04-04 ENCOUNTER — Other Ambulatory Visit: Payer: Self-pay | Admitting: Family Medicine

## 2016-04-04 ENCOUNTER — Ambulatory Visit (INDEPENDENT_AMBULATORY_CARE_PROVIDER_SITE_OTHER): Payer: BC Managed Care – PPO | Admitting: Family Medicine

## 2016-04-04 ENCOUNTER — Ambulatory Visit
Admission: RE | Admit: 2016-04-04 | Discharge: 2016-04-04 | Disposition: A | Discharge status: Home / Self Care | Payer: BC Managed Care – PPO | Source: Ambulatory Visit | Attending: Family Medicine | Admitting: Family Medicine

## 2016-04-04 ENCOUNTER — Encounter: Payer: Self-pay | Admitting: Family Medicine

## 2016-04-04 VITALS — BP 120/72 | HR 94 | Temp 98.4°F | Resp 18 | Ht 60.0 in | Wt 177.0 lb

## 2016-04-04 DIAGNOSIS — M79642 Pain in left hand: Secondary | ICD-10-CM

## 2016-04-04 DIAGNOSIS — M79641 Pain in right hand: Secondary | ICD-10-CM | POA: Diagnosis not present

## 2016-04-04 LAB — CBC WITH DIFFERENTIAL/PLATELET
Basophils Absolute: 77 cells/uL (ref 0–200)
Basophils Relative: 1 %
EOS PCT: 1 %
Eosinophils Absolute: 77 cells/uL (ref 15–500)
HCT: 42.5 % (ref 35.0–45.0)
HEMOGLOBIN: 13.8 g/dL (ref 12.0–15.0)
LYMPHS ABS: 1848 {cells}/uL (ref 850–3900)
LYMPHS PCT: 24 %
MCH: 27.4 pg (ref 27.0–33.0)
MCHC: 32.5 g/dL (ref 32.0–36.0)
MCV: 84.5 fL (ref 80.0–100.0)
MONOS PCT: 5 %
MPV: 10.6 fL (ref 7.5–12.5)
Monocytes Absolute: 385 cells/uL (ref 200–950)
NEUTROS PCT: 69 %
Neutro Abs: 5313 cells/uL (ref 1500–7800)
PLATELETS: 249 10*3/uL (ref 140–400)
RBC: 5.03 MIL/uL (ref 3.80–5.10)
RDW: 13.5 % (ref 11.0–15.0)
WBC: 7.7 10*3/uL (ref 3.8–10.8)

## 2016-04-04 LAB — RHEUMATOID FACTOR: Rhuematoid fact SerPl-aCnc: 17 IU/mL — ABNORMAL HIGH (ref <=14)

## 2016-04-04 NOTE — Progress Notes (Signed)
Subjective:    [name] ID: Andrea [name], female    DOB: 27-Oct-1971, 45 y.o.   MRN: [id number]  HPI [name] reports severe pain in both hands distal to the wrist. She reports pain in stiffness in the PIP joints and DIP joints every morning. She reports pain in both shoulders. There are no exacerbating or alleviating factors. She denies any recent injuries. She does have a past medical history of fibromyalgia. On examination today there is no erythema in the joints on her hands. There is no ulnar deviation. There are no palpable rheumatoid nodules. There is no warmth in the joints or obvious signs of tenosynovitis. She has full range of motion of the shoulders and elbows. Exam is unremarkable. Past Medical History  Diagnosis Date  . B12 deficiency   . IBS (irritable bowel syndrome)   . Shortness of breath     "all the time when I came in today" (09/02/2013)  . GERD (gastroesophageal reflux disease)   . Migraine headache     "maybe 6-8 times/yr" (09/02/2013)  . Epicondylitis, lateral     Right  . Arthritis     "hands" (09/02/2013)  . Angiolipoma of kidney    Past Surgical History  Procedure Laterality Date  . Reduction mammaplasty Bilateral ~ 2009  . Palatal expansion  2004  . Appendectomy  1984   Current Outpatient Prescriptions on File Prior to Visit  Medication Sig Dispense Refill  . Albuterol Sulfate (PROAIR RESPICLICK) 123XX123 (90 BASE) MCG/ACT AEPB Inhale 2 puffs into the lungs every 4 (four) hours. 1 each 0  . ALPRAZolam (XANAX) 0.5 MG tablet TAKE 1 TABLET AT BEDTIME AS NEEDED 30 tablet 2  . aspirin-acetaminophen-caffeine (EXCEDRIN MIGRAINE) [id number] MG per tablet Take 2 tablets by mouth every 6 (six) hours as needed for pain.    Marland Kitchen CAMILA 0.35 MG tablet Take 1 tablet by mouth daily.  3  . Cholecalciferol (VITAMIN D PO) Take by mouth.    . DULoxetine (CYMBALTA) 60 MG capsule TAKE 1 CAPSULE EVERY DAY 30 capsule 5  . ibuprofen (ADVIL,MOTRIN) 200 MG tablet Take 600 mg by  mouth every 6 (six) hours as needed for pain.    . Linaclotide (LINZESS) 290 MCG CAPS capsule Take 1 capsule (290 mcg total) by mouth every other day. 30 capsule 11  . nitroGLYCERIN (NITROSTAT) 0.4 MG SL tablet Place 1 tablet (0.4 mg total) under the tongue every 5 (five) minutes as needed for chest pain. See PCP for refills 25 tablet 0  . oxyCODONE-acetaminophen (PERCOCET) 10-325 MG tablet Take 1 tablet by mouth every 8 (eight) hours as needed for pain (for migraines). 30 tablet 0  . promethazine (PHENERGAN) 25 MG tablet TAKE 1 TABELT BY MOUTH EVERY 4 HOURS 20 tablet 0   No current facility-administered medications on file prior to visit.   Allergies  Allergen Reactions  . Ceftin [Cefuroxime Axetil] Rash  . Sulfa Antibiotics Rash   Social History   Social History  . Marital Status: Married    Spouse Name: N/A  . Number of Children: N/A  . Years of Education: N/A   Occupational History  . Not on file.   Social History Main Topics  . Smoking status: Never Smoker   . Smokeless tobacco: Never Used  . Alcohol Use: No  . Drug Use: No  . Sexual Activity: Not Currently   Other Topics Concern  . Not on file   Social History Narrative       Review of  Systems  All other systems reviewed and are negative.      Objective:   Physical Exam  Cardiovascular: Normal rate, regular rhythm and normal heart sounds.   Pulmonary/Chest: Effort normal and breath sounds normal. No respiratory distress. She has no wheezes. She has no rales.  Musculoskeletal:       Right hand: She exhibits bony tenderness. She exhibits normal range of motion, no tenderness, no deformity and no swelling. Normal sensation noted. Normal strength noted.       Left hand: She exhibits bony tenderness. She exhibits normal range of motion, no tenderness, no deformity and no swelling. Normal sensation noted. Normal strength noted.  Vitals reviewed.         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 Rule out rheumatoid arthritis with a sedimentation rate and rheumatoid factor. Check a CBC to evaluate for any signs of leukocytosis or other bone marrow abnormalities. Obtain x-rays of both hands area if x-rays confirm osteoarthritis, I would recommend NSAIDs. She does have a positive Tinel sign today and also a positive Phalen sign so atypical carpal tunnel can also be a possibility. Consider nerve conduction test if workup is unremarkable.

## 2016-04-05 LAB — SEDIMENTATION RATE: SED RATE: 4 mm/h (ref 0–20)

## 2016-04-07 ENCOUNTER — Other Ambulatory Visit: Payer: Self-pay | Admitting: Family Medicine

## 2016-04-07 DIAGNOSIS — R768 Other specified abnormal immunological findings in serum: Secondary | ICD-10-CM

## 2016-04-10 ENCOUNTER — Other Ambulatory Visit: Payer: BC Managed Care – PPO

## 2016-04-10 LAB — CYCLIC CITRUL PEPTIDE ANTIBODY, IGG: Cyclic Citrullin Peptide Ab: <16 Units

## 2016-05-02 LAB — INFLUENZA A AND B AG, IMMUNOASSAY
INFLUENZA A ANTIGEN: NOT DETECTED
INFLUENZA B ANTIGEN: DETECTED — AB

## 2016-06-02 ENCOUNTER — Other Ambulatory Visit: Payer: Self-pay | Admitting: Physician Assistant

## 2016-06-02 NOTE — Telephone Encounter (Signed)
Medication refilled per protocol. 

## 2016-06-23 ENCOUNTER — Telehealth: Payer: Self-pay | Admitting: Family Medicine

## 2016-06-23 NOTE — Telephone Encounter (Signed)
ok 

## 2016-06-23 NOTE — Telephone Encounter (Signed)
Ok to refill 

## 2016-06-23 NOTE — Telephone Encounter (Signed)
Pt is requesting a refill of Oxycodone 10-325.

## 2016-06-26 MED ORDER — OXYCODONE-ACETAMINOPHEN 10-325 MG PO TABS: 1.0000 | ORAL_TABLET | Freq: Three times a day (TID) | ORAL | Status: DC | PRN

## 2016-06-26 NOTE — Telephone Encounter (Signed)
RX printed, left up front and patient aware to pick up  

## 2016-07-02 ENCOUNTER — Other Ambulatory Visit: Payer: Self-pay | Admitting: Family Medicine

## 2016-07-03 NOTE — Telephone Encounter (Signed)
ok 

## 2016-07-03 NOTE — Telephone Encounter (Signed)
Ok to refill 

## 2016-07-06 ENCOUNTER — Other Ambulatory Visit: Payer: Self-pay | Admitting: Family Medicine

## 2016-07-06 NOTE — Telephone Encounter (Signed)
Refill appropriate and filled per protocol. 

## 2016-08-24 ENCOUNTER — Ambulatory Visit (INDEPENDENT_AMBULATORY_CARE_PROVIDER_SITE_OTHER): Payer: BC Managed Care – PPO | Admitting: Physician Assistant

## 2016-08-24 ENCOUNTER — Other Ambulatory Visit: Payer: Self-pay | Admitting: Physician Assistant

## 2016-08-24 ENCOUNTER — Encounter: Payer: Self-pay | Admitting: Physician Assistant

## 2016-08-24 VITALS — BP 110/78 | HR 83 | Temp 98.1°F | Resp 16 | Wt 176.0 lb

## 2016-08-24 DIAGNOSIS — H9203 Otalgia, bilateral: Secondary | ICD-10-CM

## 2016-08-24 NOTE — Progress Notes (Signed)
Patient ID: Andrea [name] MRN: [id number], DOB: 01/19/1971, 45 y.o. Date of Encounter: 08/24/2016, 3:49 PM    Chief Complaint:  Chief Complaint  Patient presents with  . Ear Pain    left and right ear pain, started 9-13     HPI: 45 y.o. year old white female presents with above.   Says that she doesn't usually have problems with her ears. However says that yesterday and especially last night when she layed down to sleep, she felt some discomfort in her ears. This morning her throat was a little scratchy. Has had no nasal congestion and no mucus from the nose. No chest congestion or cough. No fevers or chills.  Says that her mom passed away recently and she has been crying a lot and doesn't know if this is contributing.     Home Meds:   Outpatient Medications Prior to Visit  Medication Sig Dispense Refill  . Albuterol Sulfate (PROAIR RESPICLICK) 123XX123 (90 BASE) MCG/ACT AEPB Inhale 2 puffs into the lungs every 4 (four) hours. 1 each 0  . ALPRAZolam (XANAX) 0.5 MG tablet TAKE 1 TABLET AT BEDTIME AS NEEDED FOR ANXIETY / SLEEP 30 tablet 2  . aspirin-acetaminophen-caffeine (EXCEDRIN MIGRAINE) [id number] MG per tablet Take 2 tablets by mouth every 6 (six) hours as needed for pain.    Marland Kitchen CAMILA 0.35 MG tablet Take 1 tablet by mouth daily.  3  . Cholecalciferol (VITAMIN D PO) Take by mouth.    . DULoxetine (CYMBALTA) 60 MG capsule TAKE 1 CAPSULE EVERY DAY 30 capsule 5  . ibuprofen (ADVIL,MOTRIN) 200 MG tablet Take 600 mg by mouth every 6 (six) hours as needed for pain.    Marland Kitchen LINZESS 290 MCG CAPS capsule TAKE 1 CAPSULE BY MOUTH EVERY OTHER DAY 30 capsule 0  . oxyCODONE-acetaminophen (PERCOCET) 10-325 MG tablet Take 1 tablet by mouth every 8 (eight) hours as needed for pain (for migraines). 30 tablet 0  . promethazine (PHENERGAN) 25 MG tablet TAKE 1 TABELT BY MOUTH EVERY 4 HOURS 20 tablet 0  . nitroGLYCERIN (NITROSTAT) 0.4 MG SL tablet Place 1 tablet (0.4 mg total) under the tongue  every 5 (five) minutes as needed for chest pain. See PCP for refills (Patient not taking: Reported on 08/24/2016) 25 tablet 0   No facility-administered medications prior to visit.     Allergies:  Allergies  Allergen Reactions  . Prednisone Shortness Of Breath  . Ceftin [Cefuroxime Axetil] Rash  . Sulfa Antibiotics Rash      Review of Systems: See HPI for pertinent ROS. All other ROS negative.    Physical Exam: Blood pressure 110/78, pulse 83, temperature 98.1 F (36.7 C), temperature source Oral, resp. rate 16, weight 176 lb (79.8 kg)., Body mass index is 34.37 kg/m. General:  WF. Appears in no acute distress. HEENT: Normocephalic, atraumatic, eyes without discharge, sclera non-icteric, nares are without discharge. Bilateral auditory canals clear, TM's are without perforation, pearly grey and translucent with reflective cone of light bilaterally. Oral cavity moist, posterior pharynx without exudate, erythema, peritonsillar abscess. I palpated the TM joints and also palpated them while she opened and closed her mouth but she reports no tenderness with palpation of the TM joints bilaterally.  Neck: Supple. No thyromegaly. No lymphadenopathy. Lungs: Clear bilaterally to auscultation without wheezes, rales, or rhonchi. Breathing is unlabored. Heart: Regular rhythm. No murmurs, rubs, or gallops. Msk:  Strength and tone normal for age. Extremities/Skin: Warm and dry.  Neuro: Alert and oriented X 3. Moves  all extremities spontaneously. Gait is normal. CNII-XII grossly in tact. Psych:  Responds to questions appropriately with a normal affect.     ASSESSMENT AND PLAN:  45 y.o. year old female with   1. Otalgia of both ears Ear exam is normal. No sign of otitis externa or otitis media. No sign of TMJ. Given that her throat was scratchy this morning, suspect that she may be developing some congestion behind her ears. Gave her some samples of Norel AD. Can use this for decongestant. Also  recommend taking Zyrttec at night. Told her to call if she develops additional signs or symptoms or if ear pain worsens are not resolved in a week.   [address] [place], [place], [facility] 08/24/2016 3:49 PM

## 2016-08-31 ENCOUNTER — Other Ambulatory Visit: Payer: Self-pay | Admitting: Physician Assistant

## 2016-09-01 NOTE — Telephone Encounter (Signed)
RX filled per protocol 

## 2016-09-27 ENCOUNTER — Telehealth: Payer: Self-pay | Admitting: Family Medicine

## 2016-09-27 NOTE — Telephone Encounter (Signed)
Requesting a refill on Oxycodone - Ok to refill??

## 2016-09-28 MED ORDER — OXYCODONE-ACETAMINOPHEN 10-325 MG PO TABS: 1.0000 | ORAL_TABLET | Freq: Three times a day (TID) | ORAL | 0 refills | Status: DC | PRN

## 2016-09-28 NOTE — Telephone Encounter (Signed)
RX printed, left up front, tried to call pt on home number no answer and no vm will send mychart message.

## 2016-09-28 NOTE — Telephone Encounter (Signed)
ok 

## 2016-10-11 ENCOUNTER — Other Ambulatory Visit: Payer: Self-pay | Admitting: Family Medicine

## 2016-10-12 NOTE — Telephone Encounter (Signed)
Medication called to pharmacy. 

## 2016-10-12 NOTE — Telephone Encounter (Signed)
ok 

## 2016-10-12 NOTE — Telephone Encounter (Signed)
Ok to refill 

## 2016-10-20 ENCOUNTER — Ambulatory Visit (INDEPENDENT_AMBULATORY_CARE_PROVIDER_SITE_OTHER): Payer: BC Managed Care – PPO | Admitting: Family Medicine

## 2016-10-20 ENCOUNTER — Encounter: Payer: Self-pay | Admitting: Family Medicine

## 2016-10-20 VITALS — BP 110/78 | HR 100 | Temp 98.7°F | Resp 16 | Ht 60.0 in | Wt 184.0 lb

## 2016-10-20 DIAGNOSIS — M797 Fibromyalgia: Secondary | ICD-10-CM

## 2016-10-20 DIAGNOSIS — R002 Palpitations: Secondary | ICD-10-CM

## 2016-10-20 MED ORDER — PREGABALIN 50 MG PO CAPS: 50.0000 mg | ORAL_CAPSULE | Freq: Three times a day (TID) | ORAL | 1 refills | Status: DC

## 2016-10-20 NOTE — Progress Notes (Signed)
Subjective:    Andrea [name] ID: Andrea Andrea [name], female    DOB: 30-Jul-1971, 45 y.o.   MRN: [id number]  HPI  4/17 The Andrea [name] reports severe pain in both hands distal to the wrist. She reports pain in stiffness in the PIP joints and DIP joints every morning. She reports pain in both shoulders. There are no exacerbating or alleviating factors. She denies any recent injuries. She does have a past medical history of fibromyalgia. On examination today there is no erythema in the joints on her hands. There is no ulnar deviation. There are no palpable rheumatoid nodules. There is no warmth in the joints or obvious signs of tenosynovitis. She has full range of motion of the shoulders and elbows. Exam is unremarkable.  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 Rule out rheumatoid arthritis with a sedimentation rate and rheumatoid factor. Check a CBC to evaluate for any signs of leukocytosis or other bone marrow abnormalities. Obtain x-rays of both hands area if x-rays confirm osteoarthritis, I would recommend NSAIDs. She does have a positive Tinel sign today and also a positive Phalen sign so atypical carpal tunnel can also be a possibility. Consider nerve conduction test if workup is unremarkable.  10/20/16 The Andrea [name]'s lab work was unremarkable and x-rays were unremarkable. A rheumatology consultation did not find evidence of any rheumatologic disorder. My clinical impression is that the Andrea [name] has fibromyalgia syndrome complicated with migraines. Over the last few weeks, she has developed diffuse pain all over her body. The pain is present in both shoulders and in both elbows. She is having daily headaches. She also has pain in her hands and in her knees. She is interested in better options for pain control. She is having use Percocet for migraines and she is using it more frequently due to the frequency of migraines.  She also reports an incident where she felt her heart began to race. She became very lightheaded and felt like she was going to pass out. She broke out in a sweat. Symptoms resolved after a few minutes. Symptoms sound like a panic attack    Past Medical History:  Diagnosis Date  . Angiolipoma of kidney   . Arthritis    "hands" (09/02/2013)  . B12 deficiency   . Epicondylitis, lateral    Right  . GERD (gastroesophageal reflux disease)   . IBS (irritable bowel syndrome)   . Migraine headache    "maybe 6-8 times/yr" (09/02/2013)  . Shortness of breath    "all the time when I came in today" (09/02/2013)   Past Surgical History:  Procedure Laterality Date  . APPENDECTOMY  1984  . PALATAL EXPANSION  2004  . REDUCTION MAMMAPLASTY Bilateral ~ 2009   Current Outpatient Prescriptions on File Prior to Visit  Medication Sig Dispense Refill  . Albuterol Sulfate (PROAIR RESPICLICK) 123XX123 (90 BASE) MCG/ACT AEPB Inhale 2 puffs into the lungs every 4 (four) hours. 1 each 0  . ALPRAZolam (XANAX) 0.5 MG tablet TAKE 1 TABLET BY MOUTH AT BEDTIME AS NEEDED FOR SLEEP/ANXIETY 30 tablet 2  . aspirin-acetaminophen-caffeine (EXCEDRIN MIGRAINE) [id number] MG per tablet Take 2 tablets by mouth every 6 (six) hours as needed for pain.    Marland Kitchen CAMILA 0.35 MG tablet Take 1 tablet by mouth daily.  3  . Cholecalciferol (VITAMIN D PO) Take by mouth.    . DULoxetine (CYMBALTA) 60 MG capsule TAKE 1 CAPSULE EVERY DAY  30 capsule 5  . ibuprofen (ADVIL,MOTRIN) 200 MG tablet Take 600 mg by mouth every 6 (six) hours as needed for pain.    Marland Kitchen LINZESS 290 MCG CAPS capsule TAKE 1 CAPSULE BY MOUTH EVERY OTHER DAY 30 capsule 0  . oxyCODONE-acetaminophen (PERCOCET) 10-325 MG tablet Take 1 tablet by mouth every 8 (eight) hours as needed for pain (for migraines). 30 tablet 0  . promethazine (PHENERGAN) 25 MG tablet TAKE 1 TABELT BY MOUTH EVERY 4 HOURS 20 tablet 3   No current facility-administered medications on file prior to visit.     Allergies  Allergen Reactions  . Prednisone Shortness Of Breath  . Ceftin [Cefuroxime Axetil] Rash  . Sulfa Antibiotics Rash   Social History   Social History  . Marital status: Married    Spouse name: N/A  . Number of children: N/A  . Years of education: N/A   Occupational History  . Not on file.   Social History Main Topics  . Smoking status: Never Smoker  . Smokeless tobacco: Never Used  . Alcohol use No  . Drug use: No  . Sexual activity: Not Currently   Other Topics Concern  . Not on file   Social History Narrative  . No narrative on file       Review of Systems  All other systems reviewed and are negative.      Objective:   Physical Exam  Cardiovascular: Normal rate, regular rhythm and normal heart sounds.   Pulmonary/Chest: Effort normal and breath sounds normal. No respiratory distress. She has no wheezes. She has no rales.  Musculoskeletal:       Right hand: She exhibits bony tenderness. She exhibits normal range of motion, no tenderness, no deformity and no swelling. Normal sensation noted. Normal strength noted.       Left hand: She exhibits bony tenderness. She exhibits normal range of motion, no tenderness, no deformity and no swelling. Normal sensation noted. Normal strength noted.  Vitals reviewed.         Assessment & Plan:  Fibromyalgia - Plan: pregabalin (LYRICA) 50 MG capsule  Palpitations I believe the Andrea [name] had a panic attack. However I will schedule her to have a 24-hour Holter monitor to evaluate for any cardiac arrhythmias. I believe her diffuse body pain is likely fibromyalgia. She is recently lost a parent and her husband had hip replacement. I believe the increased stress could be exacerbating her fibromyalgia pain. I will have her continue Cymbalta at the present time and add Lyrica 50 mg by mouth 3 times a day. I will uptitrate Lyrica as necessary for pain control

## 2016-10-30 ENCOUNTER — Telehealth: Payer: Self-pay | Admitting: Family Medicine

## 2016-10-30 NOTE — Telephone Encounter (Signed)
Holter Monitor results as per Dr. Dennard Schaumann [facility] news, no significant arrhythmia. Does show some benign PVC's.   Pt aware of results via vm on cell # [phone]

## 2016-11-14 ENCOUNTER — Other Ambulatory Visit: Payer: Self-pay | Admitting: Family Medicine

## 2016-11-15 NOTE — Telephone Encounter (Signed)
Ok to refill 

## 2016-11-16 NOTE — Telephone Encounter (Signed)
Medication called to pharmacy. 

## 2016-11-16 NOTE — Telephone Encounter (Signed)
ok 

## 2016-11-17 ENCOUNTER — Ambulatory Visit: Payer: BC Managed Care – PPO | Admitting: Family Medicine

## 2016-11-23 ENCOUNTER — Telehealth: Payer: Self-pay | Admitting: Family Medicine

## 2016-11-23 NOTE — Telephone Encounter (Signed)
Requesting a refill on oxycodone - Ok to refill??

## 2016-11-23 NOTE — Telephone Encounter (Signed)
ok 

## 2016-11-24 MED ORDER — OXYCODONE-ACETAMINOPHEN 10-325 MG PO TABS: 1.0000 | ORAL_TABLET | Freq: Three times a day (TID) | ORAL | 0 refills | Status: DC | PRN

## 2016-11-24 NOTE — Telephone Encounter (Signed)
RX printed, left up front and patient aware to pick up  

## 2016-11-28 ENCOUNTER — Ambulatory Visit (INDEPENDENT_AMBULATORY_CARE_PROVIDER_SITE_OTHER): Payer: BC Managed Care – PPO | Admitting: Family Medicine

## 2016-11-28 ENCOUNTER — Encounter: Payer: Self-pay | Admitting: Family Medicine

## 2016-11-28 VITALS — BP 120/74 | HR 76 | Temp 98.6°F | Resp 18 | Ht 60.0 in | Wt 184.0 lb

## 2016-11-28 DIAGNOSIS — J208 Acute bronchitis due to other specified organisms: Secondary | ICD-10-CM

## 2016-11-28 MED ORDER — HYDROCODONE-HOMATROPINE 5-1.5 MG/5ML PO SYRP: 5.0000 mL | ORAL_SOLUTION | Freq: Three times a day (TID) | ORAL | 0 refills | Status: DC | PRN

## 2016-11-28 MED ORDER — PREDNISONE 20 MG PO TABS: ORAL_TABLET | ORAL | 0 refills | Status: DC

## 2016-11-28 NOTE — Progress Notes (Signed)
Subjective:    Andrea [name] ID: Andrea Andrea [name], female    DOB: October 29, 1971, 45 y.o.   MRN: [id number]  HPI Andrea [name] is a 45 year old white female who presents with a one-week history of cough, cough productive of yellow sputum, subjective fevers, worsening asthma, increased wheezing, some mild shortness of breath. She works in an [employer] and is exposed to children with upper respiratory infections. Past Medical History:  Diagnosis Date  . Angiolipoma of kidney   . Arthritis    "hands" (09/02/2013)  . B12 deficiency   . Epicondylitis, lateral    Right  . GERD (gastroesophageal reflux disease)   . IBS (irritable bowel syndrome)   . Migraine headache    "maybe 6-8 times/yr" (09/02/2013)  . Shortness of breath    "all the time when I came in today" (09/02/2013)   Past Surgical History:  Procedure Laterality Date  . APPENDECTOMY  1984  . PALATAL EXPANSION  2004  . REDUCTION MAMMAPLASTY Bilateral ~ 2009   Current Outpatient Prescriptions on File Prior to Visit  Medication Sig Dispense Refill  . Albuterol Sulfate (PROAIR RESPICLICK) 123XX123 (90 BASE) MCG/ACT AEPB Inhale 2 puffs into the lungs every 4 (four) hours. 1 each 0  . ALPRAZolam (XANAX) 0.5 MG tablet TAKE 1 TABLET BY MOUTH AT BEDTIME AS NEEDED FOR SLEEP/ANXIETY 30 tablet 2  . aspirin-acetaminophen-caffeine (EXCEDRIN MIGRAINE) [id number] MG per tablet Take 2 tablets by mouth every 6 (six) hours as needed for pain.    Marland Kitchen CAMILA 0.35 MG tablet Take 1 tablet by mouth daily.  3  . Cholecalciferol (VITAMIN D PO) Take by mouth.    . DULoxetine (CYMBALTA) 60 MG capsule TAKE 1 CAPSULE EVERY DAY 30 capsule 5  . ibuprofen (ADVIL,MOTRIN) 200 MG tablet Take 600 mg by mouth every 6 (six) hours as needed for pain.    Marland Kitchen LINZESS 290 MCG CAPS capsule TAKE 1 CAPSULE BY MOUTH EVERY OTHER DAY 30 capsule 0  . oxyCODONE-acetaminophen (PERCOCET) 10-325 MG tablet Take 1 tablet by mouth every 8 (eight) hours as needed for pain (for migraines). 30 tablet  0  . pregabalin (LYRICA) 50 MG capsule Take 1 capsule (50 mg total) by mouth 3 (three) times daily. 90 capsule 1  . promethazine (PHENERGAN) 25 MG tablet TAKE 1 TABELT BY MOUTH EVERY 4 HOURS 20 tablet 3   No current facility-administered medications on file prior to visit.    Allergies  Allergen Reactions  . Prednisone Shortness Of Breath  . Ceftin [Cefuroxime Axetil] Rash  . Sulfa Antibiotics Rash   Social History   Social History  . Marital status: Married    Spouse name: N/A  . Number of children: N/A  . Years of education: N/A   Occupational History  . Not on file.   Social History Main Topics  . Smoking status: Never Smoker  . Smokeless tobacco: Never Used  . Alcohol use No  . Drug use: No  . Sexual activity: Not Currently   Other Topics Concern  . Not on file   Social History Narrative  . No narrative on file      Review of Systems  All other systems reviewed and are negative.      Objective:   Physical Exam  Constitutional: She appears well-developed and well-nourished.  HENT:  Right Ear: External ear normal.  Left Ear: External ear normal.  Nose: Mucosal edema and rhinorrhea present.  Mouth/Throat: Oropharynx is clear and moist. No oropharyngeal exudate.  Eyes: Conjunctivae are normal.  Cardiovascular: Normal rate, regular rhythm and normal heart sounds.   Pulmonary/Chest: Effort normal. No respiratory distress. She has wheezes. She has no rales.  Lymphadenopathy:    She has no cervical adenopathy.  Vitals reviewed.         Assessment & Plan:  Acute viral bronchitis - Plan: predniSONE (DELTASONE) 20 MG tablet, HYDROcodone-homatropine (HYCODAN) 5-1.5 MG/5ML syrup  Andrea [name] appears to have viral bronchitis. I recommended Mucinex DM for cough. Tincture of time. She needs albuterol 2 puffs inhaled every 6 hours as needed for wheezing. I did give the Andrea [name] Hycodan to use for cough at night. Should the wheezing worsens, Andrea [name] can use a  prednisone taper pack for an asthma exacerbation. Hopefully the virus will run its course without her having it to use a prednisone. I see no indication for serious bacterial illness

## 2016-12-29 ENCOUNTER — Telehealth: Payer: Self-pay | Admitting: Family Medicine

## 2016-12-29 MED ORDER — PREGABALIN 75 MG PO CAPS: 75.0000 mg | ORAL_CAPSULE | Freq: Three times a day (TID) | ORAL | 3 refills | Status: DC

## 2016-12-29 NOTE — Telephone Encounter (Signed)
Medication called/sent to requested pharmacy  

## 2016-12-29 NOTE — Telephone Encounter (Signed)
Pt called LMOVM stating with the cold weather she has had a lot more pain with her fibromyalgia and would like to know if there is something else she can take? She takes her percocet vary sparingly and does not want to take it everyday if she can help it.

## 2016-12-29 NOTE — Telephone Encounter (Signed)
We could increase lyrica to 75 tid.

## 2016-12-29 NOTE — Telephone Encounter (Signed)
Patient aware of providers recommendations and informed to call back if she want to increase dose via vm

## 2016-12-29 NOTE — Telephone Encounter (Signed)
Pt states it is okay to increase dosage please call in prescription.

## 2017-01-08 ENCOUNTER — Ambulatory Visit (INDEPENDENT_AMBULATORY_CARE_PROVIDER_SITE_OTHER): Payer: BC Managed Care – PPO | Admitting: Family Medicine

## 2017-01-08 ENCOUNTER — Encounter: Payer: Self-pay | Admitting: Family Medicine

## 2017-01-08 VITALS — BP 110/78 | HR 84 | Temp 98.8°F | Resp 16 | Ht 60.0 in | Wt 186.0 lb

## 2017-01-08 DIAGNOSIS — M797 Fibromyalgia: Secondary | ICD-10-CM

## 2017-01-08 NOTE — Progress Notes (Signed)
Subjective:    Andrea [name] ID: Andrea Andrea [name], female    DOB: [birth year], 46 y.o.   MRN: [id number]  HPI  4/17 The Andrea [name] reports severe pain in both hands distal to the wrist. She reports pain in stiffness in the PIP joints and DIP joints every morning. She reports pain in both shoulders. There are no exacerbating or alleviating factors. She denies any recent injuries. She does have a past medical history of fibromyalgia. On examination today there is no erythema in the joints on her hands. There is no ulnar deviation. There are no palpable rheumatoid nodules. There is no warmth in the joints or obvious signs of tenosynovitis. She has full range of motion of the shoulders and elbows. Exam is unremarkable.  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 Rule out rheumatoid arthritis with a sedimentation rate and rheumatoid factor. Check a CBC to evaluate for any signs of leukocytosis or other bone marrow abnormalities. Obtain x-rays of both hands area if x-rays confirm osteoarthritis, I would recommend NSAIDs. She does have a positive Tinel sign today and also a positive Phalen sign so atypical carpal tunnel can also be a possibility. Consider nerve conduction test if workup is unremarkable.  10/20/16 The Andrea [name]'s lab work was unremarkable and x-rays were unremarkable. A rheumatology consultation did not find evidence of any rheumatologic disorder. My clinical impression is that the Andrea [name] has fibromyalgia syndrome complicated with migraines. Over the last few weeks, she has developed diffuse pain all over her body. The pain is present in both shoulders and in both elbows. She is having daily headaches. She also has pain in her hands and in her knees. She is interested in better options for pain control. She is having use Percocet for migraines and she is using it more frequently due to the frequency of migraines.  She also reports an incident where she felt her heart began to race. She became very lightheaded and felt like she was going to pass out. She broke out in a sweat. Symptoms resolved after a few minutes. Symptoms sound like a panic attack. At that time, my plan was: I believe the Andrea [name] had a panic attack. However I will schedule her to have a 24-hour Holter monitor to evaluate for any cardiac arrhythmias. I believe her diffuse body pain is likely fibromyalgia. She is recently lost a parent and her husband had hip replacement. I believe the increased stress could be exacerbating her fibromyalgia pain. I will have her continue Cymbalta at the present time and add Lyrica 50 mg by mouth 3 times a day. I will uptitrate Lyrica as necessary for pain control  01/08/17 Andrea [name] saw no benefit on the combination of Cymbalta and Lyrica and therefore I recommended weaning the Andrea [name] off gradually over the next 2 weeks. Since weaning off the Cymbalta 2 weeks ago, the Andrea [name] has noticed increasing fatigue, dizziness, blurry vision, and apathy. Sounds to me like the Andrea [name] may be experiencing withdrawal symptoms from the Cymbalta. She has increase Lyrica to 75 mg 3 times a day but has seen no benefit with regards to pain. She continues to have pain in her shoulders, biceps, forearms, hips, and hamstrings almost on a daily basis. Past Medical History:  Diagnosis Date  . Angiolipoma of kidney   . Arthritis    "hands" (09/02/2013)  . B12 deficiency   . Epicondylitis, lateral    Right  .  GERD (gastroesophageal reflux disease)   . IBS (irritable bowel syndrome)   . Migraine headache    "maybe 6-8 times/yr" (09/02/2013)  . Shortness of breath    "all the time when I came in today" (09/02/2013)   Past Surgical History:  Procedure Laterality Date  . APPENDECTOMY  1984  . PALATAL EXPANSION  2004  . REDUCTION MAMMAPLASTY Bilateral ~ 2009   Current Outpatient Prescriptions on File Prior to Visit  Medication Sig  Dispense Refill  . Albuterol Sulfate (PROAIR RESPICLICK) 123XX123 (90 BASE) MCG/ACT AEPB Inhale 2 puffs into the lungs every 4 (four) hours. 1 each 0  . ALPRAZolam (XANAX) 0.5 MG tablet TAKE 1 TABLET BY MOUTH AT BEDTIME AS NEEDED FOR SLEEP/ANXIETY 30 tablet 2  . aspirin-acetaminophen-caffeine (EXCEDRIN MIGRAINE) [id number] MG per tablet Take 2 tablets by mouth every 6 (six) hours as needed for pain.    Marland Kitchen CAMILA 0.35 MG tablet Take 1 tablet by mouth daily.  3  . Cholecalciferol (VITAMIN D PO) Take by mouth.    . DULoxetine (CYMBALTA) 60 MG capsule TAKE 1 CAPSULE EVERY DAY 30 capsule 5  . ibuprofen (ADVIL,MOTRIN) 200 MG tablet Take 600 mg by mouth every 6 (six) hours as needed for pain.    Marland Kitchen LINZESS 290 MCG CAPS capsule TAKE 1 CAPSULE BY MOUTH EVERY OTHER DAY 30 capsule 0  . oxyCODONE-acetaminophen (PERCOCET) 10-325 MG tablet Take 1 tablet by mouth every 8 (eight) hours as needed for pain (for migraines). 30 tablet 0  . pregabalin (LYRICA) 75 MG capsule Take 1 capsule (75 mg total) by mouth 3 (three) times daily. 90 capsule 3  . promethazine (PHENERGAN) 25 MG tablet TAKE 1 TABELT BY MOUTH EVERY 4 HOURS 20 tablet 3   No current facility-administered medications on file prior to visit.    Allergies  Allergen Reactions  . Prednisone Shortness Of Breath  . Ceftin [Cefuroxime Axetil] Rash  . Sulfa Antibiotics Rash   Social History   Social History  . Marital status: Married    Spouse name: N/A  . Number of children: N/A  . Years of education: N/A   Occupational History  . Not on file.   Social History Main Topics  . Smoking status: Never Smoker  . Smokeless tobacco: Never Used  . Alcohol use No  . Drug use: No  . Sexual activity: Not Currently   Other Topics Concern  . Not on file   Social History Narrative  . No narrative on file       Review of Systems  All other systems reviewed and are negative.      Objective:   Physical Exam  Cardiovascular: Normal rate, regular  rhythm and normal heart sounds.   Pulmonary/Chest: Effort normal and breath sounds normal. No respiratory distress. She has no wheezes. She has no rales.  Musculoskeletal:       Right hand: She exhibits bony tenderness. She exhibits normal range of motion, no tenderness, no deformity and no swelling. Normal sensation noted. Normal strength noted.       Left hand: She exhibits bony tenderness. She exhibits normal range of motion, no tenderness, no deformity and no swelling. Normal sensation noted. Normal strength noted.  Vitals reviewed.         Assessment & Plan:  Fibromyalgia  I believe the majority of the Andrea [name]'s symptoms today are due to withdrawal from Cymbalta rather than her fibromyalgia or rather than side effects of Lyrica increasing from 50-75. Therefore I want her to  resume Cymbalta 60 mg a day and recheck on Friday. If her symptoms have improved, I will have the Andrea [name] continue the Cymbalta and Lyrica and treat her on an as-needed basis with muscle relaxers for muscle pain

## 2017-01-12 ENCOUNTER — Telehealth: Payer: Self-pay | Admitting: Family Medicine

## 2017-01-12 NOTE — Telephone Encounter (Signed)
Pt called LMOVM stating that she is taking both medications and she is still have arm and hand pain and they make her extremely sleepy. She was wondering about a muscle relaxer that you had mentioned to her if that would help?

## 2017-01-15 MED ORDER — CYCLOBENZAPRINE HCL 10 MG PO TABS: 10.0000 mg | ORAL_TABLET | Freq: Three times a day (TID) | ORAL | 1 refills | Status: DC | PRN

## 2017-01-15 NOTE — Telephone Encounter (Signed)
Flexeril 10 q 8 hrs prn pain

## 2017-01-15 NOTE — Telephone Encounter (Signed)
Medication called/sent to requested pharmacy and pt aware via mychart 

## 2017-01-24 ENCOUNTER — Telehealth: Payer: Self-pay | Admitting: Family Medicine

## 2017-01-24 MED ORDER — OXYCODONE-ACETAMINOPHEN 10-325 MG PO TABS: 1.0000 | ORAL_TABLET | Freq: Three times a day (TID) | ORAL | 0 refills | Status: DC | PRN

## 2017-01-24 NOTE — Telephone Encounter (Signed)
Patient requesting a refill on Oxycodone - Ok to refill??       LRF - 11/24/16

## 2017-01-25 NOTE — Telephone Encounter (Signed)
RX printed, left up front and patient aware to pick up via vm 

## 2017-01-25 NOTE — Telephone Encounter (Signed)
ok 

## 2017-03-12 ENCOUNTER — Other Ambulatory Visit: Payer: Self-pay | Admitting: Family Medicine

## 2017-03-13 NOTE — Telephone Encounter (Signed)
RX called in .

## 2017-03-13 NOTE — Telephone Encounter (Signed)
ok 

## 2017-03-13 NOTE — Telephone Encounter (Signed)
Ok to refill 

## 2017-03-16 ENCOUNTER — Other Ambulatory Visit: Payer: Self-pay | Admitting: Family Medicine

## 2017-03-16 NOTE — Telephone Encounter (Signed)
Medication refilled per protocol. 

## 2017-03-21 ENCOUNTER — Telehealth: Payer: Self-pay | Admitting: Family Medicine

## 2017-03-21 NOTE — Telephone Encounter (Signed)
Pt called and states that for the past 3 weeks she has not been able to sleep and was not sure what she could take without taking too much medication. She takes a muscle relaxer prn neck spasms and xanax and was not sure if she could take those together. She was not sure what meds she could mix. She states that the Xanax does not help her with her sleep any more and is wondering what else she could take?   [phone] - OK per pt to leave a detailed message  - Pharmacy [place]

## 2017-03-22 MED ORDER — TRAZODONE HCL 50 MG PO TABS
50.0000 mg | ORAL_TABLET | Freq: Every evening | ORAL | 0 refills | Status: DC | PRN
Start: 2017-03-22 — End: 2017-04-18

## 2017-03-22 NOTE — Telephone Encounter (Signed)
Rather than take a muscle relaxer to sleep, try trazodone 50 poqhs prn insomnia

## 2017-03-22 NOTE — Telephone Encounter (Signed)
Medication called/sent to requested pharmacy and pt aware via vm 

## 2017-04-10 ENCOUNTER — Telehealth: Payer: Self-pay | Admitting: Family Medicine

## 2017-04-10 MED ORDER — OXYCODONE-ACETAMINOPHEN 10-325 MG PO TABS: 1.0000 | ORAL_TABLET | Freq: Three times a day (TID) | ORAL | 0 refills | Status: DC | PRN

## 2017-04-10 NOTE — Telephone Encounter (Signed)
ok 

## 2017-04-10 NOTE — Telephone Encounter (Signed)
Patient is requesting a refill on Hydrocodone - Ok to refill??  CB# [phone] and ok to leave message if no answer

## 2017-04-11 NOTE — Telephone Encounter (Signed)
RX printed, left up front and patient aware to pick up  

## 2017-04-18 ENCOUNTER — Other Ambulatory Visit: Payer: Self-pay | Admitting: Family Medicine

## 2017-05-17 ENCOUNTER — Other Ambulatory Visit: Payer: Self-pay | Admitting: Family Medicine

## 2017-05-22 ENCOUNTER — Telehealth: Payer: Self-pay | Admitting: Family Medicine

## 2017-05-22 NOTE — Telephone Encounter (Signed)
Pt called wanted to know if we could schedule a referral for her trigger finger and wants to start on diet medication she discussed last time. Per WTP has nott been seen since 1/18 and needs to be seen to document. Called pt and left detailed message on vm to schedule appt.

## 2017-05-25 ENCOUNTER — Ambulatory Visit (INDEPENDENT_AMBULATORY_CARE_PROVIDER_SITE_OTHER): Payer: BC Managed Care – PPO | Admitting: Family Medicine

## 2017-05-25 VITALS — BP 130/74 | HR 86 | Temp 98.3°F | Resp 14 | Ht 60.0 in | Wt 186.0 lb

## 2017-05-25 DIAGNOSIS — M6530 Trigger finger, unspecified finger: Secondary | ICD-10-CM

## 2017-05-25 DIAGNOSIS — Z6836 Body mass index (BMI) 36.0-36.9, adult: Secondary | ICD-10-CM

## 2017-05-25 MED ORDER — PHENTERMINE HCL 37.5 MG PO CAPS: 37.5000 mg | ORAL_CAPSULE | ORAL | 2 refills | Status: DC

## 2017-05-25 NOTE — Progress Notes (Signed)
Subjective:    [name] ID: Andrea [name], female    DOB: 1971/12/01, 46 y.o.   MRN: [id number]  HPI  [name] is here today to discuss weight loss. She is not engaging in any regular aerobic exercise. She is not following any specific diet but she is trying to make better food choices whenever possible. Her BMI today is greater than 36. Her weight also complicates her fibromyalgia because the weight increases her joint pains. She is concerned because now she's having a difficult time even getting out of the bathtub. She feels that she can lose weight, she will be able to move better and with less pain. She is motivated to make lifestyle changes to try to address this. She also has a trigger finger in her right hand and the third joint. The third finger will lock on occasion when flexed. She is requesting an injection for trigger finger.pmh Past Surgical History:  Procedure Laterality Date  . APPENDECTOMY  1984  . PALATAL EXPANSION  2004  . REDUCTION MAMMAPLASTY Bilateral ~ 2009   Current Outpatient Prescriptions on File Prior to Visit  Medication Sig Dispense Refill  . Albuterol Sulfate (PROAIR RESPICLICK) 734 (90 BASE) MCG/ACT AEPB Inhale 2 puffs into the lungs every 4 (four) hours. 1 each 0  . ALPRAZolam (XANAX) 0.5 MG tablet TAKE 1 TABLET BY MOUTH AT BEDTIME AS NEEDED FOR SLEEP OR ANXIETY 30 tablet 2  . aspirin-acetaminophen-caffeine (EXCEDRIN MIGRAINE) 193-790-24 MG per tablet Take 2 tablets by mouth every 6 (six) hours as needed for pain.    Marland Kitchen CAMILA 0.35 MG tablet Take 1 tablet by mouth daily.  3  . Cholecalciferol (VITAMIN D PO) Take by mouth.    . cyclobenzaprine (FLEXERIL) 10 MG tablet Take 1 tablet (10 mg total) by mouth every 8 (eight) hours as needed for muscle spasms. 60 tablet 1  . DULoxetine (CYMBALTA) 60 MG capsule TAKE 1 CAPSULE BY MOUTH ONCE DAILY 30 capsule 2  . ibuprofen (ADVIL,MOTRIN) 200 MG tablet Take 600 mg by mouth every 6 (six) hours as needed for pain.    Marland Kitchen  LINZESS 290 MCG CAPS capsule TAKE 1 CAPSULE BY MOUTH EVERY OTHER DAY 30 capsule 0  . oxyCODONE-acetaminophen (PERCOCET) 10-325 MG tablet Take 1 tablet by mouth every 8 (eight) hours as needed for pain (for migraines). 30 tablet 0  . pregabalin (LYRICA) 75 MG capsule Take 1 capsule (75 mg total) by mouth 3 (three) times daily. 90 capsule 3  . promethazine (PHENERGAN) 25 MG tablet TAKE 1 TABELT BY MOUTH EVERY 4 HOURS 20 tablet 3  . traZODone (DESYREL) 50 MG tablet TAKE 1 TABLET (50 MG TOTAL) BY MOUTH AT BEDTIME AS NEEDED FOR SLEEP. 30 tablet 0   No current facility-administered medications on file prior to visit.    Allergies  Allergen Reactions  . Prednisone Shortness Of Breath  . Ceftin [Cefuroxime Axetil] Rash  . Sulfa Antibiotics Rash   Social History   Social History  . Marital status: Married    Spouse name: N/A  . Number of children: N/A  . Years of education: N/A   Occupational History  . Not on file.   Social History Main Topics  . Smoking status: Never Smoker  . Smokeless tobacco: Never Used  . Alcohol use No  . Drug use: No  . Sexual activity: Not Currently   Other Topics Concern  . Not on file   Social History Narrative  . No narrative on file  Review of Systems  All other systems reviewed and are negative.      Objective:   Physical Exam  Cardiovascular: Normal rate, regular rhythm and normal heart sounds.   Pulmonary/Chest: Effort normal and breath sounds normal.  Musculoskeletal:       Right hand: She exhibits normal range of motion, no tenderness, no bony tenderness and no deformity. Normal sensation noted. Normal strength noted.       Hands: Vitals reviewed.         Assessment & Plan:  Trigger finger of right hand, unspecified finger  BMI 36.0-36.9,adult  We discussed weight loss strategies including less than 1200 cal a day, Weight Watchers, and 30 minutes to an hour a day of aerobic exercise. She would also like to try phentermine  37.5 mg poqam for 90 days.  Using sterile technique, I injected the right third digit at the palmar side of the flexor crease of the MCP joint. I injected adjacent to the flexor tendon but not directly into the flexor tendon.

## 2017-06-22 ENCOUNTER — Other Ambulatory Visit: Payer: Self-pay | Admitting: Family Medicine

## 2017-07-05 ENCOUNTER — Telehealth: Payer: Self-pay | Admitting: Family Medicine

## 2017-07-05 MED ORDER — OXYCODONE-ACETAMINOPHEN 10-325 MG PO TABS: 1.0000 | ORAL_TABLET | Freq: Three times a day (TID) | ORAL | 0 refills | Status: DC | PRN

## 2017-07-05 NOTE — Telephone Encounter (Signed)
Patient requesting a refill on Oxycodone - Ok to refill??        

## 2017-07-05 NOTE — Telephone Encounter (Signed)
ok 

## 2017-07-06 NOTE — Telephone Encounter (Signed)
RX printed, left up front and patient aware to pick up via vm 

## 2017-07-12 ENCOUNTER — Other Ambulatory Visit: Payer: Self-pay | Admitting: Family Medicine

## 2017-08-09 ENCOUNTER — Telehealth: Payer: Self-pay | Admitting: Family Medicine

## 2017-08-09 DIAGNOSIS — G43809 Other migraine, not intractable, without status migrainosus: Secondary | ICD-10-CM

## 2017-08-09 NOTE — Telephone Encounter (Signed)
Pt called and states that since starting back at work she is having daily migraines and wanted to know your opinion on botox injections. Wanted to know if you thought they would work for her or not. And if so where should she go to have this done.

## 2017-08-09 NOTE — Telephone Encounter (Signed)
There is evidence for botox preventing migraines.  I think they can help.  I usually use topamax.

## 2017-08-10 MED ORDER — ALBUTEROL SULFATE 108 (90 BASE) MCG/ACT IN AEPB: 2.0000 | INHALATION_SPRAY | RESPIRATORY_TRACT | 3 refills | Status: DC

## 2017-08-10 NOTE — Telephone Encounter (Signed)
Pt has tried Topamax and could not tolerate it and would like a referral to headache clinic. Referral placed.

## 2017-08-15 ENCOUNTER — Other Ambulatory Visit: Payer: Self-pay | Admitting: Family Medicine

## 2017-08-15 ENCOUNTER — Ambulatory Visit (INDEPENDENT_AMBULATORY_CARE_PROVIDER_SITE_OTHER): Payer: BC Managed Care – PPO | Admitting: Family Medicine

## 2017-08-15 ENCOUNTER — Encounter: Payer: Self-pay | Admitting: Family Medicine

## 2017-08-15 VITALS — BP 102/88 | HR 92 | Temp 98.3°F | Resp 16 | Ht 60.0 in | Wt 182.2 lb

## 2017-08-15 DIAGNOSIS — I456 Pre-excitation syndrome: Secondary | ICD-10-CM

## 2017-08-15 DIAGNOSIS — R079 Chest pain, unspecified: Secondary | ICD-10-CM | POA: Diagnosis not present

## 2017-08-15 DIAGNOSIS — M797 Fibromyalgia: Secondary | ICD-10-CM

## 2017-08-15 LAB — CBC
HEMATOCRIT: 39.6 % (ref 35.0–45.0)
HEMOGLOBIN: 13 g/dL (ref 11.7–15.5)
MCH: 27.7 pg (ref 27.0–33.0)
MCHC: 32.8 g/dL (ref 32.0–36.0)
MCV: 84.3 fL (ref 80.0–100.0)
Platelets: 242 10*3/uL (ref 140–400)
RBC: 4.7 10*6/uL (ref 3.80–5.10)
RDW: 14.1 % (ref 11.0–15.0)
WBC: 8.1 10*3/uL (ref 3.8–10.8)

## 2017-08-15 LAB — COMPREHENSIVE METABOLIC PANEL
AG RATIO: 1.9 (calc) (ref 1.0–2.5)
ALT: 15 U/L (ref 6–29)
AST: 17 U/L (ref 10–35)
Albumin: 4.2 g/dL (ref 3.6–5.1)
Alkaline phosphatase (APISO): 82 U/L (ref 33–115)
BUN: 13 mg/dL (ref 7–25)
CHLORIDE: 105 mmol/L (ref 98–110)
CO2: 28 mmol/L (ref 20–32)
Calcium: 9.4 mg/dL (ref 8.6–10.2)
Creat: 0.66 mg/dL (ref 0.50–1.10)
GLOBULIN: 2.2 g/dL (ref 1.9–3.7)
GLUCOSE: 88 mg/dL (ref 65–99)
POTASSIUM: 4.2 mmol/L (ref 3.5–5.3)
SODIUM: 140 mmol/L (ref 135–146)
TOTAL PROTEIN: 6.4 g/dL (ref 6.1–8.1)
Total Bilirubin: 0.4 mg/dL (ref 0.2–1.2)

## 2017-08-15 LAB — TSH: TSH: 2.66 mIU/L

## 2017-08-15 MED ORDER — NITROGLYCERIN 0.4 MG SL SUBL: 0.4000 mg | SUBLINGUAL_TABLET | SUBLINGUAL | 3 refills | Status: DC | PRN

## 2017-08-15 NOTE — Patient Instructions (Signed)
Take the nitroglycerin as needed Cardiology referral F/u pending results

## 2017-08-15 NOTE — Progress Notes (Signed)
Subjective:    [name] ID: Andrea [name], female    DOB: [birth year], 46 y.o.   MRN: [id number]  [name] presents for trouble breathing (x1week); trouble sleeping; and Back Pain Is here with chest discomfort difficulty breathing for the past few days. On Friday states that she became very hot and flushed while she was at school she tends to get this way when she gets overheated She began having chest pressure like someone sitting on her chest. She tried to get cool when she got home he still felt flushed and had diaphoresis. She also had some mild nausea and finally used off the pressure came back the next day. She's also had episodes of palpitations She thought it may be related to her asthma so she used her inhaler but there was no improvement. She's not had any significant cough she never took her temperature to see if she had a fever. She just has not felt good. She tries to lay down at nighttime she has more discomfort and often has to prop up on a couple of pillows. She has not noticed any swelling in her lower extremities or abdomen area. She does have underlying fibromyalgia as well as anxiety. She also has migraines and takes oxycodone as needed.   Father massive MI age 57  Sister- has episodes of tachycardia.    Review Of Systems:  GEN- denies fatigue, fever, weight loss,weakness, recent illness HEENT- denies eye drainage, change in vision, nasal discharge, CVS- +chest pain,+ palpitations RESP- + SOB, denies cough, wheeze ABD- denies N/V, change in stools, abd pain GU- denies dysuria, hematuria, dribbling, incontinence MSK- denies joint pain, muscle aches, injury Neuro- denies headache, dizziness, syncope, seizure activity       Objective:    BP 102/88   Pulse 92   Temp 98.3 F (36.8 C) (Oral)   Resp 16   Ht 5' (1.524 m)   Wt 182 lb 3.2 oz (82.6 kg)   SpO2 98%   BMI 35.58 kg/m  GEN- NAD, alert and oriented x3 HEENT- PERRL, EOMI, non injected sclera, pink  conjunctiva, MMM, oropharynx clear Neck- Supple, no thyromegaly CVS- RRR, no murmurm, mild TTP chest wall  RESP-CTAB ABD-NABS,soft,NT,ND Neuro-CNII-XII intact no deficits  Psych- normal affect and mood EXT- No edema Pulses- Radial, DP- 2+   EKG-NSR, PR 110, no ST changes      Assessment & Plan:      Problem List Items Addressed This Visit      Unprioritized   Fibromyalgia    Other Visit Diagnoses    Chest pain, unspecified type    -  Primary   Family history of cardiac disease, short PR with palpitations she could be having some type of arrythemia, she has tenderness on exam sos also MSK component with Her fibromyalgia underlying. This is difficult to tease out. When I have her evaluated by cardiology. I did go ahead and give her some nitroglycerin if she has any severe chest pain or  pressure. She does not give me any signs of reflux so we'll hold off on treating that. She is also still taking her anxiety medicines as prescribed. We'll also check some labs.    Relevant Orders   CBC (Completed)   Comprehensive metabolic panel   EKG 81-WEXH (Completed)   Ambulatory referral to Cardiology   TSH   Short PR-normal QRS complex syndrome       Relevant Medications   nitroGLYCERIN (NITROSTAT) 0.4 MG SL tablet   Other Relevant  Orders   Ambulatory referral to Cardiology   TSH      Note: This dictation was prepared with Dragon dictation along with smaller phrase technology. Any transcriptional errors that result from this process are unintentional.

## 2017-08-16 ENCOUNTER — Ambulatory Visit (INDEPENDENT_AMBULATORY_CARE_PROVIDER_SITE_OTHER): Payer: BC Managed Care – PPO | Admitting: Cardiology

## 2017-08-16 ENCOUNTER — Encounter: Payer: Self-pay | Admitting: Cardiology

## 2017-08-16 VITALS — BP 118/78 | HR 91 | Ht 60.0 in | Wt 179.8 lb

## 2017-08-16 DIAGNOSIS — Z8249 Family history of ischemic heart disease and other diseases of the circulatory system: Secondary | ICD-10-CM | POA: Diagnosis not present

## 2017-08-16 DIAGNOSIS — R079 Chest pain, unspecified: Secondary | ICD-10-CM | POA: Diagnosis not present

## 2017-08-16 NOTE — Progress Notes (Signed)
Cardiology Office Note:    Date:  08/16/2017   ID:  Andrea Andrea [name], DOB 1971-10-20, MRN [id number]  PCP:  Andrea Frizzle, MD  Cardiologist:  Andrea Lindau, MD   Referring MD: Andrea Rossetti, MD    ASSESSMENT:    1. Chest pain with low risk for cardiac etiology   2. Family history of early CAD    PLAN:    In order of problems listed above:  1. I reassured the Andrea [name] about my findings. I agree with primary care physician. Her symptoms are not very typical of coronary etiology. The Andrea [name] is happy to know about this. I would. Objective evidence of her evaluation by doing a stress echo. Echocardiogram will also be done to assess murmur heard on auscultation. She knows to go to the nearest emergency room for any significant concerns. 2. The Andrea [name] has not had a fasting lipid profile and she will have a Chem-7 liver lipid check in the next few days. She will be seen in follow-up appointment in a month or earlier if she has any concerns. She does not want to get a prescription of nitroglycerin and she has not picked it up 1 which was sent from the primary care physician's office for fear of getting a migraine episode with nitroglycerin.   Medication Adjustments/Labs and Tests Ordered: Current medicines are reviewed at length with the Andrea [name] today.  Concerns regarding medicines are outlined above.  Orders Placed This Encounter  Procedures  . ECHOCARDIOGRAM STRESS TEST  . ECHOCARDIOGRAM COMPLETE   No orders of the defined types were placed in this encounter.    History of Present Illness:    Andrea Andrea [name] is a 46 y.o. female who is being seen today for the evaluation of Chest pain at the request of Andrea Andrea [name], Andrea Nunnery, MD. Andrea [name] is a pleasant 46 year old female. She does not have any history of essential hypertension or dyslipidemia or diabetes mellitus. She mentions to me that she has history of fibromyalgia and migraines. She notices chest tightness at times. This is  not related to exertion. She tells me that she has easy fatigability in the past few days. No orthopnea or PND. Sure chest tightness is not related to exertion and there is no radiation to the neck or to the arms. Her husband accompanies her for this visit.  Past Medical History:  Diagnosis Date  . Angiolipoma of kidney   . Arthritis    "hands" (09/02/2013)  . B12 deficiency   . Epicondylitis, lateral    Right  . GERD (gastroesophageal reflux disease)   . IBS (irritable bowel syndrome)   . Migraine headache    "maybe 6-8 times/yr" (09/02/2013)  . Shortness of breath    "all the time when I came in today" (09/02/2013)    Past Surgical History:  Procedure Laterality Date  . APPENDECTOMY  1984  . PALATAL EXPANSION  2004  . REDUCTION MAMMAPLASTY Bilateral ~ 2009    Current Medications: Current Meds  Medication Sig  . Albuterol Sulfate (PROAIR RESPICLICK) 357 (90 Base) MCG/ACT AEPB Inhale 2 puffs into the lungs every 4 (four) hours.  . ALPRAZolam (XANAX) 0.5 MG tablet TAKE 1 TABLET BY MOUTH AT BEDTIME AS NEEDED FOR SLEEP OR ANXIETY  . aspirin-acetaminophen-caffeine (EXCEDRIN MIGRAINE) 250-250-65 MG per tablet Take 2 tablets by mouth every 6 (six) hours as needed for pain.  Marland Kitchen CAMILA 0.35 MG tablet Take 1 tablet by mouth daily.  . Cholecalciferol (VITAMIN D PO) Take by  mouth.  . cyclobenzaprine (FLEXERIL) 10 MG tablet Take 1 tablet (10 mg total) by mouth every 8 (eight) hours as needed for muscle spasms.  . DULoxetine (CYMBALTA) 60 MG capsule TAKE 1 CAPSULE BY MOUTH EVERY DAY  . ibuprofen (ADVIL,MOTRIN) 200 MG tablet Take 600 mg by mouth every 6 (six) hours as needed for pain.  Marland Kitchen LINZESS 290 MCG CAPS capsule TAKE 1 CAPSULE BY MOUTH EVERY OTHER DAY  . LYRICA 75 MG capsule TAKE 1 CAPSULE BY MOUTH THREE TIMES A DAY  . nitroGLYCERIN (NITROSTAT) 0.4 MG SL tablet Place 1 tablet (0.4 mg total) under the tongue every 5 (five) minutes as needed for chest pain.  Marland Kitchen oxyCODONE-acetaminophen  (PERCOCET) 10-325 MG tablet Take 1 tablet by mouth every 8 (eight) hours as needed for pain (for migraines).  . phentermine 37.5 MG capsule Take 1 capsule (37.5 mg total) by mouth every morning.  . promethazine (PHENERGAN) 25 MG tablet TAKE 1 TABELT BY MOUTH EVERY 4 HOURS  . traZODone (DESYREL) 50 MG tablet TAKE 1 TABLET (50 MG TOTAL) BY MOUTH AT BEDTIME AS NEEDED FOR SLEEP.     Allergies:   Prednisone; Ceftin [cefuroxime axetil]; and Sulfa antibiotics   Social History   Social History  . Marital status: Married    Spouse name: N/A  . Number of children: N/A  . Years of education: N/A   Social History Main Topics  . Smoking status: Never Smoker  . Smokeless tobacco: Never Used  . Alcohol use No  . Drug use: No  . Sexual activity: Not Currently   Other Topics Concern  . None   Social History Narrative  . None     Family History: The Andrea [name]'s family history includes Arrhythmia in her paternal uncle; Coronary artery disease (age of onset: 108) in her father; Heart attack in her father; Irregular heart beat in her sister.  ROS:   Please see the history of present illness.    All other systems reviewed and are negative.  EKGs/Labs/Other Studies Reviewed:    The following studies were reviewed today: I reviewed records from primary care physician extensively.   Recent Labs: 08/15/2017: ALT 15; BUN 13; Creat 0.66; Hemoglobin 13.0; Platelets 242; Potassium 4.2; Sodium 140; TSH 2.66  Recent Lipid Panel    Component Value Date/Time   CHOL 160 09/02/2013 1110   TRIG 110 09/02/2013 1110   HDL 66 09/02/2013 1110   CHOLHDL 2.4 09/02/2013 1110   VLDL 22 09/02/2013 1110   LDLCALC 72 09/02/2013 1110    Physical Exam:    VS:  BP 118/78   Pulse 91   Ht 5' (1.524 m)   Wt 179 lb 12.8 oz (81.6 kg)   SpO2 98%   BMI 35.11 kg/m     Wt Readings from Last 3 Encounters:  08/16/17 179 lb 12.8 oz (81.6 kg)  08/15/17 182 lb 3.2 oz (82.6 kg)  05/25/17 186 lb (84.4 kg)     GEN:  Andrea [name] is in no acute distress HEENT: Normal NECK: No JVD; No carotid bruits LYMPHATICS: No lymphadenopathy CARDIAC: S1 S2 regular, 2/6 systolic murmur at the apex. RESPIRATORY:  Clear to auscultation without rales, wheezing or rhonchi  ABDOMEN: Soft, non-tender, non-distended MUSCULOSKELETAL:  No edema; No deformity  SKIN: Warm and dry NEUROLOGIC:  Alert and oriented x 3 PSYCHIATRIC:  Normal affect    Signed, Andrea Lindau, MD  08/16/2017 3:41 PM    [facility]

## 2017-08-16 NOTE — Addendum Note (Signed)
Addended by: Orland Penman on: 08/16/2017 03:54 PM   Modules accepted: Orders

## 2017-08-16 NOTE — Patient Instructions (Signed)
Medication Instructions:   Your physician recommends that you continue on your current medications as directed. Please refer to the Current Medication list given to you today.   Labwork:  NONE  Testing/Procedures:  Your physician has requested that you have a stress echocardiogram. For further information please visit [email or web address]. Please follow instruction sheet as given.  Your physician has requested that you have an echocardiogram. Echocardiography is a painless test that uses sound waves to create images of your heart. It provides your doctor with information about the size and shape of your heart and how well your heart's chambers and valves are working. This procedure takes approximately one hour. There are no restrictions for this procedure.  These test will be done at our main office at [place], [place],  [address]  Follow-Up:  Your physician recommends that you schedule a follow-up appointment in: 4 weeks with Dr. Geraldo Pitter.    Any Other Special Instructions Will Be Listed Below (If Applicable).     If you need a refill on your cardiac medications before your next appointment, please call your pharmacy.

## 2017-08-22 ENCOUNTER — Other Ambulatory Visit: Payer: Self-pay | Admitting: Family Medicine

## 2017-08-27 ENCOUNTER — Ambulatory Visit (INDEPENDENT_AMBULATORY_CARE_PROVIDER_SITE_OTHER): Payer: BC Managed Care – PPO | Admitting: Family Medicine

## 2017-08-27 ENCOUNTER — Encounter: Payer: Self-pay | Admitting: Family Medicine

## 2017-08-27 VITALS — BP 108/62 | HR 96 | Resp 16 | Ht 60.0 in | Wt 178.0 lb

## 2017-08-27 DIAGNOSIS — G43909 Migraine, unspecified, not intractable, without status migrainosus: Secondary | ICD-10-CM | POA: Diagnosis not present

## 2017-08-27 MED ORDER — METHYLPREDNISOLONE 4 MG PO TBPK: ORAL_TABLET | ORAL | 0 refills | Status: DC

## 2017-08-27 MED ORDER — KETOROLAC TROMETHAMINE 60 MG/2ML IM SOLN
60.0000 mg | Freq: Once | INTRAMUSCULAR | Status: AC
  Administered 2017-08-27: 60 mg via INTRAMUSCULAR

## 2017-08-27 MED ORDER — PROMETHAZINE HCL 25 MG/ML IJ SOLN
25.0000 mg | Freq: Once | INTRAMUSCULAR | Status: AC
  Administered 2017-08-27: 25 mg via INTRAMUSCULAR

## 2017-08-27 MED ORDER — BUTALBITAL-APAP-CAFF-COD 50-325-40-30 MG PO CAPS: 1.0000 | ORAL_CAPSULE | ORAL | 0 refills | Status: DC | PRN

## 2017-08-27 NOTE — Progress Notes (Signed)
Subjective:    [name] ID: Andrea [name], female    DOB: Dec 22, 1970, 46 y.o.   MRN: [id number]  HPI [name] presents today in status migrainosus. Headache has been present since last Thursday. That is 4 straight days. Headache is behind the right eye and radiates to the right occiput. It is pulsatile with photophobia and phonophobia. She has visual scotoma insufflate declines. She reports nausea but no vomiting. She is tried oxycodone and Phenergan without relief. In the past she has tried Imitrex, and Maxalt without relief. She denies any head injury. She denies any neurologic deficit. Past Medical History:  Diagnosis Date  . Angiolipoma of kidney   . Arthritis    "hands" (09/02/2013)  . B12 deficiency   . Epicondylitis, lateral    Right  . GERD (gastroesophageal reflux disease)   . IBS (irritable bowel syndrome)   . Migraine headache    "maybe 6-8 times/yr" (09/02/2013)  . Shortness of breath    "all the time when I came in today" (09/02/2013)   Past Surgical History:  Procedure Laterality Date  . APPENDECTOMY  1984  . PALATAL EXPANSION  2004  . REDUCTION MAMMAPLASTY Bilateral ~ 2009   Current Outpatient Prescriptions on File Prior to Visit  Medication Sig Dispense Refill  . Albuterol Sulfate (PROAIR RESPICLICK) 062 (90 Base) MCG/ACT AEPB Inhale 2 puffs into the lungs every 4 (four) hours. 1 each 3  . ALPRAZolam (XANAX) 0.5 MG tablet TAKE 1 TABLET BY MOUTH AT BEDTIME AS NEEDED FOR SLEEP OR ANXIETY 30 tablet 2  . aspirin-acetaminophen-caffeine (EXCEDRIN MIGRAINE) 694-854-62 MG per tablet Take 2 tablets by mouth every 6 (six) hours as needed for pain.    Marland Kitchen CAMILA 0.35 MG tablet Take 1 tablet by mouth daily.  3  . Cholecalciferol (VITAMIN D PO) Take by mouth.    . cyclobenzaprine (FLEXERIL) 10 MG tablet Take 1 tablet (10 mg total) by mouth every 8 (eight) hours as needed for muscle spasms. 60 tablet 1  . DULoxetine (CYMBALTA) 60 MG capsule TAKE 1 CAPSULE BY MOUTH EVERY DAY 30  capsule 2  . ibuprofen (ADVIL,MOTRIN) 200 MG tablet Take 600 mg by mouth every 6 (six) hours as needed for pain.    Marland Kitchen LINZESS 290 MCG CAPS capsule TAKE 1 CAPSULE BY MOUTH EVERY OTHER DAY 30 capsule 0  . LYRICA 75 MG capsule TAKE 1 CAPSULE BY MOUTH THREE TIMES A DAY 90 capsule 3  . oxyCODONE-acetaminophen (PERCOCET) 10-325 MG tablet Take 1 tablet by mouth every 8 (eight) hours as needed for pain (for migraines). 30 tablet 0  . phentermine 37.5 MG capsule Take 1 capsule (37.5 mg total) by mouth every morning. 30 capsule 2  . promethazine (PHENERGAN) 25 MG tablet TAKE 1 TABELT BY MOUTH EVERY 4 HOURS 20 tablet 3  . traZODone (DESYREL) 50 MG tablet TAKE 1 TABLET (50 MG TOTAL) BY MOUTH AT BEDTIME AS NEEDED FOR SLEEP. 30 tablet 0  . nitroGLYCERIN (NITROSTAT) 0.4 MG SL tablet Place 1 tablet (0.4 mg total) under the tongue every 5 (five) minutes as needed for chest pain. ([name] not taking: Reported on 08/27/2017) 20 tablet 3   No current facility-administered medications on file prior to visit.    Allergies  Allergen Reactions  . Prednisone Shortness Of Breath  . Ceftin [Cefuroxime Axetil] Rash  . Sulfa Antibiotics Rash   Social History   Social History  . Marital status: Married    Spouse name: N/A  . Number of children: N/A  .  Years of education: N/A   Occupational History  . Not on file.   Social History Main Topics  . Smoking status: Never Smoker  . Smokeless tobacco: Never Used  . Alcohol use No  . Drug use: No  . Sexual activity: Not Currently   Other Topics Concern  . Not on file   Social History Narrative  . No narrative on file      Review of Systems  All other systems reviewed and are negative.      Objective:   Physical Exam  Constitutional: She is oriented to person, place, and time. She appears well-developed and well-nourished.  Cardiovascular: Normal rate, regular rhythm and normal heart sounds.   Pulmonary/Chest: Effort normal and breath sounds normal.    Neurological: She is alert and oriented to person, place, and time. She has normal strength. No cranial nerve deficit or sensory deficit. She displays a negative Romberg sign.  Vitals reviewed.         Assessment & Plan:  Status migrainosus.  [name] was given 60 mg of Toradal IM 1 in addition to Phenergan 25 mg IM 1 to try to abort the headache. I will give [name] a Medrol Dosepak in addition to Fioricet 1 tablet by mouth every 6 hours when necessary headache. She can start this medication tomorrow if necessary if the Toradol and Phenergan did not abort the headache. Cautioned [name] not to mix oxycodone and Fioricet. I would like her to discontinue the oxycodone and replace it with Fioricet

## 2017-08-31 ENCOUNTER — Other Ambulatory Visit: Payer: Self-pay | Admitting: Family Medicine

## 2017-08-31 NOTE — Telephone Encounter (Signed)
Ok to refill 

## 2017-09-03 NOTE — Telephone Encounter (Signed)
ok 

## 2017-09-13 ENCOUNTER — Telehealth (HOSPITAL_COMMUNITY): Payer: Self-pay | Admitting: *Deleted

## 2017-09-13 ENCOUNTER — Ambulatory Visit: Payer: BC Managed Care – PPO | Admitting: Cardiology

## 2017-09-13 NOTE — Telephone Encounter (Signed)
Patient given detailed instructions per Stress Test Requisition Sheet for test on 09/19/17 at 2:30.Patient Notified to arrive 30 minutes early, and that it is imperative to arrive on time for appointment to keep from having the test rescheduled.  Patient verbalized understanding. Andrea [name]

## 2017-09-18 ENCOUNTER — Other Ambulatory Visit: Payer: Self-pay

## 2017-09-18 DIAGNOSIS — R079 Chest pain, unspecified: Secondary | ICD-10-CM

## 2017-09-19 ENCOUNTER — Other Ambulatory Visit (HOSPITAL_COMMUNITY): Payer: BC Managed Care – PPO

## 2017-09-20 ENCOUNTER — Telehealth: Payer: Self-pay

## 2017-09-20 NOTE — Telephone Encounter (Signed)
Pt called stating that she would like to can her GXT and her follow up appt with Dr. Geraldo Pitter due to having not met deductible and not able to afford it at this time. I did cx her appt with Dr. Geraldo Pitter and gave her the number to the [address] location to where she was scheduled to have the test done.

## 2017-09-24 ENCOUNTER — Ambulatory Visit: Payer: BC Managed Care – PPO | Admitting: Cardiology

## 2017-09-24 ENCOUNTER — Telehealth (HOSPITAL_COMMUNITY): Payer: Self-pay | Admitting: Cardiology

## 2017-09-24 NOTE — Telephone Encounter (Signed)
Patient (pt is calling to cancel-insurance isn't going to pay.)

## 2017-10-03 ENCOUNTER — Telehealth: Payer: Self-pay | Admitting: Family Medicine

## 2017-10-03 NOTE — Telephone Encounter (Signed)
Pt called and states that the Fioricet is not helping with her HA much either as she has had one since Saturday and wanted to know if she can take 3 Advil along with it or if you have any other suggestions for her. She also states that she usually does not have a period but has started this week as well and thinks that may be contributing to her HA.

## 2017-10-04 NOTE — Telephone Encounter (Signed)
Patient aware of providers recommendations via vm 

## 2017-10-04 NOTE — Telephone Encounter (Signed)
Ok with advil but I feel she needs to see neurology to discuss possible botox injections.

## 2017-10-08 ENCOUNTER — Ambulatory Visit: Payer: BC Managed Care – PPO | Admitting: Cardiology

## 2017-10-10 ENCOUNTER — Telehealth: Payer: Self-pay

## 2017-10-10 NOTE — Telephone Encounter (Signed)
Spoke with Pt and her husband pt has been having more chest pain that felt like someone squeezing and would like to know if there is something else that could be done being that her testing had been denied by insurance. I told pt that I would look into what next steps needed to be done for further evaluation. I also advised her that she should go pick up her Nitro and go the nearest hospital if chest pain happens to worsen.

## 2017-10-12 ENCOUNTER — Other Ambulatory Visit: Payer: Self-pay

## 2017-10-12 DIAGNOSIS — R079 Chest pain, unspecified: Secondary | ICD-10-CM

## 2017-10-17 ENCOUNTER — Encounter: Payer: Self-pay | Admitting: Cardiology

## 2017-10-18 ENCOUNTER — Other Ambulatory Visit: Payer: Self-pay | Admitting: Family Medicine

## 2017-10-18 MED ORDER — DULOXETINE HCL 60 MG PO CPEP: ORAL_CAPSULE | ORAL | 3 refills | Status: DC

## 2017-10-29 ENCOUNTER — Other Ambulatory Visit: Payer: Self-pay | Admitting: Family Medicine

## 2017-10-30 ENCOUNTER — Ambulatory Visit (HOSPITAL_COMMUNITY): Payer: BC Managed Care – PPO

## 2017-10-30 ENCOUNTER — Ambulatory Visit (HOSPITAL_COMMUNITY)
Admission: RE | Admit: 2017-10-30 | Discharge: 2017-10-30 | Disposition: A | Discharge status: Home / Self Care | Payer: BC Managed Care – PPO | Source: Ambulatory Visit | Attending: Cardiology | Admitting: Cardiology

## 2017-10-30 DIAGNOSIS — R079 Chest pain, unspecified: Secondary | ICD-10-CM

## 2017-10-30 MED ORDER — METOPROLOL TARTRATE 5 MG/5ML IV SOLN
5.0000 mg | INTRAVENOUS | Status: DC | PRN
  Administered 2017-10-30 (×4): 5 mg via INTRAVENOUS
  Filled 2017-10-30: qty 5

## 2017-10-30 MED ORDER — IOPAMIDOL (ISOVUE-370) INJECTION 76%
INTRAVENOUS | Status: AC
  Administered 2017-10-30: 80 mL
  Filled 2017-10-30: qty 100

## 2017-10-30 MED ORDER — METOPROLOL TARTRATE 5 MG/5ML IV SOLN
INTRAVENOUS | Status: AC
  Administered 2017-10-30: 5 mg via INTRAVENOUS
  Filled 2017-10-30: qty 5

## 2017-10-30 MED ORDER — NITROGLYCERIN 0.4 MG SL SUBL
SUBLINGUAL_TABLET | SUBLINGUAL | Status: AC
  Administered 2017-10-30: 0.8 mg via SUBLINGUAL
  Filled 2017-10-30: qty 2

## 2017-10-30 MED ORDER — NITROGLYCERIN 0.4 MG SL SUBL
0.8000 mg | SUBLINGUAL_TABLET | Freq: Once | SUBLINGUAL | Status: AC
  Administered 2017-10-30: 0.8 mg via SUBLINGUAL
  Filled 2017-10-30: qty 25

## 2017-10-30 MED ORDER — METOPROLOL TARTRATE 5 MG/5ML IV SOLN
INTRAVENOUS | Status: AC
  Administered 2017-10-30: 5 mg via INTRAVENOUS
  Filled 2017-10-30: qty 15

## 2017-10-30 NOTE — Telephone Encounter (Signed)
Medication called to pharmacy. 

## 2017-10-30 NOTE — Telephone Encounter (Signed)
Ok to refill 

## 2017-10-30 NOTE — Telephone Encounter (Signed)
ok 

## 2017-11-07 ENCOUNTER — Telehealth: Payer: Self-pay | Admitting: Family Medicine

## 2017-11-07 NOTE — Telephone Encounter (Signed)
[facility] has called to report that pt came today to pick up her Xanax refill.  The pharmacy records show it was picked up on Sunday 11/04/17 at the drive thru window.  [facility] policy is that the for control substances class 3-5. Customer must verify patient's name, DOB and address. Person picking up medication easily did that.  Another pharmacist who works there knows this patient and showed dispensing pharmacist a photo of pt and he said that person picking up Rx  looked like her.  Person also used a credit card to pay.  [facility] has call into cooperate office to get information on credit card used.  Pharmacist filling RX said there was nothing suspicious about this pick up.  They are wanting to know if Dr Dennard Schaumann will agree to refill?  Told not until end of investigation and then he will decide.  Pharmacy also made aware he is not in office this week.

## 2017-11-07 NOTE — Telephone Encounter (Signed)
Spoke to [place] at [facility] and they had gotten the pt to call with the las 4 of her credit/debit cards and they did not match what was given to them on Sunday. This is under investigation with the police. She wanted to know if we could refill her xanax. I spoke to MBD about the situation and informed her that the pt has never done anything unusual or abusive in the past. She agreed to refill once. Pharm was called and informed ok to fill. Called and spoke to pt and she is really upset over this as she fears now someone has her medication and all her information. She feels that the pharm give her medication to the wrong person by accident and hopes they will return it. I spoke to pt about having any suicidal ideation and she assured me she did not she just wanted a good nights sleep.

## 2017-11-11 NOTE — Telephone Encounter (Signed)
She has not done anything like this in the past, I will continue to refill her medication.  I do feel this is a criminal issue of someone getting her meds and new that they were due to be picked up.

## 2017-12-19 ENCOUNTER — Other Ambulatory Visit: Payer: Self-pay | Admitting: Family Medicine

## 2017-12-24 ENCOUNTER — Encounter: Payer: Self-pay | Admitting: Family Medicine

## 2017-12-24 ENCOUNTER — Ambulatory Visit: Payer: BC Managed Care – PPO | Admitting: Family Medicine

## 2017-12-24 VITALS — BP 120/70 | HR 110 | Temp 100.4°F | Resp 18 | Ht 60.0 in | Wt 182.0 lb

## 2017-12-24 DIAGNOSIS — R509 Fever, unspecified: Secondary | ICD-10-CM

## 2017-12-24 LAB — INFLUENZA A AND B AG, IMMUNOASSAY
INFLUENZA A ANTIGEN: NOT DETECTED
INFLUENZA B ANTIGEN: NOT DETECTED

## 2017-12-24 NOTE — Progress Notes (Signed)
Subjective:    Andrea [name] ID: Andrea Andrea [name], female    DOB: [birth year], 47 y.o.   MRN: [id number]  HPI Andrea [name] developed flulike symptoms last week. Symptoms include myalgias, sore throat, nonproductive cough, low-grade fever, head congestion. She continues to have a low-grade fever. She continues to have a nonproductive cough. The sore throat has improved. She continues to have fatigue and body aches. She denies any nausea vomiting or diarrhea Past Medical History:  Diagnosis Date  . Angiolipoma of kidney   . Arthritis    "hands" (09/02/2013)  . B12 deficiency   . Epicondylitis, lateral    Right  . GERD (gastroesophageal reflux disease)   . IBS (irritable bowel syndrome)   . Migraine headache    "maybe 6-8 times/yr" (09/02/2013)  . Shortness of breath    "all the time when I came in today" (09/02/2013)   Past Surgical History:  Procedure Laterality Date  . APPENDECTOMY  1984  . PALATAL EXPANSION  2004  . REDUCTION MAMMAPLASTY Bilateral ~ 2009   Current Outpatient Medications on File Prior to Visit  Medication Sig Dispense Refill  . Albuterol Sulfate (PROAIR RESPICLICK) 937 (90 Base) MCG/ACT AEPB Inhale 2 puffs into the lungs every 4 (four) hours. 1 each 3  . ALPRAZolam (XANAX) 0.5 MG tablet TAKE 1 TABLET AT BEDTIME AS NEEDED 30 tablet 2  . aspirin-acetaminophen-caffeine (EXCEDRIN MIGRAINE) 169-678-93 MG per tablet Take 2 tablets by mouth every 6 (six) hours as needed for pain.    . baclofen (LIORESAL) 10 MG tablet     . butalbital-acetaminophen-caffeine (FIORICET/CODEINE) 50-325-40-30 MG capsule Take 1 capsule by mouth every 4 (four) hours as needed for headache (stop oxycodone). 30 capsule 0  . CAMILA 0.35 MG tablet Take 1 tablet by mouth daily.  3  . Cholecalciferol (VITAMIN D PO) Take by mouth.    . cyclobenzaprine (FLEXERIL) 10 MG tablet Take 1 tablet (10 mg total) by mouth every 8 (eight) hours as needed for muscle spasms. 60 tablet 1  . DULoxetine (CYMBALTA) 60 MG capsule  TAKE 1 CAPSULE BY MOUTH EVERY DAY 90 capsule 3  . ibuprofen (ADVIL,MOTRIN) 200 MG tablet Take 600 mg by mouth every 6 (six) hours as needed for pain.    Marland Kitchen LINZESS 290 MCG CAPS capsule TAKE 1 CAPSULE BY MOUTH EVERY OTHER DAY 30 capsule 0  . nitroGLYCERIN (NITROSTAT) 0.4 MG SL tablet Place 1 tablet (0.4 mg total) under the tongue every 5 (five) minutes as needed for chest pain. 20 tablet 3  . oxyCODONE-acetaminophen (PERCOCET) 10-325 MG tablet Take 1 tablet by mouth every 8 (eight) hours as needed for pain (for migraines). 30 tablet 0  . promethazine (PHENERGAN) 25 MG tablet TAKE 1 TABELT BY MOUTH EVERY 4 HOURS 20 tablet 3  . traZODone (DESYREL) 50 MG tablet TAKE 1 TABLET (50 MG TOTAL) BY MOUTH AT BEDTIME AS NEEDED FOR SLEEP. 30 tablet 0  . zonisamide (ZONEGRAN) 25 MG capsule Take 75 mg by mouth at bedtime.      No current facility-administered medications on file prior to visit.    Allergies  Allergen Reactions  . Prednisone Shortness Of Breath  . Ceftin [Cefuroxime Axetil] Rash  . Sulfa Antibiotics Rash   Social History   Socioeconomic History  . Marital status: Married    Spouse name: Not on file  . Number of children: Not on file  . Years of education: Not on file  . Highest education level: Not on file  Social Needs  .  Financial resource strain: Not on file  . Food insecurity - worry: Not on file  . Food insecurity - inability: Not on file  . Transportation needs - medical: Not on file  . Transportation needs - non-medical: Not on file  Occupational History  . Not on file  Tobacco Use  . Smoking status: Never Smoker  . Smokeless tobacco: Never Used  Substance and Sexual Activity  . Alcohol use: No  . Drug use: No  . Sexual activity: Not Currently  Other Topics Concern  . Not on file  Social History Narrative  . Not on file      Review of Systems  All other systems reviewed and are negative.      Objective:   Physical Exam  Constitutional: She appears  well-developed and well-nourished. No distress.  HENT:  Right Ear: External ear normal.  Left Ear: External ear normal.  Nose: Nose normal.  Mouth/Throat: Oropharynx is clear and moist. No oropharyngeal exudate.  Eyes: Conjunctivae are normal.  Neck: Neck supple.  Cardiovascular: Normal rate, regular rhythm and normal heart sounds.  Pulmonary/Chest: Effort normal and breath sounds normal. No respiratory distress. She has no wheezes. She has no rales.  Abdominal: Soft. Bowel sounds are normal. She exhibits no distension. There is no tenderness. There is no rebound and no guarding.  Lymphadenopathy:    She has no cervical adenopathy.  Skin: No rash noted. She is not diaphoretic.  Vitals reviewed.         Assessment & Plan:  Fever, unspecified fever cause - Plan: Influenza A and B Ag, Immunoassay  Andrea [name] appears to have the flu or a flulike illness. Andrea [name] seems to be having a mild course. Therefore after discussion, we decided against Tamiflu. Instead recommended tincture of time and symptoms control including ibuprofen and/or Tylenol as needed for body aches and fever, Mucinex DM as needed for cough and congestion, Sudafed as needed for congestion, rest, and pushing fluids.

## 2018-01-09 ENCOUNTER — Other Ambulatory Visit: Payer: Self-pay | Admitting: Family Medicine

## 2018-01-09 NOTE — Telephone Encounter (Signed)
Pt requesting refill on oxycodone sent to [address] [facility].

## 2018-01-10 MED ORDER — OXYCODONE-ACETAMINOPHEN 10-325 MG PO TABS: 1.0000 | ORAL_TABLET | Freq: Three times a day (TID) | ORAL | 0 refills | Status: DC | PRN

## 2018-01-10 NOTE — Telephone Encounter (Signed)
Patient requesting a refill on Oxycodone     LOV: 12/24/17  LRF:     07/05/17

## 2018-01-16 ENCOUNTER — Other Ambulatory Visit: Payer: Self-pay | Admitting: Family Medicine

## 2018-01-22 ENCOUNTER — Encounter: Payer: Self-pay | Admitting: Family Medicine

## 2018-01-22 ENCOUNTER — Ambulatory Visit
Admission: RE | Admit: 2018-01-22 | Discharge: 2018-01-22 | Disposition: A | Discharge status: Home / Self Care | Payer: BC Managed Care – PPO | Source: Ambulatory Visit | Attending: Family Medicine | Admitting: Family Medicine

## 2018-01-22 ENCOUNTER — Ambulatory Visit: Payer: BC Managed Care – PPO | Admitting: Family Medicine

## 2018-01-22 VITALS — BP 110/64 | HR 84 | Temp 98.2°F | Resp 16 | Ht 60.0 in | Wt 180.0 lb

## 2018-01-22 DIAGNOSIS — J069 Acute upper respiratory infection, unspecified: Secondary | ICD-10-CM

## 2018-01-22 MED ORDER — HYDROCODONE-HOMATROPINE 5-1.5 MG/5ML PO SYRP: 5.0000 mL | ORAL_SOLUTION | Freq: Three times a day (TID) | ORAL | 0 refills | Status: DC | PRN

## 2018-01-22 NOTE — Progress Notes (Signed)
Subjective:    Andrea [name] ID: Andrea Andrea [name], female    DOB: 16-Dec-1970, 47 y.o.   MRN: [id number]  HPI  Andrea [name] was seen in January for a flu like virus.  Andrea [name] works as a Pharmacist, hospital.  She is not sure that that Andrea Andrea [name] ever truly recovered or if she may have acquired another infection.  She states that she is been coughing for the last several weeks.  Cough is productive of clear yellow sputum.  She denies any shortness of breath.  She does report some posterior pleurisy in the muscles in her lower back secondary to coughing.  She is occasionally wheezing.  She reports subjective fevers although she is afebrile this morning.  She denies any sinus pain.  She denies any otalgia.  She denies any sore throat.  She does report head congestion and rhinorrhea and she sounds very congested this morning Past Medical History:  Diagnosis Date  . Angiolipoma of kidney   . Arthritis    "hands" (09/02/2013)  . B12 deficiency   . Epicondylitis, lateral    Right  . GERD (gastroesophageal reflux disease)   . IBS (irritable bowel syndrome)   . Migraine headache    "maybe 6-8 times/yr" (09/02/2013)  . Shortness of breath    "all the time when I came in today" (09/02/2013)   Past Surgical History:  Procedure Laterality Date  . APPENDECTOMY  1984  . PALATAL EXPANSION  2004  . REDUCTION MAMMAPLASTY Bilateral ~ 2009   Current Outpatient Medications on File Prior to Visit  Medication Sig Dispense Refill  . Albuterol Sulfate (PROAIR RESPICLICK) 094 (90 Base) MCG/ACT AEPB Inhale 2 puffs into the lungs every 4 (four) hours. 1 each 3  . ALPRAZolam (XANAX) 0.5 MG tablet TAKE 1 TABLET AT BEDTIME AS NEEDED 30 tablet 2  . aspirin-acetaminophen-caffeine (EXCEDRIN MIGRAINE) 709-628-36 MG per tablet Take 2 tablets by mouth every 6 (six) hours as needed for pain.    . baclofen (LIORESAL) 10 MG tablet     . butalbital-acetaminophen-caffeine (FIORICET/CODEINE) 50-325-40-30 MG capsule Take 1 capsule by mouth every 4 (four)  hours as needed for headache (stop oxycodone). 30 capsule 0  . CAMILA 0.35 MG tablet Take 1 tablet by mouth daily.  3  . Cholecalciferol (VITAMIN D PO) Take by mouth.    . cyclobenzaprine (FLEXERIL) 10 MG tablet Take 1 tablet (10 mg total) by mouth every 8 (eight) hours as needed for muscle spasms. 60 tablet 1  . DULoxetine (CYMBALTA) 60 MG capsule TAKE 1 CAPSULE BY MOUTH EVERY DAY 90 capsule 0  . ibuprofen (ADVIL,MOTRIN) 200 MG tablet Take 600 mg by mouth every 6 (six) hours as needed for pain.    Marland Kitchen LINZESS 290 MCG CAPS capsule TAKE 1 CAPSULE BY MOUTH EVERY OTHER DAY 30 capsule 0  . nitroGLYCERIN (NITROSTAT) 0.4 MG SL tablet Place 1 tablet (0.4 mg total) under the tongue every 5 (five) minutes as needed for chest pain. 20 tablet 3  . oxyCODONE-acetaminophen (PERCOCET) 10-325 MG tablet Take 1 tablet by mouth every 8 (eight) hours as needed for pain (for migraines). 30 tablet 0  . promethazine (PHENERGAN) 25 MG tablet TAKE 1 TABELT BY MOUTH EVERY 4 HOURS 20 tablet 3  . traZODone (DESYREL) 50 MG tablet TAKE 1 TABLET (50 MG TOTAL) BY MOUTH AT BEDTIME AS NEEDED FOR SLEEP. 30 tablet 0  . zonisamide (ZONEGRAN) 25 MG capsule Take 75 mg by mouth at bedtime.      No current facility-administered medications on  file prior to visit.    Allergies  Allergen Reactions  . Prednisone Shortness Of Breath  . Ceftin [Cefuroxime Axetil] Rash  . Sulfa Antibiotics Rash   Social History   Socioeconomic History  . Marital status: Married    Spouse name: Not on file  . Number of children: Not on file  . Years of education: Not on file  . Highest education level: Not on file  Social Needs  . Financial resource strain: Not on file  . Food insecurity - worry: Not on file  . Food insecurity - inability: Not on file  . Transportation needs - medical: Not on file  . Transportation needs - non-medical: Not on file  Occupational History  . Not on file  Tobacco Use  . Smoking status: Never Smoker  . Smokeless  tobacco: Never Used  Substance and Sexual Activity  . Alcohol use: No  . Drug use: No  . Sexual activity: Not Currently  Other Topics Concern  . Not on file  Social History Narrative  . Not on file      Review of Systems  All other systems reviewed and are negative.      Objective:   Physical Exam  Constitutional: She appears well-developed and well-nourished. No distress.  HENT:  Right Ear: External ear normal.  Left Ear: External ear normal.  Nose: Nose normal.  Mouth/Throat: Oropharynx is clear and moist. No oropharyngeal exudate.  Eyes: Conjunctivae are normal.  Neck: Neck supple.  Cardiovascular: Normal rate, regular rhythm and normal heart sounds.  Pulmonary/Chest: Effort normal and breath sounds normal. No respiratory distress. She has no wheezes. She has no rales.  Abdominal: Soft. Bowel sounds are normal. She exhibits no distension. There is no tenderness. There is no rebound and no guarding.  Lymphadenopathy:    She has no cervical adenopathy.  Skin: No rash noted. She is not diaphoretic.  Vitals reviewed.         Assessment & Plan:  Viral upper respiratory tract infection Andrea [name]'s exam is significant for rhinorrhea and congestion but is otherwise normal.  I believe she is likely acquired a second viral infection directly after the first causing her to seem like she has been sick for over a month.  However her exam is completely normal and she denies any worrisome signs.  I would proceed with a chest x-ray given the length of time she has been coughing.  If the chest x-ray is clear I would recommend symptomatic treatment for viral upper respiratory infection. Recommended tincture of time and symptoms control including ibuprofen and/or Tylenol as needed for body aches and fever, Mucinex DM as needed for cough and congestion, Sudafed as needed for congestion, rest, and pushing fluids.  I would anticipate symptoms to improve in 1 week.  I would give the Andrea [name]  Hycodan 1 teaspoon every 6 hours as needed for severe cough.  Obviously if the chest x-ray shows any infiltrate or bronchitis, I would add an antibiotic to cover that as well.  However my clinical impression is that the Andrea [name] has a cold.  Call back if no better in 1 week or sooner if worsening

## 2018-01-25 ENCOUNTER — Other Ambulatory Visit: Payer: Self-pay | Admitting: Family Medicine

## 2018-01-25 ENCOUNTER — Telehealth: Payer: Self-pay | Admitting: Family Medicine

## 2018-01-25 MED ORDER — AMOXICILLIN-POT CLAVULANATE 875-125 MG PO TABS: 1.0000 | ORAL_TABLET | Freq: Two times a day (BID) | ORAL | 0 refills | Status: DC

## 2018-01-25 NOTE — Telephone Encounter (Signed)
Pt called and states that her cough is better but she thinks she has developed a sinus infection and would like something called in if possible. She is having green and bloody drainage and a lot of facial/teeth pain.

## 2018-01-25 NOTE — Telephone Encounter (Signed)
Ok, I will escribe.

## 2018-01-25 NOTE — Telephone Encounter (Signed)
Pt aware via vm 

## 2018-02-10 ENCOUNTER — Other Ambulatory Visit: Payer: Self-pay | Admitting: Family Medicine

## 2018-02-11 NOTE — Telephone Encounter (Signed)
Requesting refill    Flexeril  LOV: 01/22/18  LRF:  01/15/18

## 2018-02-18 ENCOUNTER — Other Ambulatory Visit: Payer: Self-pay | Admitting: Family Medicine

## 2018-02-20 ENCOUNTER — Other Ambulatory Visit: Payer: Self-pay | Admitting: Family Medicine

## 2018-03-25 ENCOUNTER — Ambulatory Visit: Payer: BC Managed Care – PPO | Admitting: Family Medicine

## 2018-03-25 ENCOUNTER — Encounter: Payer: Self-pay | Admitting: Family Medicine

## 2018-03-25 VITALS — BP 110/76 | HR 72 | Temp 98.0°F | Resp 16 | Ht 60.0 in | Wt 183.0 lb

## 2018-03-25 DIAGNOSIS — L249 Irritant contact dermatitis, unspecified cause: Secondary | ICD-10-CM | POA: Diagnosis not present

## 2018-03-25 DIAGNOSIS — M6530 Trigger finger, unspecified finger: Secondary | ICD-10-CM

## 2018-03-25 MED ORDER — MOMETASONE FUROATE 0.1 % EX CREA: 1.0000 "application " | TOPICAL_CREAM | Freq: Every day | CUTANEOUS | 0 refills | Status: DC

## 2018-03-25 NOTE — Progress Notes (Signed)
Subjective:    [name] ID: Andrea [name], female    DOB: 1971/09/18, 47 y.o.   MRN: [id number]  HPI 05/2017 [name] is here today to discuss weight loss. She is not engaging in any regular aerobic exercise. She is not following any specific diet but she is trying to make better food choices whenever possible. Her BMI today is greater than 36. Her weight also complicates her fibromyalgia because the weight increases her joint pains. She is concerned because now she's having a difficult time even getting out of the bathtub. She feels that she can lose weight, she will be able to move better and with less pain. She is motivated to make lifestyle changes to try to address this. She also has a trigger finger in her right hand and the third joint. The third finger will lock on occasion when flexed. She is requesting an injection for trigger finger.  At that time, my plan was: We discussed weight loss strategies including less than 1200 cal a day, Weight Watchers, and 30 minutes to an hour a day of aerobic exercise. She would also like to try phentermine 37.5 mg poqam for 90 days.  Using sterile technique, I injected the right third digit at the palmar side of the flexor crease of the MCP joint. I injected adjacent to the flexor tendon but not directly into the flexor tendon.  03/25/17 Cortisone injection helped for several months.  Triggering stopped.  However, it has started again and [name] reports pain and stiffness at the palmer surface of the right 3rd mcp joint.  She also has a patch of erythematous skin in the antecubital fossa of the left arm.  Patch is eliptical and 2 x 4 cm in size.  Very itchy with atopic features.  Has two erythematous papules on mid left biceps in a horizontal line and three papules on superior biceps in a horizontal line.  All are 4-5 mm in size and itchy.   Past Surgical History:  Procedure Laterality Date  . APPENDECTOMY  1984  . PALATAL EXPANSION  2004  . REDUCTION  MAMMAPLASTY Bilateral ~ 2009   Current Outpatient Medications on File Prior to Visit  Medication Sig Dispense Refill  . Albuterol Sulfate (PROAIR RESPICLICK) 008 (90 Base) MCG/ACT AEPB Inhale 2 puffs into the lungs every 4 (four) hours. 1 each 3  . ALPRAZolam (XANAX) 0.5 MG tablet TAKE 1 TABLET AT BEDTIME AS NEEDED 30 tablet 2  . aspirin-acetaminophen-caffeine (EXCEDRIN MIGRAINE) 676-195-09 MG per tablet Take 2 tablets by mouth every 6 (six) hours as needed for pain.    . baclofen (LIORESAL) 10 MG tablet     . butalbital-acetaminophen-caffeine (FIORICET/CODEINE) 50-325-40-30 MG capsule Take 1 capsule by mouth every 4 (four) hours as needed for headache (stop oxycodone). 30 capsule 0  . CAMILA 0.35 MG tablet Take 1 tablet by mouth daily.  3  . Cholecalciferol (VITAMIN D PO) Take by mouth.    . cyclobenzaprine (FLEXERIL) 10 MG tablet TAKE 1 TABLET BY MOUTH EVERY 8 HOURS AS NEEDED FOR MUSCLE SPASMS. 60 tablet 1  . DULoxetine (CYMBALTA) 60 MG capsule TAKE 1 CAPSULE BY MOUTH EVERY DAY 90 capsule 0  . ibuprofen (ADVIL,MOTRIN) 200 MG tablet Take 600 mg by mouth every 6 (six) hours as needed for pain.    Marland Kitchen LINZESS 290 MCG CAPS capsule TAKE 1 CAPSULE BY MOUTH EVERY OTHER DAY 30 capsule 0  . nitroGLYCERIN (NITROSTAT) 0.4 MG SL tablet Place 1 tablet (0.4 mg total)  under the tongue every 5 (five) minutes as needed for chest pain. 20 tablet 3  . oxyCODONE-acetaminophen (PERCOCET) 10-325 MG tablet Take 1 tablet by mouth every 8 (eight) hours as needed for pain (for migraines). 30 tablet 0  . promethazine (PHENERGAN) 25 MG tablet TAKE 1 TABELT BY MOUTH EVERY 4 HOURS 20 tablet 3  . traZODone (DESYREL) 50 MG tablet TAKE 1 TABLET (50 MG TOTAL) BY MOUTH AT BEDTIME AS NEEDED FOR SLEEP. 30 tablet 0  . zonisamide (ZONEGRAN) 50 MG capsule TAKE 1 CAPSULE BY MOUTH THREE TIMES A DAY  1   No current facility-administered medications on file prior to visit.    Allergies  Allergen Reactions  . Prednisone Shortness Of  Breath  . Ceftin [Cefuroxime Axetil] Rash  . Sulfa Antibiotics Rash   Social History   Socioeconomic History  . Marital status: Married    Spouse name: Not on file  . Number of children: Not on file  . Years of education: Not on file  . Highest education level: Not on file  Occupational History  . Not on file  Social Needs  . Financial resource strain: Not on file  . Food insecurity:    Worry: Not on file    Inability: Not on file  . Transportation needs:    Medical: Not on file    Non-medical: Not on file  Tobacco Use  . Smoking status: Never Smoker  . Smokeless tobacco: Never Used  Substance and Sexual Activity  . Alcohol use: No  . Drug use: No  . Sexual activity: Not Currently  Lifestyle  . Physical activity:    Days per week: Not on file    Minutes per session: Not on file  . Stress: Not on file  Relationships  . Social connections:    Talks on phone: Not on file    Gets together: Not on file    Attends religious service: Not on file    Active member of club or organization: Not on file    Attends meetings of clubs or organizations: Not on file    Relationship status: Not on file  . Intimate partner violence:    Fear of current or ex partner: Not on file    Emotionally abused: Not on file    Physically abused: Not on file    Forced sexual activity: Not on file  Other Topics Concern  . Not on file  Social History Narrative  . Not on file     Review of Systems  All other systems reviewed and are negative.      Objective:   Physical Exam  Cardiovascular: Normal rate, regular rhythm and normal heart sounds.  Pulmonary/Chest: Effort normal and breath sounds normal.  Musculoskeletal:       Arms:      Right hand: She exhibits normal range of motion, no tenderness, no bony tenderness and no deformity. Normal sensation noted. Normal strength noted.       Hands: Vitals reviewed.         Assessment & Plan:  Trigger finger of right hand, unspecified  finger - Plan: Ambulatory referral to Hand Surgery  Irritant contact dermatitis, unspecified trigger  Recurrent trigger finger.  I will consult orthopedics given recurrence for long term management.  Meanwhile suspect contact dermatitis.  Use elocon cream bid for 1 week.

## 2018-03-27 ENCOUNTER — Other Ambulatory Visit: Payer: Self-pay | Admitting: Family Medicine

## 2018-03-28 NOTE — Telephone Encounter (Signed)
Ok to refill??  Last office visit 03/25/2018.  Last refill 10/30/2017, #2 refills.

## 2018-04-04 ENCOUNTER — Other Ambulatory Visit: Payer: Self-pay | Admitting: Family Medicine

## 2018-04-04 MED ORDER — OXYCODONE-ACETAMINOPHEN 10-325 MG PO TABS: 1.0000 | ORAL_TABLET | Freq: Three times a day (TID) | ORAL | 0 refills | Status: DC | PRN

## 2018-04-04 NOTE — Telephone Encounter (Signed)
Patient requesting a refill on Oxycodone     LOV: 03/25/18  LRF:    01/10/18

## 2018-05-24 ENCOUNTER — Other Ambulatory Visit: Payer: Self-pay | Admitting: Family Medicine

## 2018-06-05 ENCOUNTER — Ambulatory Visit: Payer: BC Managed Care – PPO | Admitting: Family Medicine

## 2018-06-05 ENCOUNTER — Encounter: Payer: Self-pay | Admitting: Family Medicine

## 2018-06-05 VITALS — BP 100/62 | HR 104 | Temp 99.0°F | Resp 16 | Ht 60.0 in | Wt 186.0 lb

## 2018-06-05 DIAGNOSIS — R300 Dysuria: Secondary | ICD-10-CM

## 2018-06-05 DIAGNOSIS — R109 Unspecified abdominal pain: Secondary | ICD-10-CM

## 2018-06-05 LAB — CBC WITH DIFFERENTIAL/PLATELET
BASOS PCT: 0.7 %
Basophils Absolute: 69 cells/uL (ref 0–200)
EOS PCT: 1 %
Eosinophils Absolute: 99 cells/uL (ref 15–500)
HCT: 41.6 % (ref 35.0–45.0)
HEMOGLOBIN: 13.5 g/dL (ref 11.7–15.5)
Lymphs Abs: 1634 cells/uL (ref 850–3900)
MCH: 26.2 pg — AB (ref 27.0–33.0)
MCHC: 32.5 g/dL (ref 32.0–36.0)
MCV: 80.8 fL (ref 80.0–100.0)
MONOS PCT: 6.2 %
MPV: 11 fL (ref 7.5–12.5)
NEUTROS ABS: 7484 {cells}/uL (ref 1500–7800)
Neutrophils Relative %: 75.6 %
PLATELETS: 286 10*3/uL (ref 140–400)
RBC: 5.15 10*6/uL — AB (ref 3.80–5.10)
RDW: 12.6 % (ref 11.0–15.0)
TOTAL LYMPHOCYTE: 16.5 %
WBC mixed population: 614 cells/uL (ref 200–950)
WBC: 9.9 10*3/uL (ref 3.8–10.8)

## 2018-06-05 LAB — COMPLETE METABOLIC PANEL WITH GFR
AG Ratio: 1.9 (calc) (ref 1.0–2.5)
ALBUMIN MSPROF: 4.5 g/dL (ref 3.6–5.1)
ALT: 21 U/L (ref 6–29)
AST: 20 U/L (ref 10–35)
Alkaline phosphatase (APISO): 84 U/L (ref 33–115)
BILIRUBIN TOTAL: 0.4 mg/dL (ref 0.2–1.2)
BUN: 16 mg/dL (ref 7–25)
CALCIUM: 9.4 mg/dL (ref 8.6–10.2)
CHLORIDE: 104 mmol/L (ref 98–110)
CO2: 26 mmol/L (ref 20–32)
CREATININE: 0.78 mg/dL (ref 0.50–1.10)
GFR, EST AFRICAN AMERICAN: 105 mL/min/{1.73_m2} (ref >=60)
GFR, Est Non African American: 91 mL/min/{1.73_m2} (ref >=60)
GLUCOSE: 83 mg/dL (ref 65–99)
Globulin: 2.4 g/dL (calc) (ref 1.9–3.7)
Potassium: 4.1 mmol/L (ref 3.5–5.3)
Sodium: 139 mmol/L (ref 135–146)
Total Protein: 6.9 g/dL (ref 6.1–8.1)

## 2018-06-05 LAB — URINALYSIS, ROUTINE W REFLEX MICROSCOPIC
Bilirubin Urine: NEGATIVE
Glucose, UA: NEGATIVE
HGB URINE DIPSTICK: NEGATIVE
KETONES UR: NEGATIVE
LEUKOCYTES UA: NEGATIVE
NITRITE: NEGATIVE
PH: 7 (ref 5.0–8.0)
PROTEIN: NEGATIVE
Specific Gravity, Urine: 1.01 (ref 1.001–1.03)

## 2018-06-05 LAB — PREGNANCY, URINE: Preg Test, Ur: NEGATIVE

## 2018-06-05 MED ORDER — OXYCODONE-ACETAMINOPHEN 5-325 MG PO TABS: 1.0000 | ORAL_TABLET | Freq: Three times a day (TID) | ORAL | 0 refills | Status: AC | PRN

## 2018-06-05 MED ORDER — PROMETHAZINE HCL 25 MG PO TABS: 25.0000 mg | ORAL_TABLET | Freq: Three times a day (TID) | ORAL | 0 refills | Status: DC | PRN

## 2018-06-05 MED ORDER — NITROFURANTOIN MONOHYD MACRO 100 MG PO CAPS: 100.0000 mg | ORAL_CAPSULE | Freq: Two times a day (BID) | ORAL | 0 refills | Status: DC

## 2018-06-05 NOTE — Progress Notes (Signed)
[name] ID: Andrea [name], female    DOB: 1971/08/21, 47 y.o.   MRN: [id number]  PCP: Susy Frizzle, MD  Chief Complaint  [name] presents with  . LBP, side and groin area pain - ?UTI vs Kidney stones    Subjective:   Andrea [name] is a 47 y.o. female, presents to clinic with CC of 4 days of back pain, flank pain but has progressed to lower abdominal pain with associated cloudy and malodorous urine.  When the symptoms first began she describes severe to onset of bilateral mid back pain and flank pain, achy and cramping, constant severe pain with intermittent waves of intensity that last a few minutes at a time.  Pain is associated with nausea and sweats.  She did use her home Phenergan and Percocets which gave temporary relief and prevented vomiting.  States that the pain moves to different locations and over 4 days has moved from primarily being located in her back to wrapping around her hips and now located also to bilateral lower quadrants and suprapubic area.  Pain continues to be constant but is less severe, today rated 6 out of 10, without any associated nausea or sweats.  Her urine continues to be cloudy and malodorous.  She denies any bowel changes, no diarrhea, melena or hematochezia.  Mildly decreased appetite with nausea, but no change to pain with eating or positions.  No sick contacts.  Vaginal symptoms.  She reports a history of UTIs but the most recent one was at least 2 or 3 years ago.  She is concerned for kidney infection, UTI or kidney stone.  She does show a picture of something solid that she passed in her urine, in the bottom of the commode it appears slightly gray and very small, less than half centimeter.   Denies any hematuria, chest pain, SOB, fever, body aches, vomiting. She has pertinent medical hx of IBS, unchanged and managed with linzess.  Hx of fibromyalgia.  She has pain medication and phenergan for her migraines, and she has used them for her sx, they  are temporarily effective.   Abdominal surgical hx + for appendectomy.    [name] Active Problem List   Diagnosis Date Noted  . Fibromyalgia 08/15/2017  . Chest pain with low risk for cardiac etiology 09/02/2013  . Family history of early CAD 09/02/2013  . B12 deficiency   . Epicondylitis, lateral      Prior to Admission medications   Medication Sig Start Date End Date Taking? Authorizing Provider  Albuterol Sulfate (PROAIR RESPICLICK) 664 (90 Base) MCG/ACT AEPB Inhale 2 puffs into the lungs every 4 (four) hours. 08/10/17  Yes Susy Frizzle, MD  ALPRAZolam Duanne Moron) 0.5 MG tablet TAKE 1 TABLET BY MOUTH AT BEDTIME AS NEEDED FOR ANXIETY OR SLEEP 03/28/18  Yes Susy Frizzle, MD  aspirin-acetaminophen-caffeine (EXCEDRIN MIGRAINE) [phone] MG per tablet Take 2 tablets by mouth every 6 (six) hours as needed for pain.   Yes [provider]  baclofen (LIORESAL) 10 MG tablet  12/22/17  Yes [provider]  butalbital-acetaminophen-caffeine (FIORICET/CODEINE) 50-325-40-30 MG capsule Take 1 capsule by mouth every 4 (four) hours as needed for headache (stop oxycodone). 08/27/17  Yes Susy Frizzle, MD  CAMILA 0.35 MG tablet Take 1 tablet by mouth daily. 01/17/15  Yes [provider]  Cholecalciferol (VITAMIN D PO) Take by mouth.   Yes [provider]  cyclobenzaprine (FLEXERIL) 10 MG tablet TAKE 1 TABLET BY MOUTH EVERY 8  HOURS AS NEEDED FOR MUSCLE SPASMS. 02/11/18  Yes Susy Frizzle, MD  DULoxetine (CYMBALTA) 60 MG capsule TAKE 1 CAPSULE BY MOUTH EVERY DAY 05/24/18  Yes Susy Frizzle, MD  ibuprofen (ADVIL,MOTRIN) 200 MG tablet Take 600 mg by mouth every 6 (six) hours as needed for pain.   Yes [provider]  LINZESS 290 MCG CAPS capsule TAKE 1 CAPSULE BY MOUTH EVERY OTHER DAY 12/19/17  Yes Blackwood, Modena Nunnery, MD  mometasone (ELOCON) 0.1 % cream Apply 1 application topically daily. 03/25/18  Yes Susy Frizzle, MD  oxyCODONE-acetaminophen  (PERCOCET) 10-325 MG tablet Take 1 tablet by mouth every 8 (eight) hours as needed for pain (for migraines). 04/04/18  Yes Susy Frizzle, MD  promethazine (PHENERGAN) 25 MG tablet TAKE 1 TABELT BY MOUTH EVERY 4 HOURS 08/22/17  Yes Susy Frizzle, MD     Allergies  Allergen Reactions  . Prednisone Shortness Of Breath  . Ceftin [Cefuroxime Axetil] Rash  . Sulfa Antibiotics Rash     Family History  Problem Relation Age of Onset  . Coronary artery disease Father 103       MI  . Heart attack Father   . Irregular heart beat Sister   . Arrhythmia Paternal Uncle      Social History   Socioeconomic History  . Marital status: Married    Spouse name: Not on file  . Number of children: Not on file  . Years of education: Not on file  . Highest education level: Not on file  Occupational History  . Not on file  Social Needs  . Financial resource strain: Not on file  . Food insecurity:    Worry: Not on file    Inability: Not on file  . Transportation needs:    Medical: Not on file    Non-medical: Not on file  Tobacco Use  . Smoking status: Never Smoker  . Smokeless tobacco: Never Used  Substance and Sexual Activity  . Alcohol use: No  . Drug use: No  . Sexual activity: Not Currently  Lifestyle  . Physical activity:    Days per week: Not on file    Minutes per session: Not on file  . Stress: Not on file  Relationships  . Social connections:    Talks on phone: Not on file    Gets together: Not on file    Attends religious service: Not on file    Active member of club or organization: Not on file    Attends meetings of clubs or organizations: Not on file    Relationship status: Not on file  . Intimate partner violence:    Fear of current or ex partner: Not on file    Emotionally abused: Not on file    Physically abused: Not on file    Forced sexual activity: Not on file  Other Topics Concern  . Not on file  Social History Narrative  . Not on file     Review  of Systems  10 Systems reviewed and are negative for acute change except as noted in the HPI.     Objective:    Vitals:   06/05/18 1209  BP: 100/62  Pulse: (!) 104  Resp: 16  Temp: 99 F (37.2 C)  TempSrc: Oral  SpO2: 98%  Weight: 186 lb (84.4 kg)  Height: 5' (1.524 m)      Physical Exam  Constitutional: She is oriented to person, place, and time. She appears well-developed and  well-nourished.  Non-toxic appearance. No distress.  HENT:  Head: Normocephalic and atraumatic.  Right Ear: External ear normal.  Left Ear: External ear normal.  Nose: Nose normal.  Mouth/Throat: Uvula is midline, oropharynx is clear and moist and mucous membranes are normal.  Eyes: Pupils are equal, round, and reactive to light. Conjunctivae, EOM and lids are normal. No scleral icterus.  Neck: Normal range of motion and phonation normal. Neck supple. No tracheal deviation present.  Cardiovascular: Regular rhythm, normal heart sounds, intact distal pulses and normal pulses. Tachycardia present. Exam reveals no gallop and no friction rub.  No murmur heard. Pulses:      Radial pulses are 2+ on the right side, and 2+ on the left side.       Posterior tibial pulses are 2+ on the right side, and 2+ on the left side.  Pulmonary/Chest: Effort normal and breath sounds normal. No stridor. No respiratory distress. She has no wheezes. She has no rhonchi. She has no rales. She exhibits no tenderness.  Abdominal: Soft. Normal appearance and bowel sounds are normal. She exhibits no distension and no mass. There is tenderness. There is no rebound and no guarding. No hernia.  CVA tenderness b/l and diffuse abd ttp w/o guarding or rebound tenderness, no rigidity, abd soft, obese  Musculoskeletal: Normal range of motion. She exhibits no edema or deformity.  Lymphadenopathy:    She has no cervical adenopathy.  Neurological: She is alert and oriented to person, place, and time. She exhibits normal muscle tone. Gait  normal.  Skin: Skin is warm, dry and intact. Capillary refill takes less than 2 seconds. No rash noted. She is not diaphoretic. No pallor.  Psychiatric: She has a normal mood and affect. Her speech is normal and behavior is normal.  Nursing note and vitals reviewed.         Assessment & Plan:      ICD-10-CM   1. Dysuria R30.0 Urinalysis, Routine w reflex microscopic    Urine Culture    CBC with Differential    COMPLETE METABOLIC PANEL WITH GFR    US Renal    Pregnancy, urine  2. Abdominal pain, unspecified abdominal location R10.9 CBC with Differential    COMPLETE METABOLIC PANEL WITH GFR    US Renal    Pregnancy, urine  3. Flank pain R10.9 US Renal      DDx passed stone?  Pyelo?  UTI?  Pt is well appearing, but give hx of colicky abdominal and flank pain with generalized tenderness on exam, although no concern for surgical abd, nothing focal, no peritoneal signs. UA with normal dip, culture pending Discussed renal stone study/CT, but pt prefers to do renal US, which she understands will not be able to pick up on other causes of her diffuse pain.   Will culture urine, basic labs to check for possible infection or abnormal LFT's or lipase more suggestive of other etiology.  Also to check renal function if it is UTI or nephrolithiasis.    She has multiple allergies, will start coverage with macrobid, cannot use keflex (with ceftin allergy) and bactrim.  Will use C&S data to guide any needed tx.  Delsa Grana, PA-C 06/05/18 12:26 PM

## 2018-06-06 ENCOUNTER — Ambulatory Visit
Admission: RE | Admit: 2018-06-06 | Discharge: 2018-06-06 | Disposition: A | Discharge status: Home / Self Care | Payer: BC Managed Care – PPO | Source: Ambulatory Visit | Attending: Family Medicine | Admitting: Family Medicine

## 2018-06-06 LAB — URINE CULTURE
MICRO NUMBER:: 90763600
SPECIMEN QUALITY:: ADEQUATE

## 2018-06-07 ENCOUNTER — Encounter: Payer: Self-pay | Admitting: Family Medicine

## 2018-06-07 NOTE — Progress Notes (Signed)
No acute findings on [place], called and discussed findings with pt.

## 2018-06-27 ENCOUNTER — Other Ambulatory Visit: Payer: Self-pay | Admitting: Family Medicine

## 2018-06-27 MED ORDER — OXYCODONE-ACETAMINOPHEN 10-325 MG PO TABS: 1.0000 | ORAL_TABLET | Freq: Three times a day (TID) | ORAL | 0 refills | Status: DC | PRN

## 2018-06-27 NOTE — Telephone Encounter (Signed)
Patient requesting a refill on Oxycodone     LOV: 06/05/18  LRF:    04/04/18

## 2018-06-29 DIAGNOSIS — G43829 Menstrual migraine, not intractable, without status migrainosus: Secondary | ICD-10-CM | POA: Insufficient documentation

## 2018-06-29 DIAGNOSIS — K589 Irritable bowel syndrome without diarrhea: Secondary | ICD-10-CM | POA: Insufficient documentation

## 2018-07-23 ENCOUNTER — Telehealth: Payer: Self-pay | Admitting: Family Medicine

## 2018-07-23 NOTE — Telephone Encounter (Signed)
Pt would like to know if you would prescribe her the injection for migraines? (Her ins will cover Emgality not Aimovig)  She states that she went to her neuro and had all those shots in her head and it did not help much.

## 2018-07-25 NOTE — Telephone Encounter (Signed)
Yes I would prescribe but she needs an ov.

## 2018-07-25 NOTE — Telephone Encounter (Signed)
Called and lmovm informing pt of need for ov

## 2018-07-26 ENCOUNTER — Encounter: Payer: Self-pay | Admitting: Family Medicine

## 2018-07-26 ENCOUNTER — Ambulatory Visit: Payer: BC Managed Care – PPO | Admitting: Family Medicine

## 2018-07-26 VITALS — BP 106/70 | HR 84 | Temp 98.2°F | Resp 16 | Ht 60.0 in | Wt 188.0 lb

## 2018-07-26 DIAGNOSIS — G43909 Migraine, unspecified, not intractable, without status migrainosus: Secondary | ICD-10-CM

## 2018-07-26 MED ORDER — GALCANEZUMAB-GNLM 120 MG/ML ~~LOC~~ SOAJ: 120.0000 mg | SUBCUTANEOUS | 11 refills | Status: DC

## 2018-07-26 NOTE — Progress Notes (Signed)
Subjective:    [name] ID: Andrea [name], female    DOB: 14-Apr-1971, 47 y.o.   MRN: [id number]  HPI [name] has a history of chronic migraines.  She has 6-8 migraine headaches per month.  They are debilitating.  They are usually unilateral, associated with photophobia and phonophobia.  She is tried Topamax in the past which helped some however she discontinued the medication due to the sleepiness, dizziness, and memory loss.  She is tried trigger point injections through the care of a neurologist.  She has never tried Botox, propranolol, or Depakote.  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  They typically last 1 to 2 days when she has them.  She has to take Fioricet and occasionally Percocet to help ease her headaches. Past Medical History:  Diagnosis Date  . Angiolipoma of kidney   . Arthritis    "hands" (09/02/2013)  . B12 deficiency   . Epicondylitis, lateral    Right  . GERD (gastroesophageal reflux disease)   . IBS (irritable bowel syndrome)   . Migraine headache    "maybe 6-8 times/yr" (09/02/2013)  . Shortness of breath    "all the time when I came in today" (09/02/2013)   Past Surgical History:  Procedure Laterality Date  . APPENDECTOMY  1984  . PALATAL EXPANSION  2004  . REDUCTION MAMMAPLASTY Bilateral ~ 2009   Current Outpatient Medications on File Prior to Visit  Medication Sig Dispense Refill  . Albuterol Sulfate (PROAIR RESPICLICK) 174 (90 Base) MCG/ACT AEPB Inhale 2 puffs into the lungs every 4 (four) hours. 1 each 3  . ALPRAZolam (XANAX) 0.5 MG tablet TAKE 1 TABLET BY MOUTH AT BEDTIME AS NEEDED FOR ANXIETY OR SLEEP 30 tablet 2  . aspirin-acetaminophen-caffeine (EXCEDRIN MIGRAINE) [phone] MG per tablet Take 2 tablets by mouth every 6 (six) hours as needed for pain.    . baclofen (LIORESAL) 10 MG tablet     . butalbital-acetaminophen-caffeine (FIORICET/CODEINE)  50-325-40-30 MG capsule Take 1 capsule by mouth every 4 (four) hours as needed for headache (stop oxycodone). 30 capsule 0  . CAMILA 0.35 MG tablet Take 1 tablet by mouth daily.  3  . Cholecalciferol (VITAMIN D PO) Take by mouth.    . cyclobenzaprine (FLEXERIL) 10 MG tablet TAKE 1 TABLET BY MOUTH EVERY 8 HOURS AS NEEDED FOR MUSCLE SPASMS. 60 tablet 1  . DULoxetine (CYMBALTA) 60 MG capsule TAKE 1 CAPSULE BY MOUTH EVERY DAY 90 capsule 3  . ibuprofen (ADVIL,MOTRIN) 200 MG tablet Take 600 mg by mouth every 6 (six) hours as needed for pain.    Marland Kitchen LINZESS 290 MCG CAPS capsule TAKE 1 CAPSULE BY MOUTH EVERY OTHER DAY 30 capsule 0  . mometasone (ELOCON) 0.1 % cream Apply 1 application topically daily. 45 g 0  . oxyCODONE-acetaminophen (PERCOCET) 10-325 MG tablet Take 1 tablet by mouth every 8 (eight) hours as needed for pain (for migraines). 30 tablet 0  . promethazine (PHENERGAN) 25 MG tablet TAKE 1 TABELT BY MOUTH EVERY 4 HOURS 20 tablet 3   No current facility-administered medications on file prior to visit.    Allergies  Allergen Reactions  . Prednisone Shortness Of Breath  . Ceftin [Cefuroxime Axetil] Rash  . Sulfa Antibiotics Rash   Social History   Socioeconomic History  . Marital status: Married    Spouse name: Not on file  . Number of children: Not on file  .  Years of education: Not on file  . Highest education level: Not on file  Occupational History  . Not on file  Social Needs  . Financial resource strain: Not on file  . Food insecurity:    Worry: Not on file    Inability: Not on file  . Transportation needs:    Medical: Not on file    Non-medical: Not on file  Tobacco Use  . Smoking status: Never Smoker  . Smokeless tobacco: Never Used  Substance and Sexual Activity  . Alcohol use: No  . Drug use: No  . Sexual activity: Not Currently  Lifestyle  . Physical activity:    Days per week: Not on file    Minutes per session: Not on file  . Stress: Not on file    Relationships  . Social connections:    Talks on phone: Not on file    Gets together: Not on file    Attends religious service: Not on file    Active member of club or organization: Not on file    Attends meetings of clubs or organizations: Not on file    Relationship status: Not on file  . Intimate partner violence:    Fear of current or ex partner: Not on file    Emotionally abused: Not on file    Physically abused: Not on file    Forced sexual activity: Not on file  Other Topics Concern  . Not on file  Social History Narrative  . Not on file      Review of Systems  All other systems reviewed and are negative.      Objective:   Physical Exam  Constitutional: She is oriented to person, place, and time. She appears well-developed and well-nourished.  Cardiovascular: Normal rate, regular rhythm and normal heart sounds.  Pulmonary/Chest: Effort normal and breath sounds normal.  Neurological: She is alert and oriented to person, place, and time. She has normal strength. No cranial nerve deficit or sensory deficit. She displays a negative Romberg sign.  Vitals reviewed.         Assessment & Plan:  Migraine without status migrainosus, not intractable, unspecified migraine type [name] has tried and failed Topamax.  She is tried and failed trigger point injections.  She declines Depakote due to previous side effects that she experienced on Topamax.  She would like to try Emgality 240 mg subcu x1 and then 120 mg subcu monthly thereafter.  Reassess in 3 months.

## 2018-07-31 ENCOUNTER — Telehealth: Payer: Self-pay | Admitting: *Deleted

## 2018-07-31 ENCOUNTER — Other Ambulatory Visit: Payer: Self-pay | Admitting: Family Medicine

## 2018-07-31 MED ORDER — GALCANEZUMAB-GNLM 120 MG/ML ~~LOC~~ SOAJ: 120.0000 mg | SUBCUTANEOUS | 11 refills | Status: DC

## 2018-07-31 NOTE — Telephone Encounter (Signed)
Received request from pharmacy for PA on Emgality.  PA submitted.   Dx: G43.709- Chronic Migraine

## 2018-07-31 NOTE — Telephone Encounter (Signed)
Your information has been submitted to [employer]. To check for an updated outcome later, reopen this PA request from your dashboard. If [employer] has not responded to your request within 24 hours, contact [employer] at [phone]. If you think there may be a problem with your PA request, use our live chat feature at the bottom right.    

## 2018-07-31 NOTE — Telephone Encounter (Signed)
Your PA request has been approved. Additional information will be provided in the approval communication Pharm aware

## 2018-08-26 DIAGNOSIS — M7711 Lateral epicondylitis, right elbow: Secondary | ICD-10-CM | POA: Insufficient documentation

## 2018-08-26 DIAGNOSIS — M65331 Trigger finger, right middle finger: Secondary | ICD-10-CM | POA: Insufficient documentation

## 2018-09-01 ENCOUNTER — Other Ambulatory Visit: Payer: Self-pay | Admitting: Family Medicine

## 2018-09-02 NOTE — Telephone Encounter (Signed)
Requesting refill    Flexeril  LOV: 07/26/18  LRF:  02/11/18

## 2018-09-10 ENCOUNTER — Other Ambulatory Visit: Payer: Self-pay | Admitting: Family Medicine

## 2018-09-10 MED ORDER — OXYCODONE-ACETAMINOPHEN 10-325 MG PO TABS: 1.0000 | ORAL_TABLET | Freq: Three times a day (TID) | ORAL | 0 refills | Status: DC | PRN

## 2018-09-10 NOTE — Telephone Encounter (Signed)
Patient requesting a refill on Oxycodone     LOV: 07/26/18  LRF:    06/27/18   Pt also states that she thinks the Emgality is working well.

## 2018-09-19 ENCOUNTER — Other Ambulatory Visit: Payer: Self-pay | Admitting: Family Medicine

## 2018-09-20 NOTE — Telephone Encounter (Signed)
Ok to refill??  Last office visit 07/26/2018.  Last refill 03/28/2018,#2 refills.

## 2018-11-04 ENCOUNTER — Telehealth: Payer: Self-pay | Admitting: Family Medicine

## 2018-11-04 NOTE — Telephone Encounter (Signed)
PA Submitted through [email or web address] and received the following:  (Key: [id number]) Emgality 120MG /ML auto-injectors (migraine)  Form [facility] [employer] NON-MEDICARE Clinical Prior Authorization Criteria General Request Form  Plan Contact [phone] phone [phone] fax

## 2018-11-06 NOTE — Telephone Encounter (Signed)
Additional paperwork was faxed - form filled out and faxed back to [place].

## 2018-11-11 MED ORDER — GALCANEZUMAB-GNLM 120 MG/ML ~~LOC~~ SOAJ: 120.0000 mg | SUBCUTANEOUS | 11 refills | Status: DC

## 2018-11-11 NOTE — Telephone Encounter (Signed)
Approved through ins - pharmacy and pt aware via vm

## 2018-11-27 ENCOUNTER — Other Ambulatory Visit: Payer: Self-pay | Admitting: Family Medicine

## 2018-11-27 NOTE — Telephone Encounter (Signed)
Refill on oxycodone to [address]   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

## 2018-11-27 NOTE — Telephone Encounter (Signed)
Patient requesting a refill on Oxycodone     LOV:  07/26/18  LRF:

## 2018-11-28 MED ORDER — OXYCODONE-ACETAMINOPHEN 10-325 MG PO TABS: 1.0000 | ORAL_TABLET | Freq: Three times a day (TID) | ORAL | 0 refills | Status: DC | PRN

## 2019-02-05 ENCOUNTER — Encounter: Payer: Self-pay | Admitting: Family Medicine

## 2019-02-05 ENCOUNTER — Ambulatory Visit: Payer: BC Managed Care – PPO | Admitting: Family Medicine

## 2019-02-05 VITALS — BP 124/86 | HR 103 | Temp 98.1°F | Resp 15 | Ht 60.0 in | Wt 186.4 lb

## 2019-02-05 DIAGNOSIS — J209 Acute bronchitis, unspecified: Secondary | ICD-10-CM

## 2019-02-05 MED ORDER — IPRATROPIUM-ALBUTEROL 0.5-2.5 (3) MG/3ML IN SOLN: 3.0000 mL | Freq: Four times a day (QID) | RESPIRATORY_TRACT | 0 refills | Status: DC | PRN

## 2019-02-05 MED ORDER — HYDROCODONE-HOMATROPINE 5-1.5 MG/5ML PO SYRP: 5.0000 mL | ORAL_SOLUTION | Freq: Three times a day (TID) | ORAL | 0 refills | Status: DC | PRN

## 2019-02-05 MED ORDER — PREDNISONE 20 MG PO TABS: ORAL_TABLET | ORAL | 0 refills | Status: DC

## 2019-02-05 MED ORDER — IPRATROPIUM-ALBUTEROL 0.5-2.5 (3) MG/3ML IN SOLN
3.0000 mL | Freq: Once | RESPIRATORY_TRACT | Status: AC
  Administered 2019-02-05: 3 mL via RESPIRATORY_TRACT

## 2019-02-05 MED ORDER — BENZONATATE 100 MG PO CAPS: 100.0000 mg | ORAL_CAPSULE | Freq: Three times a day (TID) | ORAL | 0 refills | Status: DC | PRN

## 2019-02-05 MED ORDER — AZITHROMYCIN 250 MG PO TABS: ORAL_TABLET | ORAL | 0 refills | Status: DC

## 2019-02-05 NOTE — Progress Notes (Signed)
Andrea [name] ID: Andrea Andrea [name], female    DOB: 1971/11/26, 48 y.o.   MRN: [id number]  PCP: Susy Frizzle, MD  Chief Complaint  Andrea [name] presents with  . Cough    Andrea [name] in with c/o cough, wheezing, and rib pain. Andrea [name] went to urgent care and flu test was negative.    Subjective:   Andrea Andrea [name] is a 48 y.o. female, presents to clinic with CC of  Cough wheeze - sx started 4 days ago sx gradually started. Cough so severe, wet pants, not sleeping, she's propped up at night and still coughing severely, has wheeze, pain throughout ribs but worse in the left back side, has chest tightness and SOB.  She went to urgent care yesterday and was dx with sinusitis and was also given cough syrup and an inhaler.  She got augmentin to hold with the instructions to take if not better in 10-14 days.  She did not start augments, but did use cough syrup and is using inhaler.  With inhaler 3-4 x using a day still wheezing and sx severe, worsening.  Her cough yesterday and today with taking mucinex, did become productive and she had new subjective fever with night sweats and worsening fatigue. Not getting up to do much cause she knows how she feels - just to tired She has clear nasal drainage, but no severe facial/sinus pain or pressure, no neck pain, sore throat N/V/D.   Flu test yesterday was negative.  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  She reports a history of getting acute bronchitis at least once a year fairly severe.  She denies any history of asthma, COPD or smoking. Her chart has a documented allergy to prednisone causing shortness of breath.  She states that she has had prednisone multiple times in the past and that a few years ago a very high dose caused her to have some rash she denies any airway closure, shortness of breath, stridor, denies having to go to emergency room or use EpiPen or calling  EMS.  She believes she does not have an allergy to it at lower doses we reviewed dosing of 40 and 20 mg and did look up the last time she had it was a taper from 60-40-20.  At least 6 Rx for prednisone in 2016 and 2017.   Andrea [name] Active Problem List   Diagnosis Date Noted  . Fibromyalgia 08/15/2017  . Chest pain with low risk for cardiac etiology 09/02/2013  . Family history of early CAD 09/02/2013  . B12 deficiency   . Epicondylitis, lateral      Prior to Admission medications   Medication Sig Start Date End Date Taking? Authorizing Provider  Albuterol Sulfate (PROAIR RESPICLICK) 329 (90 Base) MCG/ACT AEPB Inhale 2 puffs into the lungs every 4 (four) hours. 08/10/17  Yes Susy Frizzle, MD  ALPRAZolam Duanne Moron) 0.5 MG tablet TAKE 1 TABLET BY MOUTH AT BEDTIME AS NEEDED FOR SLEEP OR ANXIETY 09/23/18  Yes Susy Frizzle, MD  brompheniramine-pseudoephedrine-DM 30-2-10 MG/5ML syrup  02/04/19  Yes [provider]  CAMILA 0.35 MG tablet Take 1 tablet by mouth daily. 01/17/15  Yes [provider]  Cholecalciferol (VITAMIN D PO) Take by mouth.   Yes [provider]  cyclobenzaprine (FLEXERIL) 10 MG tablet TAKE 1 TABLET BY MOUTH EVERY 8 HOURS AS NEEDED FOR MUSCLE SPASMS. 09/02/18  Yes Susy Frizzle, MD  DULoxetine (CYMBALTA) 60 MG capsule TAKE 1 CAPSULE BY MOUTH EVERY DAY 05/24/18  Yes Pickard, Cammie Mcgee, MD  Galcanezumab-gnlm (EMGALITY) 120 MG/ML SOAJ Inject 120 mg into the skin every 30 (thirty) days. 11/11/18  Yes Susy Frizzle, MD  ibuprofen (ADVIL,MOTRIN) 800 MG tablet  02/04/19  Yes [provider]  LINZESS 290 MCG CAPS capsule TAKE 1 CAPSULE BY MOUTH EVERY OTHER DAY 12/19/17  Yes Shipman, Modena Nunnery, MD  aspirin-acetaminophen-caffeine (EXCEDRIN MIGRAINE) [phone] MG per tablet Take 2 tablets by mouth every 6 (six) hours as needed for pain.    [provider]  baclofen (LIORESAL) 10 MG tablet  12/22/17   [provider]    butalbital-acetaminophen-caffeine (FIORICET/CODEINE) 50-325-40-30 MG capsule Take 1 capsule by mouth every 4 (four) hours as needed for headache (stop oxycodone). Andrea [name] not taking: Reported on 02/05/2019 08/27/17   Susy Frizzle, MD  ibuprofen (ADVIL,MOTRIN) 200 MG tablet Take 600 mg by mouth every 6 (six) hours as needed for pain.    [provider]  oxyCODONE-acetaminophen (PERCOCET) 10-325 MG tablet Take 1 tablet by mouth every 8 (eight) hours as needed for pain (for migraines). Andrea [name] not taking: Reported on 02/05/2019 11/28/18   Susy Frizzle, MD  promethazine (PHENERGAN) 25 MG tablet TAKE 1 TABELT BY MOUTH EVERY 4 HOURS Andrea [name] not taking: Reported on 02/05/2019 08/22/17   Susy Frizzle, MD     Allergies  Allergen Reactions  . Prednisone Shortness Of Breath  . Ceftin [Cefuroxime Axetil] Rash  . Sulfa Antibiotics Rash     Family History  Problem Relation Age of Onset  . Coronary artery disease Father 77       MI  . Heart attack Father   . Irregular heart beat Sister   . Arrhythmia Paternal Uncle      Social History   Socioeconomic History  . Marital status: Married    Spouse name: Not on file  . Number of children: Not on file  . Years of education: Not on file  . Highest education level: Not on file  Occupational History  . Not on file  Social Needs  . Financial resource strain: Not on file  . Food insecurity:    Worry: Not on file    Inability: Not on file  . Transportation needs:    Medical: Not on file    Non-medical: Not on file  Tobacco Use  . Smoking status: Never Smoker  . Smokeless tobacco: Never Used  Substance and Sexual Activity  . Alcohol use: No  . Drug use: No  . Sexual activity: Not Currently  Lifestyle  . Physical activity:    Days per week: Not on file    Minutes per session: Not on file  . Stress: Not on file  Relationships  . Social connections:    Talks on phone: Not on file    Gets together: Not on file     Attends religious service: Not on file    Active member of club or organization: Not on file    Attends meetings of clubs or organizations: Not on file    Relationship status: Not on file  . Intimate partner violence:    Fear of current or ex partner: Not on file    Emotionally abused: Not on file    Physically abused: Not on file    Forced sexual activity: Not on file  Other Topics Concern  . Not on file  Social History Narrative  . Not on  file     Review of Systems  Constitutional: Negative.   HENT: Negative.   Eyes: Negative.   Respiratory: Negative.   Cardiovascular: Negative.   Gastrointestinal: Negative.   Endocrine: Negative.   Genitourinary: Negative.   Musculoskeletal: Negative.   Skin: Negative.   Allergic/Immunologic: Negative.   Neurological: Negative.   Hematological: Negative.   Psychiatric/Behavioral: Negative.   All other systems reviewed and are negative.      Objective:    Vitals:   02/05/19 0959  BP: 124/86  Pulse: 100  Resp: 15  Temp: 98.1 F (36.7 C)  TempSrc: Oral  SpO2: 99%  Weight: 186 lb 6 oz (84.5 kg)  Height: 5' (1.524 m)      Physical Exam Vitals signs and nursing note reviewed.  Constitutional:      General: She is not in acute distress.    Appearance: She is well-developed. She is ill-appearing and diaphoretic (mildly). She is not toxic-appearing.  HENT:     Head: Normocephalic and atraumatic.     Right Ear: Hearing, tympanic membrane, ear canal and external ear normal.     Left Ear: Hearing, tympanic membrane, ear canal and external ear normal.     Nose: Mucosal edema, congestion and rhinorrhea present.     Right Sinus: No maxillary sinus tenderness or frontal sinus tenderness.     Left Sinus: No maxillary sinus tenderness or frontal sinus tenderness.     Mouth/Throat:     Mouth: Mucous membranes are moist. Mucous membranes are not pale.     Pharynx: Oropharynx is clear. Uvula midline. Posterior oropharyngeal erythema  (mild injection) present. No oropharyngeal exudate or uvula swelling.     Tonsils: No tonsillar abscesses.  Eyes:     General:        Right eye: No discharge.        Left eye: No discharge.     Conjunctiva/sclera: Conjunctivae normal.     Pupils: Pupils are equal, round, and reactive to light.  Neck:     Musculoskeletal: Normal range of motion and neck supple.     Trachea: No tracheal deviation.  Cardiovascular:     Rate and Rhythm: Regular rhythm. Tachycardia present.     Pulses: Normal pulses.     Heart sounds: Normal heart sounds. No murmur. No friction rub. No gallop.   Pulmonary:     Effort: Pulmonary effort is normal. No respiratory distress.     Breath sounds: No stridor. Wheezing and rhonchi present. No rales.     Comments: Diminished BS with rhonchi and expiratory wheeze auscultated to right middle and right lower lung field Chest:     Chest wall: No tenderness.  Abdominal:     General: Bowel sounds are normal. There is no distension.     Palpations: Abdomen is soft.     Tenderness: There is no abdominal tenderness. There is no guarding.  Musculoskeletal: Normal range of motion.  Lymphadenopathy:     Cervical: No cervical adenopathy.  Skin:    General: Skin is warm.     Capillary Refill: Capillary refill takes less than 2 seconds.     Coloration: Skin is not pale.     Findings: No rash.  Neurological:     Mental Status: She is alert.     Motor: No abnormal muscle tone.     Coordination: Coordination normal.  Psychiatric:        Behavior: Behavior normal.           Assessment &  Plan:      ICD-10-CM   1. Acute bronchitis, unspecified organism J20.9 azithromycin (ZITHROMAX) 250 MG tablet    HYDROcodone-homatropine (HYCODAN) 5-1.5 MG/5ML syrup    benzonatate (TESSALON) 100 MG capsule    predniSONE (DELTASONE) 20 MG tablet    ipratropium-albuterol (DUONEB) 0.5-2.5 (3) MG/3ML SOLN    ipratropium-albuterol (DUONEB) 0.5-2.5 (3) MG/3ML nebulizer solution 3 mL      Pt with hx of recurrent annual "bronchitis" presents with 4-5 days of illness, worsening with productive cough, subjective fever with night sweats, wheeze, SOB and pain to ribs moreso on right side.  Lung exam consistent with likely bronchitis and suspicious for possible CAP to RML/RLL.   Tx with meds as listed above.   Pt given duoneb in clinic and reevaluated afterwards.  BS improved to RML and RLL With severity of illness, hx of recurrent bronchitis, and suspicion of possible bacterial lower respiratory illness - tx with zpak, steroids and nebs, mucinex and OTC meds, f/up if no improvement   Delsa Grana, PA-C 02/05/19 10:03 AM

## 2019-02-05 NOTE — Patient Instructions (Signed)
Start Zpak and steroids today Use inhaler every 4-6 hours as needed for wheeze or shortness of breath Recommend doing a few nebulizers a day for the next few days - especially before bedtime to prevent bronchospasm/wheeze You can use the stronger cough medicine at night to help with sleep - take carefully - it can become stronger in combination with other sedating medicines, so use smallest dose, make sure someone is with you.  Avoid any other meds that would also make you sleepy (avoid muscle relaxers, phenergan, benadryl - check with your pharmacist as well to go over med interactions)  Can try the other cough medicines during the day - the cough syrup or tessalon perles.    I recommend using mucinex daily for the next week and drink ample clear fluids  If you do not improve, or if anything worsens in the next 5-7 days, please call or come back to be rechecked right away.

## 2019-02-11 ENCOUNTER — Ambulatory Visit: Payer: BC Managed Care – PPO | Admitting: Family Medicine

## 2019-02-11 ENCOUNTER — Encounter: Payer: Self-pay | Admitting: Family Medicine

## 2019-02-11 VITALS — BP 110/78 | HR 92 | Temp 97.8°F | Resp 16 | Ht 60.0 in | Wt 189.2 lb

## 2019-02-11 DIAGNOSIS — B9689 Other specified bacterial agents as the cause of diseases classified elsewhere: Secondary | ICD-10-CM

## 2019-02-11 DIAGNOSIS — R0602 Shortness of breath: Secondary | ICD-10-CM

## 2019-02-11 DIAGNOSIS — J209 Acute bronchitis, unspecified: Secondary | ICD-10-CM

## 2019-02-11 DIAGNOSIS — J22 Unspecified acute lower respiratory infection: Secondary | ICD-10-CM

## 2019-02-11 DIAGNOSIS — J988 Other specified respiratory disorders: Secondary | ICD-10-CM

## 2019-02-11 MED ORDER — METHYLPREDNISOLONE ACETATE 80 MG/ML IJ SUSP: 80.0000 mg | Freq: Once | INTRAMUSCULAR | Status: DC

## 2019-02-11 MED ORDER — IPRATROPIUM-ALBUTEROL 0.5-2.5 (3) MG/3ML IN SOLN
3.0000 mL | Freq: Once | RESPIRATORY_TRACT | Status: AC
  Administered 2019-02-11: 3 mL via RESPIRATORY_TRACT

## 2019-02-11 MED ORDER — PREDNISONE 20 MG PO TABS: 40.0000 mg | ORAL_TABLET | Freq: Every day | ORAL | 0 refills | Status: AC

## 2019-02-11 MED ORDER — ALBUTEROL SULFATE (2.5 MG/3ML) 0.083% IN NEBU
2.5000 mg | INHALATION_SOLUTION | Freq: Once | RESPIRATORY_TRACT | Status: AC
  Administered 2019-02-11: 2.5 mg via RESPIRATORY_TRACT

## 2019-02-11 MED ORDER — LEVOFLOXACIN 500 MG PO TABS: 500.0000 mg | ORAL_TABLET | Freq: Every day | ORAL | 0 refills | Status: DC

## 2019-02-11 NOTE — Progress Notes (Signed)
[name] ID: Andrea [name], female    DOB: 17-Aug-1971, 48 y.o.   MRN: [id number]  PCP: Susy Frizzle, MD  Chief Complaint  [name] presents with  . Fatigue    [name] in with c/o cough, and fatigue.     Subjective:   Andrea [name] is a 48 y.o. female, presents to clinic with CC of f/up for bronchitis, she has continued SOB, wheeze, cough, sweats/chills, fatigue and subjective fever.  Sx ongoing now for 10 days and she did not notice much improvement with zpak, prednisone cough meds after she was seen in clinic and tx.   She does feel less SOB and wheezy after doing duonebs, but over all she feels about the same.  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   She tolerated prednisone 40 mg dose tapered to 20 mg dose - no allergic rxn or sensation of airway or throat closure/swelling. She does continue to have nasal congestion and postnasal drip, no concerning associated sore throat, she has mildly decreased appetite but is eating and drinking fine and is specifically pushing ample clear fluids and urinating like normal. She has not developed any chest pain with breathing, no orthopnea, PND, lower extremity edema, palpitations, near-syncope.   No unintentional weight loss, rash, abdominal symptoms, confusion, lethargy, lymphadenopathy.     [name] Active Problem List   Diagnosis Date Noted  . Lateral epicondylitis of right elbow 08/26/2018  . Trigger middle finger of right hand 08/26/2018  . Irritable bowel syndrome 06/29/2018  . Menstrual migraine 06/29/2018  . Fibromyalgia 08/15/2017  . Chest pain with low risk for cardiac etiology 09/02/2013  . Family history of early CAD 09/02/2013  . B12 deficiency      Prior to Admission medications   Medication Sig Start Date End Date Taking? Authorizing Provider    Albuterol Sulfate (PROAIR RESPICLICK) 952 (90 Base) MCG/ACT AEPB Inhale 2 puffs into the lungs every 4 (four) hours. 08/10/17  Yes Susy Frizzle, MD  ALPRAZolam Duanne Moron) 0.5 MG tablet TAKE 1 TABLET BY MOUTH AT BEDTIME AS NEEDED FOR SLEEP OR ANXIETY 09/23/18  Yes Susy Frizzle, MD  aspirin-acetaminophen-caffeine (EXCEDRIN MIGRAINE) [phone] MG per tablet Take 2 tablets by mouth every 6 (six) hours as needed for pain.   Yes [provider]  baclofen (LIORESAL) 10 MG tablet  12/22/17  Yes [provider]  benzonatate (TESSALON) 100 MG capsule Take 1 capsule (100 mg total) by mouth 3 (three) times daily as needed for cough. 02/05/19  Yes Delsa Grana, PA-C  brompheniramine-pseudoephedrine-DM 30-2-10 MG/5ML syrup  02/04/19  Yes [provider]  butalbital-acetaminophen-caffeine (FIORICET/CODEINE) 50-325-40-30 MG capsule Take 1 capsule by mouth every 4 (four) hours as needed for headache (stop oxycodone). 08/27/17  Yes Susy Frizzle, MD  CAMILA 0.35 MG tablet Take 1 tablet by mouth daily. 01/17/15  Yes [provider]  Cholecalciferol (VITAMIN D PO) Take by mouth.   Yes [provider]  cyclobenzaprine (FLEXERIL) 10 MG tablet TAKE 1 TABLET BY MOUTH EVERY 8 HOURS AS NEEDED FOR MUSCLE SPASMS. 09/02/18  Yes Susy Frizzle, MD  DULoxetine (CYMBALTA) 60 MG capsule TAKE 1 CAPSULE BY MOUTH EVERY DAY 05/24/18  Yes Pickard, Cammie Mcgee, MD  Galcanezumab-gnlm (EMGALITY) 120 MG/ML SOAJ Inject 120 mg into the skin every  30 (thirty) days. 11/11/18  Yes Susy Frizzle, MD  HYDROcodone-homatropine [facility]) 5-1.5 MG/5ML syrup Take 5 mLs by mouth every 8 (eight) hours as needed for cough. 02/05/19  Yes Delsa Grana, PA-C  ibuprofen (ADVIL,MOTRIN) 800 MG tablet  02/04/19  Yes [provider]  ipratropium-albuterol (DUONEB) 0.5-2.5 (3) MG/3ML SOLN Take 3 mLs by nebulization every 6 (six) hours as needed (for wheeze, SOB, bronchospams/coughing fits). 02/05/19  Yes  Delsa Grana, PA-C  LINZESS 290 MCG CAPS capsule TAKE 1 CAPSULE BY MOUTH EVERY OTHER DAY 12/19/17  Yes [place], Modena Nunnery, MD  norethindrone (CAMILA) 0.35 MG tablet Camila 0.35 mg tablet   Yes [provider]  Omega-3 1000 MG CAPS Take by mouth.   Yes [provider]  oxyCODONE-acetaminophen (PERCOCET) 10-325 MG tablet Take 1 tablet by mouth every 8 (eight) hours as needed for pain (for migraines). 11/28/18  Yes Susy Frizzle, MD  predniSONE (DELTASONE) 20 MG tablet 2 tabs poqday 1-3, 1 tabs poqday 4-6 02/05/19  Yes Delsa Grana, PA-C  promethazine (PHENERGAN) 25 MG tablet TAKE 1 TABELT BY MOUTH EVERY 4 HOURS 08/22/17  Yes Susy Frizzle, MD  zonisamide (ZONEGRAN) 50 MG capsule zonisamide 50 mg capsule   Yes [provider]     Allergies  Allergen Reactions  . Prednisone Rash       . Ceftin [Cefuroxime Axetil] Rash  . Sulfa Antibiotics Rash     Family History  Problem Relation Age of Onset  . Coronary artery disease Father 30       MI  . Heart attack Father   . Irregular heart beat Sister   . Arrhythmia Paternal Uncle      Social History   Socioeconomic History  . Marital status: Married    Spouse name: Not on file  . Number of children: Not on file  . Years of education: Not on file  . Highest education level: Not on file  Occupational History  . Not on file  Social Needs  . Financial resource strain: Not on file  . Food insecurity:    Worry: Not on file    Inability: Not on file  . Transportation needs:    Medical: Not on file    Non-medical: Not on file  Tobacco Use  . Smoking status: Never Smoker  . Smokeless tobacco: Never Used  Substance and Sexual Activity  . Alcohol use: No  . Drug use: No  . Sexual activity: Not Currently  Lifestyle  . Physical activity:    Days per week: Not on file    Minutes per session: Not on file  . Stress: Not on file  Relationships  . Social connections:    Talks on phone: Not on file    Gets  together: Not on file    Attends religious service: Not on file    Active member of club or organization: Not on file    Attends meetings of clubs or organizations: Not on file    Relationship status: Not on file  . Intimate partner violence:    Fear of current or ex partner: Not on file    Emotionally abused: Not on file    Physically abused: Not on file    Forced sexual activity: Not on file  Other Topics Concern  . Not on file  Social History Narrative  . Not on file     Review of Systems  Constitutional: Positive for activity change, chills, diaphoresis, fatigue and fever. Negative for  unexpected weight change.  HENT: Negative.   Eyes: Negative.   Respiratory: Positive for cough, chest tightness, shortness of breath and wheezing. Negative for apnea, choking and stridor.   Cardiovascular: Negative.  Negative for chest pain, palpitations and leg swelling.  Gastrointestinal: Negative.   Endocrine: Negative.   Genitourinary: Negative.   Musculoskeletal: Negative.  Negative for arthralgias, gait problem, joint swelling and myalgias.  Skin: Negative.  Negative for color change, pallor and rash.  Allergic/Immunologic: Negative.   Neurological: Negative.  Negative for tremors, syncope and headaches.  Hematological: Negative.  Negative for adenopathy. Does not bruise/bleed easily.  Psychiatric/Behavioral: Negative.   All other systems reviewed and are negative.      Objective:    Vitals:   02/11/19 0825  BP: 110/78  Pulse: 92  Resp: 16  Temp: 97.8 F (36.6 C)  TempSrc: Oral  SpO2: 98%  Weight: 189 lb 4 oz (85.8 kg)  Height: 5' (1.524 m)      Physical Exam Vitals signs and nursing note reviewed.  Constitutional:      General: She is not in acute distress.    Appearance: She is well-developed. She is ill-appearing (mildly ill appearing). She is not toxic-appearing or diaphoretic.     Interventions: She is not intubated. HENT:     Head: Normocephalic and  atraumatic.     Right Ear: Hearing, tympanic membrane, ear canal and external ear normal.     Left Ear: Hearing, tympanic membrane, ear canal and external ear normal.     Nose: Mucosal edema, congestion and rhinorrhea present.     Right Sinus: No maxillary sinus tenderness or frontal sinus tenderness.     Left Sinus: No maxillary sinus tenderness or frontal sinus tenderness.     Mouth/Throat:     Mouth: Mucous membranes are not pale and dry.     Pharynx: Uvula midline. No oropharyngeal exudate, posterior oropharyngeal erythema or uvula swelling.     Tonsils: No tonsillar abscesses.  Eyes:     General: No scleral icterus.       Right eye: No discharge.        Left eye: No discharge.     Conjunctiva/sclera: Conjunctivae normal.     Pupils: Pupils are equal, round, and reactive to light.  Neck:     Musculoskeletal: Normal range of motion and neck supple.     Trachea: No tracheal deviation.  Cardiovascular:     Rate and Rhythm: Normal rate and regular rhythm.     Pulses: Normal pulses.     Heart sounds: Normal heart sounds. No murmur. No friction rub. No gallop.   Pulmonary:     Effort: Pulmonary effort is normal. No accessory muscle usage, prolonged expiration or respiratory distress. She is not intubated.     Breath sounds: No stridor. Wheezing and rhonchi present. No rales.     Comments: Coarse rhonchi and bronchial transmitted BS to b/l mid and lower lung fields with exp wheeze Chest:     Chest wall: No tenderness.  Abdominal:     General: Bowel sounds are normal. There is no distension.     Palpations: Abdomen is soft.     Tenderness: There is no abdominal tenderness. There is no guarding.  Musculoskeletal: Normal range of motion.     Right lower leg: No edema.     Left lower leg: No edema.  Skin:    General: Skin is warm and dry.     Capillary Refill: Capillary refill takes less than  2 seconds.     Coloration: Skin is not pale.     Findings: No rash.  Neurological:      Mental Status: She is alert.     Motor: No weakness or abnormal muscle tone.     Coordination: Coordination normal.  Psychiatric:        Behavior: Behavior normal.     Breathing treatment given - duoneb + additional albuterol [name] was reexamined after nebulizer treatment, she had improved breath sounds to all lung fields bilaterally with posterior lateral and anterior auscultation, rhonchi resolved but she continued to have some expiratory wheeze and continued to have absence of rales.  She had improved work of breathing, noted improved symptoms in her chest.      Assessment & Plan:      ICD-10-CM   1. Acute bronchitis, unspecified organism J20.9 ipratropium-albuterol (DUONEB) 0.5-2.5 (3) MG/3ML nebulizer solution 3 mL    albuterol (PROVENTIL) (2.5 MG/3ML) 0.083% nebulizer solution 2.5 mg    levofloxacin (LEVAQUIN) 500 MG tablet    predniSONE (DELTASONE) 20 MG tablet  2. Bacterial lower respiratory infection J98.8 levofloxacin (LEVAQUIN) 500 MG tablet   concern for development of with severe sx, not improving after 10 days, failed tx with zpak steroids, cough meds and mucinex    [name] here with continued shortness of breath, cough, wheeze, previously diagnosed and treated for acute bronchitis, and concern for development of lower respiratory tract illness, especially in the setting of virtually no improvement with steroid burst, inhalers, Z-Pak treatment.  She plans on returning to her job immediately after leaving clinic so she was given a steroid injection IM in clinic - and will get her steroid prescription later today from the pharmacy with instructions to start her next dose tomorrow morning.  She was encouraged to use duo nebs 2-3 times a day while still dealing with moderate to severe wheeze, shortness of breath, chest tightness and coughing and while out or at work to use inhaler more frequently.  Will treat with Levaquin, [name] strongly urged to follow-up with [place] Friday  especially if not noting any improvement.  Would likely get CXR done if still not improving.  Pt agrees with tx and f/up plan.  Understands return and ER precautions.   Delsa Grana, PA-C 02/11/19 8:54 AM

## 2019-03-04 ENCOUNTER — Other Ambulatory Visit: Payer: Self-pay | Admitting: Family Medicine

## 2019-03-04 MED ORDER — OXYCODONE-ACETAMINOPHEN 10-325 MG PO TABS: 1.0000 | ORAL_TABLET | Freq: Three times a day (TID) | ORAL | 0 refills | Status: DC | PRN

## 2019-03-04 NOTE — Telephone Encounter (Signed)
Patient requesting a refill on Oxycodone     LOV: 07/26/18  LRF:     11/28/18

## 2019-03-06 ENCOUNTER — Other Ambulatory Visit: Payer: Self-pay | Admitting: Family Medicine

## 2019-03-06 NOTE — Telephone Encounter (Signed)
Ok to refill??  Last office visit 02/11/2019.  Last refill 09/24/2019, #2 refills.

## 2019-03-15 IMAGING — CR DG CHEST 2V
2 series · 2 of 2 positions shown · non-contrast
Comparison: 10/14/2015

CLINICAL DATA: Productive cough and shortness of breath for 4
weeks.

EXAM:
CHEST  2 VIEW

[w chest pa]
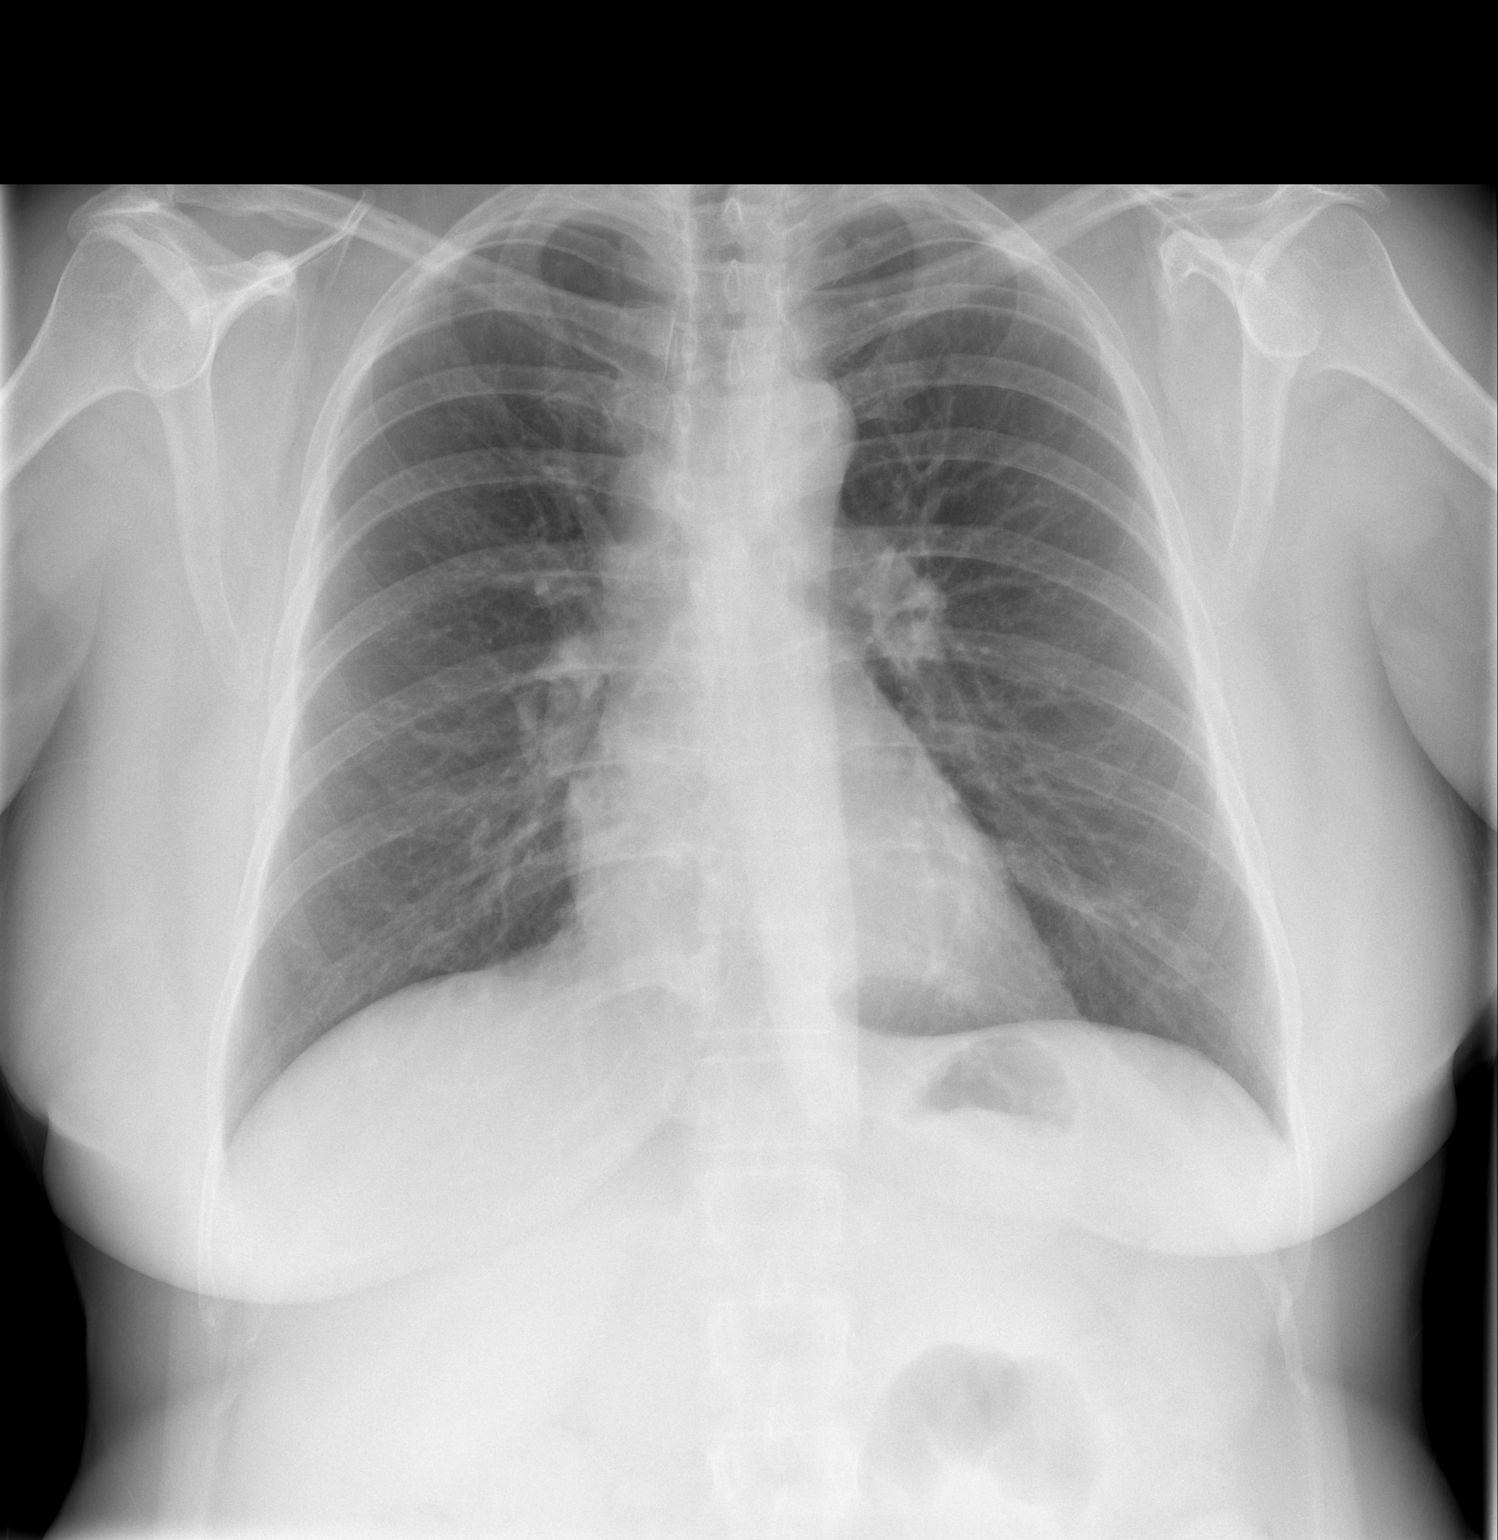

[w chest lat]
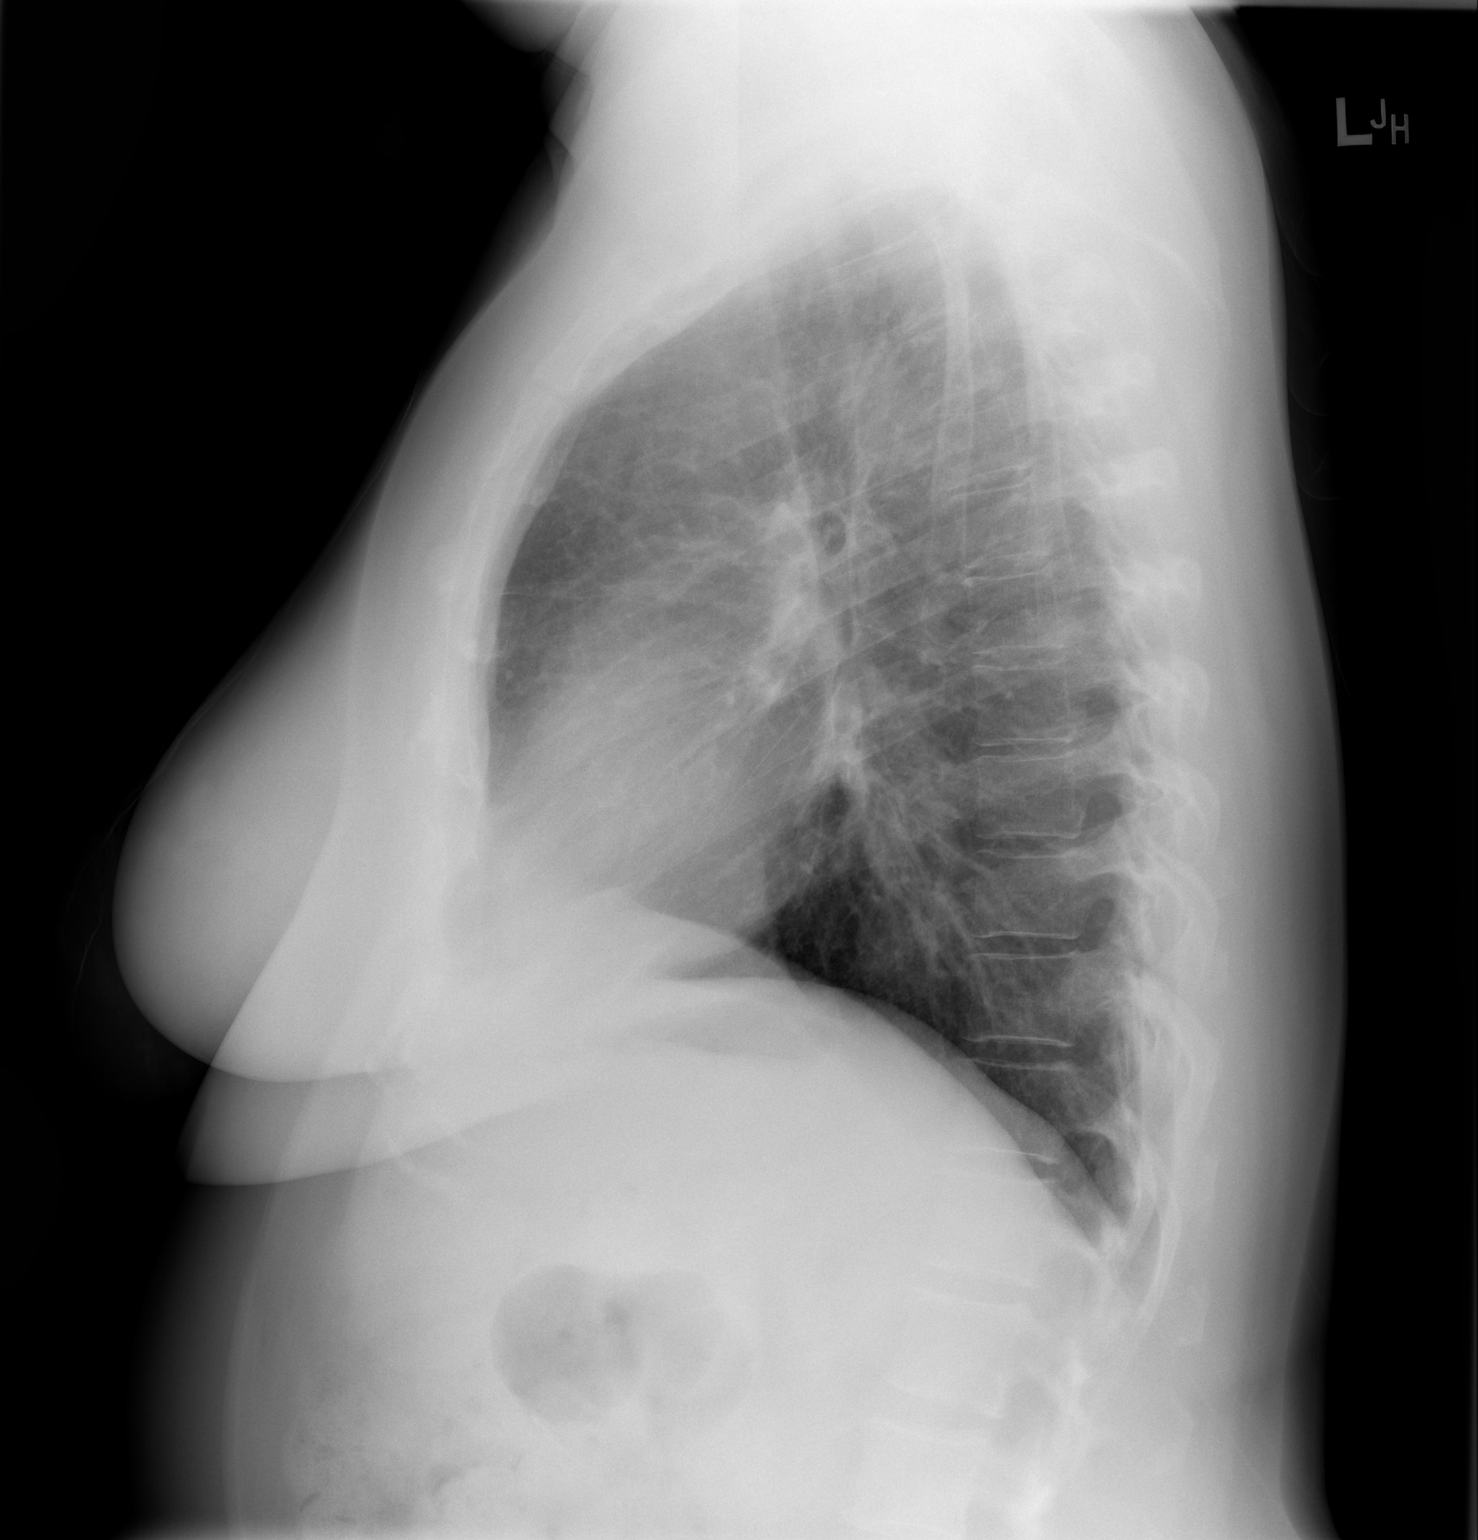

[2 of 2 positions shown; findings below may reference images not displayed]

FINDINGS: The heart size and mediastinal contours are within normal limits.
Both lungs are clear. The visualized skeletal structures are
unremarkable.
IMPRESSION: No active cardiopulmonary disease.

## 2019-04-29 MED ORDER — PHENTERMINE HCL 37.5 MG PO CAPS: 37.5000 mg | ORAL_CAPSULE | ORAL | 2 refills | Status: DC

## 2019-04-29 NOTE — Telephone Encounter (Signed)
Requesting refill on Phentermine  - see mychart notes. Ok to refill? Last refill was 05/25/17 (Rx set up ready to send)

## 2019-05-07 ENCOUNTER — Other Ambulatory Visit: Payer: Self-pay | Admitting: Family Medicine

## 2019-05-09 ENCOUNTER — Encounter: Payer: Self-pay | Admitting: Family Medicine

## 2019-05-12 MED ORDER — PROMETHAZINE HCL 25 MG PO TABS: ORAL_TABLET | ORAL | 1 refills | Status: DC

## 2019-05-12 NOTE — Telephone Encounter (Signed)
Phenergan was last refilled on 08/2017. May we refill?

## 2019-06-05 ENCOUNTER — Other Ambulatory Visit: Payer: Self-pay | Admitting: Family Medicine

## 2019-06-05 MED ORDER — CYCLOBENZAPRINE HCL 10 MG PO TABS: ORAL_TABLET | ORAL | 1 refills | Status: DC

## 2019-06-27 ENCOUNTER — Other Ambulatory Visit: Payer: Self-pay

## 2019-06-30 ENCOUNTER — Ambulatory Visit: Payer: BC Managed Care – PPO | Admitting: Family Medicine

## 2019-07-07 ENCOUNTER — Other Ambulatory Visit: Payer: Self-pay

## 2019-07-08 ENCOUNTER — Ambulatory Visit: Payer: BC Managed Care – PPO | Admitting: Family Medicine

## 2019-07-08 ENCOUNTER — Encounter: Payer: Self-pay | Admitting: Family Medicine

## 2019-07-08 VITALS — BP 100/60 | HR 96 | Temp 98.8°F | Resp 16 | Ht 60.0 in | Wt 186.0 lb

## 2019-07-08 DIAGNOSIS — R61 Generalized hyperhidrosis: Secondary | ICD-10-CM | POA: Diagnosis not present

## 2019-07-08 MED ORDER — METRONIDAZOLE 0.75 % VA GEL: 1.0000 | Freq: Two times a day (BID) | VAGINAL | 3 refills | Status: DC

## 2019-07-08 NOTE — Progress Notes (Signed)
Subjective:    Andrea [name] ID: Andrea Andrea [name], female    DOB: May 20, 1971, 48 y.o.   MRN: [id number]  HPI Andrea [name] presents today because she reports she is sweating profusely for no reason.  She states she can do very little and she will suddenly start to sweat for no reason.  It is extremely bad when she is outside in the heat.  She denies any weight loss.  She denies any fevers.  She denies any palpitations.  She denies any chest pain or shortness of breath or nausea or vomiting.  She recently saw GYN who told her that she is not premenopausal.  Therefore she would like to have her thyroid checked today to see if it could be her thyroid.  These issues.  Her blood pressure is normal suggesting against a pheochromocytoma.  She denies any severe diarrhea or facial flushing suggesting against carcinoid syndrome.  She also has recently had 2 episodes of bacterial vaginosis treated with oral Flagyl.  She would like to have metronidazole cream if possible that she could take in the future to help prevent this or treated aggressively early on so it does not get as bad as it got recently. Past Medical History:  Diagnosis Date  . Angiolipoma of kidney   . Arthritis    "hands" (09/02/2013)  . B12 deficiency   . Epicondylitis, lateral    Right  . GERD (gastroesophageal reflux disease)   . IBS (irritable bowel syndrome)   . Migraine headache    "maybe 6-8 times/yr" (09/02/2013)  . Shortness of breath    "all the time when I came in today" (09/02/2013)   Past Surgical History:  Procedure Laterality Date  . APPENDECTOMY  1984  . PALATAL EXPANSION  2004  . REDUCTION MAMMAPLASTY Bilateral ~ 2009   Current Outpatient Medications on File Prior to Visit  Medication Sig Dispense Refill  . Albuterol Sulfate (PROAIR RESPICLICK) 563 (90 Base) MCG/ACT AEPB Inhale 2 puffs into the lungs every 4 (four) hours. 1 each 3  . ALPRAZolam (XANAX) 0.5 MG tablet TAKE 1 TABLET BY MOUTH AT BEDTIME AS NEEDED FOR SLEEP OR  ANXIETY 30 tablet 2  . aspirin-acetaminophen-caffeine (EXCEDRIN MIGRAINE) 875-643-32 MG per tablet Take 2 tablets by mouth every 6 (six) hours as needed for pain.    . butalbital-acetaminophen-caffeine (FIORICET/CODEINE) 50-325-40-30 MG capsule Take 1 capsule by mouth every 4 (four) hours as needed for headache (stop oxycodone). 30 capsule 0  . CAMILA 0.35 MG tablet Take 1 tablet by mouth daily.  3  . Cholecalciferol (VITAMIN D PO) Take by mouth.    . cyclobenzaprine (FLEXERIL) 10 MG tablet TAKE 1 TABLET BY MOUTH EVERY 8 HOURS AS NEEDED FOR MUSCLE SPASMS. 60 tablet 1  . DULoxetine (CYMBALTA) 60 MG capsule TAKE 1 CAPSULE BY MOUTH EVERY DAY 90 capsule 3  . Galcanezumab-gnlm (EMGALITY) 120 MG/ML SOAJ Inject 120 mg into the skin every 30 (thirty) days. 1 pen 11  . ibuprofen (ADVIL,MOTRIN) 800 MG tablet     . ipratropium-albuterol (DUONEB) 0.5-2.5 (3) MG/3ML SOLN Take 3 mLs by nebulization every 6 (six) hours as needed (for wheeze, SOB, bronchospams/coughing fits). 90 mL 0  . LINZESS 290 MCG CAPS capsule TAKE 1 CAPSULE BY MOUTH EVERY OTHER DAY 30 capsule 0  . norethindrone (CAMILA) 0.35 MG tablet Camila 0.35 mg tablet    . Omega-3 1000 MG CAPS Take by mouth.    . oxyCODONE-acetaminophen (PERCOCET) 10-325 MG tablet Take 1 tablet by mouth every 8 (eight)  hours as needed for pain (for migraines). 30 tablet 0  . phentermine 37.5 MG capsule Take 1 capsule (37.5 mg total) by mouth every morning. 30 capsule 2  . promethazine (PHENERGAN) 25 MG tablet TAKE 1 TABELT BY MOUTH EVERY 4 HOURS 20 tablet 1  . zonisamide (ZONEGRAN) 50 MG capsule zonisamide 50 mg capsule     No current facility-administered medications on file prior to visit.    Allergies  Allergen Reactions  . Prednisone Rash       . Ceftin [Cefuroxime Axetil] Rash  . Sulfa Antibiotics Rash   Social History   Socioeconomic History  . Marital status: Married    Spouse name: Not on file  . Number of children: Not on file  . Years of  education: Not on file  . Highest education level: Not on file  Occupational History  . Not on file  Social Needs  . Financial resource strain: Not on file  . Food insecurity    Worry: Not on file    Inability: Not on file  . Transportation needs    Medical: Not on file    Non-medical: Not on file  Tobacco Use  . Smoking status: Never Smoker  . Smokeless tobacco: Never Used  Substance and Sexual Activity  . Alcohol use: No  . Drug use: No  . Sexual activity: Not Currently  Lifestyle  . Physical activity    Days per week: Not on file    Minutes per session: Not on file  . Stress: Not on file  Relationships  . Social Herbalist on phone: Not on file    Gets together: Not on file    Attends religious service: Not on file    Active member of club or organization: Not on file    Attends meetings of clubs or organizations: Not on file    Relationship status: Not on file  . Intimate partner violence    Fear of current or ex partner: Not on file    Emotionally abused: Not on file    Physically abused: Not on file    Forced sexual activity: Not on file  Other Topics Concern  . Not on file  Social History Narrative  . Not on file      Review of Systems  All other systems reviewed and are negative.      Objective:   Physical Exam  Constitutional: She is oriented to person, place, and time. She appears well-developed and well-nourished. No distress.  Neck: No JVD present. No thyromegaly present.  Cardiovascular: Normal rate, regular rhythm and normal heart sounds. Exam reveals no gallop and no friction rub.  No murmur heard. Pulmonary/Chest: Effort normal and breath sounds normal. No respiratory distress. She has no wheezes. She has no rales. She exhibits no tenderness.  Abdominal: Soft. Bowel sounds are normal.  Lymphadenopathy:    She has no cervical adenopathy.  Neurological: She is alert and oriented to person, place, and time. She has normal strength. No  cranial nerve deficit or sensory deficit. She displays a negative Romberg sign.  Skin: She is not diaphoretic.  Vitals reviewed.         Assessment & Plan:  1. Sweating profusely I believe this is just physiologic secondary to the heat of the current season coupled with her natural physiology.  However I will check a TSH to rule out hypothyroidism.  I will also check a CBC to evaluate for any bone marrow abnormalities that  would suggest an underlying malignancy and check a CMP to evaluate for any electrolyte disturbances.  We will use metronidazole gel applied twice daily into the vagina for BV and I gave her a prescription for this to be used as needed - TSH - CBC with Differential/Platelet - COMPLETE METABOLIC PANEL WITH GFR

## 2019-07-09 LAB — COMPLETE METABOLIC PANEL WITH GFR
AG Ratio: 1.6 (calc) (ref 1.0–2.5)
ALT: 21 U/L (ref 6–29)
AST: 22 U/L (ref 10–35)
Albumin: 4.1 g/dL (ref 3.6–5.1)
Alkaline phosphatase (APISO): 76 U/L (ref 31–125)
BUN: 11 mg/dL (ref 7–25)
CO2: 23 mmol/L (ref 20–32)
Calcium: 9.5 mg/dL (ref 8.6–10.2)
Chloride: 104 mmol/L (ref 98–110)
Creat: 0.8 mg/dL (ref 0.50–1.10)
GFR, Est African American: 101 mL/min/{1.73_m2} (ref >=60)
GFR, Est Non African American: 87 mL/min/{1.73_m2} (ref >=60)
Globulin: 2.6 g/dL (calc) (ref 1.9–3.7)
Glucose, Bld: 86 mg/dL (ref 65–99)
Potassium: 4.2 mmol/L (ref 3.5–5.3)
Sodium: 139 mmol/L (ref 135–146)
Total Bilirubin: 0.4 mg/dL (ref 0.2–1.2)
Total Protein: 6.7 g/dL (ref 6.1–8.1)

## 2019-07-09 LAB — CBC WITH DIFFERENTIAL/PLATELET
Absolute Monocytes: 343 cells/uL (ref 200–950)
Basophils Absolute: 62 cells/uL (ref 0–200)
Basophils Relative: 0.8 %
Eosinophils Absolute: 109 cells/uL (ref 15–500)
Eosinophils Relative: 1.4 %
HCT: 43.2 % (ref 35.0–45.0)
Hemoglobin: 13.7 g/dL (ref 11.7–15.5)
Lymphs Abs: 1303 cells/uL (ref 850–3900)
MCH: 25.9 pg — ABNORMAL LOW (ref 27.0–33.0)
MCHC: 31.7 g/dL — ABNORMAL LOW (ref 32.0–36.0)
MCV: 81.8 fL (ref 80.0–100.0)
MPV: 11.2 fL (ref 7.5–12.5)
Monocytes Relative: 4.4 %
Neutro Abs: 5983 cells/uL (ref 1500–7800)
Neutrophils Relative %: 76.7 %
Platelets: 293 10*3/uL (ref 140–400)
RBC: 5.28 10*6/uL — ABNORMAL HIGH (ref 3.80–5.10)
RDW: 13.2 % (ref 11.0–15.0)
Total Lymphocyte: 16.7 %
WBC: 7.8 10*3/uL (ref 3.8–10.8)

## 2019-07-09 LAB — TSH: TSH: 0.97 mIU/L

## 2019-07-18 ENCOUNTER — Other Ambulatory Visit: Payer: Self-pay

## 2019-07-18 MED ORDER — OXYCODONE-ACETAMINOPHEN 10-325 MG PO TABS: 1.0000 | ORAL_TABLET | Freq: Three times a day (TID) | ORAL | 0 refills | Status: DC | PRN

## 2019-07-18 NOTE — Telephone Encounter (Signed)
Requested Prescriptions   Pending Prescriptions Disp Refills  . oxyCODONE-acetaminophen (PERCOCET) 10-325 MG tablet 30 tablet 0    Sig: Take 1 tablet by mouth every 8 (eight) hours as needed for pain (for migraines).    Last OV 07/08/2019  Last written 03/04/2019

## 2019-07-22 ENCOUNTER — Telehealth: Payer: Self-pay | Admitting: Family Medicine

## 2019-07-22 NOTE — Telephone Encounter (Signed)
PA Submitted through [email or web address] and received the following:  Your information has been submitted to [place]. To check for an updated outcome later, reopen this PA request from your dashboard.  If [facility] has not responded to your request within 24 hours, contact [place] at [phone]. If you think there may be a problem with your PA request, use our live chat feature at the bottom right.

## 2019-07-23 NOTE — Telephone Encounter (Signed)
Your PA request has been approved. Additional information will be provided in the approval communication. (Message 1145) - Pharm made aware

## 2019-08-06 ENCOUNTER — Encounter: Payer: Self-pay | Admitting: Family Medicine

## 2019-08-29 ENCOUNTER — Encounter: Payer: Self-pay | Admitting: Family Medicine

## 2019-08-29 ENCOUNTER — Other Ambulatory Visit: Payer: Self-pay | Admitting: Family Medicine

## 2019-08-29 ENCOUNTER — Ambulatory Visit: Payer: BC Managed Care – PPO | Admitting: Family Medicine

## 2019-08-29 ENCOUNTER — Other Ambulatory Visit: Payer: Self-pay

## 2019-08-29 ENCOUNTER — Ambulatory Visit
Admission: RE | Admit: 2019-08-29 | Discharge: 2019-08-29 | Disposition: A | Discharge status: Home / Self Care | Payer: BC Managed Care – PPO | Source: Ambulatory Visit | Attending: Family Medicine | Admitting: Family Medicine

## 2019-08-29 VITALS — BP 106/64 | HR 96 | Temp 98.5°F | Resp 16 | Ht 60.0 in | Wt 183.0 lb

## 2019-08-29 DIAGNOSIS — R0781 Pleurodynia: Secondary | ICD-10-CM

## 2019-08-29 DIAGNOSIS — M7712 Lateral epicondylitis, left elbow: Secondary | ICD-10-CM | POA: Diagnosis not present

## 2019-08-29 DIAGNOSIS — M7711 Lateral epicondylitis, right elbow: Secondary | ICD-10-CM | POA: Diagnosis not present

## 2019-08-29 NOTE — Progress Notes (Signed)
Subjective:    [name] ID: Andrea [name], female    DOB: [birth year], 48 y.o.   MRN: [id number]  HPI [name] presents today complaining of pain in her right flank.  She states is been sore for several months.  Perhaps 2 months or more.  She is tender to palpation over her right lower ribs.  It is mainly the ninth and 10th rib.  I am able to reproduce her pain by pressing on the ribs in this area.  She denies any cough.  She denies any pleurisy.  She denies any hematin emesis.  She denies any hemoptysis.  She denies any shortness of breath.  She denies any chest pain.  She denies any pleurisy.  However she states that she can "feel it" in that area when she breathes deep.  She denies any hematuria or dysuria or urgency or frequency.  She had a renal ultrasound in June of last year that showed a small cyst on the right kidney but was otherwise normal.  Of note the pain began after she fell down approximately 20 steps the [place] the beach.  She also complains of pain in both elbows.  The pain is located around the lateral epicondyle and in the groove between the lateral epicondyle and olecranon.  The pain is exacerbated by her grabbing objects and dorsiflexing her wrist.  The right elbow causes more pain in the left elbow. Past Medical History:  Diagnosis Date  . Angiolipoma of kidney   . Arthritis    "hands" (09/02/2013)  . B12 deficiency   . Epicondylitis, lateral    Right  . GERD (gastroesophageal reflux disease)   . IBS (irritable bowel syndrome)   . Migraine headache    "maybe 6-8 times/yr" (09/02/2013)  . Shortness of breath    "all the time when I came in today" (09/02/2013)   Past Surgical History:  Procedure Laterality Date  . APPENDECTOMY  1984  . PALATAL EXPANSION  2004  . REDUCTION MAMMAPLASTY Bilateral ~ 2009   Current Outpatient Medications on File Prior to Visit  Medication Sig Dispense Refill  . Albuterol Sulfate (PROAIR RESPICLICK) [id number] (90 Base)  MCG/ACT AEPB Inhale 2 puffs into the lungs every 4 (four) hours. 1 each 3  . ALPRAZolam (XANAX) 0.5 MG tablet TAKE 1 TABLET BY MOUTH AT BEDTIME AS NEEDED FOR SLEEP OR ANXIETY 30 tablet 2  . aspirin-acetaminophen-caffeine (EXCEDRIN MIGRAINE) [id number] MG per tablet Take 2 tablets by mouth every 6 (six) hours as needed for pain.    . butalbital-acetaminophen-caffeine (FIORICET/CODEINE) 50-325-40-30 MG capsule Take 1 capsule by mouth every 4 (four) hours as needed for headache (stop oxycodone). 30 capsule 0  . CAMILA 0.35 MG tablet Take 1 tablet by mouth daily.  3  . Cholecalciferol (VITAMIN D PO) Take by mouth.    . cyclobenzaprine (FLEXERIL) 10 MG tablet TAKE 1 TABLET BY MOUTH EVERY 8 HOURS AS NEEDED FOR MUSCLE SPASMS. 60 tablet 1  . DULoxetine (CYMBALTA) 60 MG capsule TAKE 1 CAPSULE BY MOUTH EVERY DAY 90 capsule 3  . Galcanezumab-gnlm (EMGALITY) 120 MG/ML SOAJ Inject 120 mg into the skin every 30 (thirty) days. 1 pen 11  . ibuprofen (ADVIL,MOTRIN) 800 MG tablet     . ipratropium-albuterol (DUONEB) 0.5-2.5 (3) MG/3ML SOLN Take 3 mLs by nebulization every 6 (six) hours as needed (for wheeze, SOB, bronchospams/coughing fits). 90 mL 0  . LINZESS 290 MCG CAPS capsule TAKE 1 CAPSULE BY MOUTH EVERY OTHER DAY 30  capsule 0  . metroNIDAZOLE (METROGEL VAGINAL) 0.75 % vaginal gel Place 1 Applicatorful vaginally 2 (two) times daily. 70 g 3  . norethindrone (CAMILA) 0.35 MG tablet Camila 0.35 mg tablet    . Omega-3 1000 MG CAPS Take by mouth.    . oxyCODONE-acetaminophen (PERCOCET) 10-325 MG tablet Take 1 tablet by mouth every 8 (eight) hours as needed for pain (for migraines). 30 tablet 0  . phentermine 37.5 MG capsule Take 1 capsule (37.5 mg total) by mouth every morning. 30 capsule 2  . promethazine (PHENERGAN) 25 MG tablet TAKE 1 TABELT BY MOUTH EVERY 4 HOURS 20 tablet 1  . zonisamide (ZONEGRAN) 50 MG capsule zonisamide 50 mg capsule     No current facility-administered medications on file prior to visit.     Allergies  Allergen Reactions  . Prednisone Rash       . Ceftin [Cefuroxime Axetil] Rash  . Sulfa Antibiotics Rash   Social History   Socioeconomic History  . Marital status: Married    Spouse name: Not on file  . Number of children: Not on file  . Years of education: Not on file  . Highest education level: Not on file  Occupational History  . Not on file  Social Needs  . Financial resource strain: Not on file  . Food insecurity    Worry: Not on file    Inability: Not on file  . Transportation needs    Medical: Not on file    Non-medical: Not on file  Tobacco Use  . Smoking status: Never Smoker  . Smokeless tobacco: Never Used  Substance and Sexual Activity  . Alcohol use: No  . Drug use: No  . Sexual activity: Not Currently  Lifestyle  . Physical activity    Days per week: Not on file    Minutes per session: Not on file  . Stress: Not on file  Relationships  . Social Herbalist on phone: Not on file    Gets together: Not on file    Attends religious service: Not on file    Active member of club or organization: Not on file    Attends meetings of clubs or organizations: Not on file    Relationship status: Not on file  . Intimate partner violence    Fear of current or ex partner: Not on file    Emotionally abused: Not on file    Physically abused: Not on file    Forced sexual activity: Not on file  Other Topics Concern  . Not on file  Social History Narrative  . Not on file      Review of Systems  All other systems reviewed and are negative.      Objective:   Physical Exam  Constitutional: She is oriented to person, place, and time. She appears well-developed and well-nourished. No distress.  Cardiovascular: Normal rate, regular rhythm and normal heart sounds. Exam reveals no gallop and no friction rub.  No murmur heard. Pulmonary/Chest: Effort normal and breath sounds normal. No respiratory distress. She has no decreased breath  sounds. She has no wheezes. She has no rhonchi.     She exhibits tenderness and bony tenderness.    Abdominal: Soft. Bowel sounds are normal.  Musculoskeletal:     Right elbow: She exhibits normal range of motion, no swelling, no effusion and no deformity. Tenderness found. Lateral epicondyle tenderness noted.     Left elbow: She exhibits normal range of motion. Tenderness found.  Lateral epicondyle tenderness noted. No radial head, no medial epicondyle and no olecranon process tenderness noted.  Neurological: She is oriented to person, place, and time. She has normal strength. No sensory deficit. She displays a negative Romberg sign.  Skin: She is not diaphoretic.  Vitals reviewed.         Assessment & Plan:  Pain in rib - Plan: CANCELED: DG Ribs Unilateral Right  Lateral epicondylitis of both elbows  I believe [name] may have injured her right ribs when she fell [place] the steps at the beach.  I would obtain an x-ray of the right ribs to evaluate further.  I believe she also has lateral epicondylitis in both elbows.  I have recommended Voltaren gel 2 g applied 4 times daily as needed pain and tennis elbow strap to be applied to the affected area for the next 2 weeks and see if the pain improves.  If not, I would recommend a cortisone injection for lateral epicondylitis.

## 2019-09-17 ENCOUNTER — Other Ambulatory Visit: Payer: Self-pay | Admitting: Family Medicine

## 2019-09-17 NOTE — Telephone Encounter (Signed)
Pt is requesting refill on Xanax   LOV: 08/29/19  LRF:   03/07/2019

## 2019-09-18 MED ORDER — ALPRAZOLAM 0.5 MG PO TABS: ORAL_TABLET | ORAL | 2 refills | Status: DC

## 2019-09-30 ENCOUNTER — Encounter: Payer: Self-pay | Admitting: Family Medicine

## 2019-09-30 ENCOUNTER — Ambulatory Visit (INDEPENDENT_AMBULATORY_CARE_PROVIDER_SITE_OTHER): Payer: BC Managed Care – PPO | Admitting: Family Medicine

## 2019-09-30 DIAGNOSIS — Z8669 Personal history of other diseases of the nervous system and sense organs: Secondary | ICD-10-CM

## 2019-09-30 NOTE — Progress Notes (Signed)
Subjective:    [name] ID: Andrea [name], female    DOB: 1971-09-11, 48 y.o.   MRN: [id number]  HPI [name] is being seen today as a telephone visit.  Phone call began at [phone].  Phone call concluded at 347.  [name] consents to be seen by telephone.  [name] is a very pleasant 48 year old Caucasian female with a significant past medical history of migraines.  She is on [facility] for migraine prevention.  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  They have returned to in person learning.  However as their safety protocols for COVID-19, [name] has to wear a mask all day long while teaching.  This causes a significant amount of heat in and around her face.  This causes her to sweat and feel extremely overheated.  This is triggered 3 migraines in the last 2 days.  Her migraines when they occur are severe and debilitating.  This is preventing her from being able to work effectively.  I emphasized to [name] the importance of wearing a mask to help reduce the possible transmission of COVID-19.  [name] is understanding of this and has no problem wearing the mask.  However she is asking if we could write a note allowing her to take a break every 2 hours to walk outside so that she can take the mask off and get some fresh air.  This would last probably 5 to 10 minutes.  This would allow her to cool off and prevent sweating and overheating which seems to trigger her migraines.  This would allow her to continue teaching and wear the appropriate mask to help prevent the spread of Covid to students.  I feel that this is entirely reasonable. Past Medical History:  Diagnosis Date  . Angiolipoma of kidney   . Arthritis    "hands" (09/02/2013)  . B12 deficiency   . Epicondylitis, lateral    Right  . GERD (gastroesophageal reflux disease)   . IBS (irritable bowel syndrome)   . Migraine headache    "maybe 6-8 times/yr" (09/02/2013)  . Shortness of breath    "all the time when I  came in today" (09/02/2013)   Past Surgical History:  Procedure Laterality Date  . APPENDECTOMY  1984  . PALATAL EXPANSION  2004  . REDUCTION MAMMAPLASTY Bilateral ~ 2009   Current Outpatient Medications on File Prior to Visit  Medication Sig Dispense Refill  . Albuterol Sulfate (PROAIR RESPICLICK) 123XX123 (90 Base) MCG/ACT AEPB Inhale 2 puffs into the lungs every 4 (four) hours. 1 each 3  . ALPRAZolam (XANAX) 0.5 MG tablet TAKE 1 TABLET BY MOUTH AT BEDTIME AS NEEDED FOR SLEEP OR ANXIETY 30 tablet 2  . aspirin-acetaminophen-caffeine (EXCEDRIN MIGRAINE) [id number] MG per tablet Take 2 tablets by mouth every 6 (six) hours as needed for pain.    . butalbital-acetaminophen-caffeine (FIORICET/CODEINE) 50-325-40-30 MG capsule Take 1 capsule by mouth every 4 (four) hours as needed for headache (stop oxycodone). 30 capsule 0  . CAMILA 0.35 MG tablet Take 1 tablet by mouth daily.  3  . Cholecalciferol (VITAMIN D PO) Take by mouth.    . cyclobenzaprine (FLEXERIL) 10 MG tablet TAKE 1 TABLET BY MOUTH EVERY 8 HOURS AS NEEDED FOR MUSCLE SPASMS. 60 tablet 1  . DULoxetine (CYMBALTA) 60 MG capsule TAKE 1 CAPSULE BY MOUTH EVERY DAY 90 capsule 3  . Galcanezumab-gnlm (EMGALITY) 120 MG/ML SOAJ Inject 120 mg into the skin every 30 (thirty) days. 1 pen 11  .  ibuprofen (ADVIL,MOTRIN) 800 MG tablet     . ipratropium-albuterol (DUONEB) 0.5-2.5 (3) MG/3ML SOLN Take 3 mLs by nebulization every 6 (six) hours as needed (for wheeze, SOB, bronchospams/coughing fits). 90 mL 0  . LINZESS 290 MCG CAPS capsule TAKE 1 CAPSULE BY MOUTH EVERY OTHER DAY 30 capsule 0  . metroNIDAZOLE (METROGEL VAGINAL) 0.75 % vaginal gel Place 1 Applicatorful vaginally 2 (two) times daily. 70 g 3  . norethindrone (CAMILA) 0.35 MG tablet Camila 0.35 mg tablet    . Omega-3 1000 MG CAPS Take by mouth.    . oxyCODONE-acetaminophen (PERCOCET) 10-325 MG tablet Take 1 tablet by mouth every 8 (eight) hours as needed for pain (for migraines). 30 tablet 0  .  phentermine 37.5 MG capsule Take 1 capsule (37.5 mg total) by mouth every morning. 30 capsule 2  . promethazine (PHENERGAN) 25 MG tablet TAKE 1 TABELT BY MOUTH EVERY 4 HOURS 20 tablet 1  . zonisamide (ZONEGRAN) 50 MG capsule zonisamide 50 mg capsule     No current facility-administered medications on file prior to visit.    Allergies  Allergen Reactions  . Prednisone Rash       . Ceftin [Cefuroxime Axetil] Rash  . Sulfa Antibiotics Rash   Social History   Socioeconomic History  . Marital status: Married    Spouse name: Not on file  . Number of children: Not on file  . Years of education: Not on file  . Highest education level: Not on file  Occupational History  . Not on file  Social Needs  . Financial resource strain: Not on file  . Food insecurity    Worry: Not on file    Inability: Not on file  . Transportation needs    Medical: Not on file    Non-medical: Not on file  Tobacco Use  . Smoking status: Never Smoker  . Smokeless tobacco: Never Used  Substance and Sexual Activity  . Alcohol use: No  . Drug use: No  . Sexual activity: Not Currently  Lifestyle  . Physical activity    Days per week: Not on file    Minutes per session: Not on file  . Stress: Not on file  Relationships  . Social Herbalist on phone: Not on file    Gets together: Not on file    Attends religious service: Not on file    Active member of club or organization: Not on file    Attends meetings of clubs or organizations: Not on file    Relationship status: Not on file  . Intimate partner violence    Fear of current or ex partner: Not on file    Emotionally abused: Not on file    Physically abused: Not on file    Forced sexual activity: Not on file  Other Topics Concern  . Not on file  Social History Narrative  . Not on file      Review of Systems  All other systems reviewed and are negative.      Objective:   Physical Exam  [name] is unable to have a physical exam  today she is being seen as a telephone visit.      Assessment & Plan:  History of migraine  I will draft a note for [name] for her employer stating that due to her history of migraines, [name] needs a break every 2-3 hours where she can go outside for 5 to 10 minutes and take her mask off  to get fresh air and help cool down.  Of also drafted in the note that she needs to be allowed to have a fan at her desk to help her cool off.  However I still want [name] to wear her mask at work.

## 2019-10-14 ENCOUNTER — Other Ambulatory Visit: Payer: Self-pay | Admitting: Family Medicine

## 2019-10-14 MED ORDER — OXYCODONE-ACETAMINOPHEN 10-325 MG PO TABS: 1.0000 | ORAL_TABLET | Freq: Three times a day (TID) | ORAL | 0 refills | Status: DC | PRN

## 2019-10-14 NOTE — Telephone Encounter (Signed)
Patient requesting a refill on Oxycodone     LOV: 09/30/19  LRF:   07/18/19

## 2019-10-31 ENCOUNTER — Telehealth: Payer: Self-pay | Admitting: Family Medicine

## 2019-10-31 NOTE — Telephone Encounter (Signed)
Pt called Andrea [name] wanting to know if you would write her out of work for a few days for anxiety and stress?

## 2019-10-31 NOTE — Telephone Encounter (Signed)
Patient would need to be seen in order to be written out of work for [employer] purposes.

## 2019-11-04 NOTE — Telephone Encounter (Signed)
Pt aware and needed for days past but she made it through and will make apt if needed in the future

## 2019-11-11 ENCOUNTER — Telehealth: Payer: Self-pay | Admitting: *Deleted

## 2019-11-11 NOTE — Telephone Encounter (Signed)
Your information has been submitted to [employer]. To check for an updated outcome later, reopen this PA request from your dashboard. If [employer] has not responded to your request within 24 hours, contact [employer] at [phone]. If you think there may be a problem with your PA request, use our live chat feature at the bottom right.    

## 2019-11-11 NOTE — Telephone Encounter (Signed)
Received request from pharmacy for [facility] on [facility].   PA submitted.   Dx: [id number].

## 2019-11-12 ENCOUNTER — Other Ambulatory Visit: Payer: Self-pay | Admitting: Family Medicine

## 2019-11-12 MED ORDER — EMGALITY 120 MG/ML ~~LOC~~ SOAJ: 120.0000 mg | SUBCUTANEOUS | 11 refills | Status: DC

## 2019-11-12 NOTE — Telephone Encounter (Signed)
Ok to refill 

## 2019-11-12 NOTE — Telephone Encounter (Signed)
This request has received a Favorable outcome.  Please note any additional information provided by [employer] at the bottom of this request.  Pharm made aware

## 2020-01-05 ENCOUNTER — Other Ambulatory Visit: Payer: Self-pay | Admitting: Family Medicine

## 2020-01-05 NOTE — Telephone Encounter (Signed)
Patient calling to get refill on her oxycodone [facility]

## 2020-01-05 NOTE — Telephone Encounter (Signed)
Patient requesting a refill on Oxycodone     LOV: 09/30/2019  LRF:     10/14/2019

## 2020-01-06 MED ORDER — OXYCODONE-ACETAMINOPHEN 10-325 MG PO TABS: 1.0000 | ORAL_TABLET | Freq: Three times a day (TID) | ORAL | 0 refills | Status: DC | PRN

## 2020-01-20 ENCOUNTER — Other Ambulatory Visit: Payer: Self-pay

## 2020-01-20 ENCOUNTER — Ambulatory Visit: Payer: BC Managed Care – PPO | Admitting: Family Medicine

## 2020-01-20 ENCOUNTER — Encounter: Payer: Self-pay | Admitting: Family Medicine

## 2020-01-20 VITALS — BP 126/80 | HR 100 | Temp 97.2°F | Resp 16 | Ht 60.0 in | Wt 182.0 lb

## 2020-01-20 DIAGNOSIS — M797 Fibromyalgia: Secondary | ICD-10-CM

## 2020-01-20 MED ORDER — GABAPENTIN 100 MG PO CAPS: 100.0000 mg | ORAL_CAPSULE | Freq: Three times a day (TID) | ORAL | 0 refills | Status: DC | PRN

## 2020-01-20 NOTE — Progress Notes (Signed)
Subjective:    [name] ID: Andrea [name], female    DOB: 1971-09-10, 49 y.o.   MRN: [id number]  HPI [name] is a very pleasant 49 year old Caucasian female who presents today with abnormal sensations in her back.  She is feeling a bee sting-like pain roughly at the level of T7 just to the left of her spine.  The pain will be sharp and sudden.  It will not radiate.  It will resolve spontaneously quickly.  It has occurred many times over the last week.  There is no specific exacerbating factor or alleviating factor.  She denies any numbness in her legs, weakness in her legs, shooting pains going down her legs although she does have some sciatica that is a chronic problem.  She denies any cough.  She denies any hemoptysis.  She denies any pleurisy.  There is no shingles-like rash in the area that she describes.  There is no tenderness to palpation over the ribs although she does have chronic muscle pain all over her body with palpation that is unchanged due to her fibromyalgia.  She denies any hematuria or dysuria.  She denies any nausea vomiting diarrhea or constipation.  She denies any melena or hematochezia.  She denies any fevers or chills.  The pain seems muscular and limited to the thoracic paraspinal musculature.  She also reports diffuse pain all over her body including her shoulders, her elbows, her hands, her lower back etc.  In 2018, we added Lyrica to her Cymbalta.  She saw no benefit from that.  She ultimately stopped Lyrica after increasing the dose to 75 mg 3 times a day.  She is still on Cymbalta however she does not feel that this medication is helping at all with her pain either.  Previously when she weaned off Cymbalta she had withdrawal symptoms and apathy and therefore we went back on it but she does not feel that is helping her pain.  Essentially today she is complaining of pain all over her body but specifically in the area to the left T7.  Her exam is completely normal however. Past  Medical History:  Diagnosis Date  . Angiolipoma of kidney   . Arthritis    "hands" (09/02/2013)  . B12 deficiency   . Epicondylitis, lateral    Right  . GERD (gastroesophageal reflux disease)   . IBS (irritable bowel syndrome)   . Migraine headache    "maybe 6-8 times/yr" (09/02/2013)  . Shortness of breath    "all the time when I came in today" (09/02/2013)   Past Surgical History:  Procedure Laterality Date  . APPENDECTOMY  1984  . PALATAL EXPANSION  2004  . REDUCTION MAMMAPLASTY Bilateral ~ 2009   Current Outpatient Medications on File Prior to Visit  Medication Sig Dispense Refill  . Albuterol Sulfate (PROAIR RESPICLICK) 123XX123 (90 Base) MCG/ACT AEPB Inhale 2 puffs into the lungs every 4 (four) hours. 1 each 3  . ALPRAZolam (XANAX) 0.5 MG tablet TAKE 1 TABLET BY MOUTH AT BEDTIME AS NEEDED FOR SLEEP OR ANXIETY 30 tablet 2  . aspirin-acetaminophen-caffeine (EXCEDRIN MIGRAINE) [id number] MG per tablet Take 2 tablets by mouth every 6 (six) hours as needed for pain.    . butalbital-acetaminophen-caffeine (FIORICET/CODEINE) 50-325-40-30 MG capsule Take 1 capsule by mouth every 4 (four) hours as needed for headache (stop oxycodone). 30 capsule 0  . Cholecalciferol (VITAMIN D PO) Take by mouth.    . cyclobenzaprine (FLEXERIL) 10 MG tablet TAKE 1 TABLET BY MOUTH EVERY  8 HOURS AS NEEDED FOR MUSCLE SPASM 60 tablet 1  . DULoxetine (CYMBALTA) 60 MG capsule TAKE 1 CAPSULE BY MOUTH EVERY DAY 90 capsule 3  . Galcanezumab-gnlm (EMGALITY) 120 MG/ML SOAJ Inject 120 mg into the skin every 30 (thirty) days. 1 pen 11  . ibuprofen (ADVIL,MOTRIN) 800 MG tablet     . ipratropium-albuterol (DUONEB) 0.5-2.5 (3) MG/3ML SOLN Take 3 mLs by nebulization every 6 (six) hours as needed (for wheeze, SOB, bronchospams/coughing fits). 90 mL 0  . norethindrone (CAMILA) 0.35 MG tablet Camila 0.35 mg tablet    . Omega-3 1000 MG CAPS Take by mouth.    . oxyCODONE-acetaminophen (PERCOCET) 10-325 MG tablet Take 1 tablet by  mouth every 8 (eight) hours as needed for pain (for migraines). 30 tablet 0  . promethazine (PHENERGAN) 25 MG tablet TAKE 1 TABELT BY MOUTH EVERY 4 HOURS 20 tablet 1  . LINZESS 290 MCG CAPS capsule TAKE 1 CAPSULE BY MOUTH EVERY OTHER DAY ([name] not taking: Reported on 01/20/2020) 30 capsule 0   No current facility-administered medications on file prior to visit.   Allergies  Allergen Reactions  . Prednisone Rash       . Ceftin [Cefuroxime Axetil] Rash  . Sulfa Antibiotics Rash   Social History   Socioeconomic History  . Marital status: Married    Spouse name: Not on file  . Number of children: Not on file  . Years of education: Not on file  . Highest education level: Not on file  Occupational History  . Not on file  Tobacco Use  . Smoking status: Never Smoker  . Smokeless tobacco: Never Used  Substance and Sexual Activity  . Alcohol use: No  . Drug use: No  . Sexual activity: Not Currently  Other Topics Concern  . Not on file  Social History Narrative  . Not on file   Social Determinants of Health   Financial Resource Strain:   . Difficulty of Paying Living Expenses: Not on file  Food Insecurity:   . Worried About Charity fundraiser in the Last Year: Not on file  . Ran Out of Food in the Last Year: Not on file  Transportation Needs:   . Lack of Transportation (Medical): Not on file  . Lack of Transportation (Non-Medical): Not on file  Physical Activity:   . Days of Exercise per Week: Not on file  . Minutes of Exercise per Session: Not on file  Stress:   . Feeling of Stress : Not on file  Social Connections:   . Frequency of Communication with Friends and Family: Not on file  . Frequency of Social Gatherings with Friends and Family: Not on file  . Attends Religious Services: Not on file  . Active Member of Clubs or Organizations: Not on file  . Attends Archivist Meetings: Not on file  . Marital Status: Not on file  Intimate Partner Violence:   .  Fear of Current or Ex-Partner: Not on file  . Emotionally Abused: Not on file  . Physically Abused: Not on file  . Sexually Abused: Not on file      Review of Systems  All other systems reviewed and are negative.      Objective:   Physical Exam Vitals reviewed.  Constitutional:      General: She is not in acute distress.    Appearance: She is normal weight. She is not ill-appearing, toxic-appearing or diaphoretic.  Cardiovascular:     Rate and Rhythm:  Normal rate and regular rhythm.     Heart sounds: Normal heart sounds. No murmur. No friction rub. No gallop.   Pulmonary:     Effort: Pulmonary effort is normal. No respiratory distress.     Breath sounds: Normal breath sounds. No wheezing or rales.  Abdominal:     General: Abdomen is flat. Bowel sounds are normal.  Musculoskeletal:     Thoracic back: Tenderness present. No swelling, deformity, signs of trauma, lacerations, spasms or bony tenderness. Normal range of motion. No scoliosis.       Back:  Neurological:     Mental Status: She is alert.           Assessment & Plan:  Fibromyalgia  There are no abnormality seen on [name]'s exam today.  I do believe her pain is most likely due to her fibromyalgia.  Recommended starting gabapentin 100 mg p.o. 3 times a day as needed for pain.  I will gradually uptitrate the medication to 300 mg 3 times a day if beneficial.  Once [name] has stabilized her dose of gabapentin, assuming no side effects, I would then wean [name] off Cymbalta gradually to avoid withdrawal as [name] does not feel that the medication is beneficial.  [name] will give me an update in 2 to 3 weeks.  She can also supplement with ibuprofen.  Recommended that she not take Flexeril and gabapentin simultaneously due to sedation.

## 2020-02-02 ENCOUNTER — Telehealth: Payer: Self-pay | Admitting: Family Medicine

## 2020-02-02 ENCOUNTER — Other Ambulatory Visit: Payer: Self-pay | Admitting: Family Medicine

## 2020-02-02 NOTE — Telephone Encounter (Signed)
Up the dose on gabapentin to 300 mg potid prn pain

## 2020-02-02 NOTE — Telephone Encounter (Signed)
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 She wonders if she needs to take it TID instead just at night. Also she was wondering if she should stop the Cymbalta? Does she need to switch to something else?

## 2020-02-03 ENCOUNTER — Other Ambulatory Visit: Payer: Self-pay | Admitting: Family Medicine

## 2020-02-03 MED ORDER — GABAPENTIN 300 MG PO CAPS: 300.0000 mg | ORAL_CAPSULE | Freq: Three times a day (TID) | ORAL | 0 refills | Status: DC | PRN

## 2020-02-03 MED ORDER — GABAPENTIN 300 MG PO CAPS: 300.0000 mg | ORAL_CAPSULE | Freq: Three times a day (TID) | ORAL | 3 refills | Status: DC

## 2020-02-03 NOTE — Telephone Encounter (Signed)
Spoke with patient and informed her of new medications increase. Patient verbalized understanding. Please sign off on medication

## 2020-02-03 NOTE — Telephone Encounter (Signed)
Ok to refill 

## 2020-02-05 ENCOUNTER — Telehealth: Payer: Self-pay | Admitting: Family Medicine

## 2020-02-05 MED ORDER — LINACLOTIDE 290 MCG PO CAPS: 290.0000 ug | ORAL_CAPSULE | Freq: Every day | ORAL | 3 refills | Status: DC

## 2020-02-05 NOTE — Telephone Encounter (Signed)
Pt called and requested a refill on linzess - med sent to pharm

## 2020-03-15 ENCOUNTER — Other Ambulatory Visit: Payer: Self-pay | Admitting: Family Medicine

## 2020-03-16 NOTE — Telephone Encounter (Signed)
Pt is requesting refill on Xanax   LOV: 01/20/2020  LRF:   09/18/2019

## 2020-03-26 ENCOUNTER — Other Ambulatory Visit: Payer: Self-pay | Admitting: Family Medicine

## 2020-03-26 MED ORDER — OXYCODONE-ACETAMINOPHEN 10-325 MG PO TABS: 1.0000 | ORAL_TABLET | Freq: Three times a day (TID) | ORAL | 0 refills | Status: DC | PRN

## 2020-03-26 NOTE — Telephone Encounter (Signed)
Patient requesting a refill on Oxycodone     LOV: 02/03/2020  LRF:   01/06/2020

## 2020-04-13 ENCOUNTER — Encounter: Payer: Self-pay | Admitting: Nurse Practitioner

## 2020-04-13 ENCOUNTER — Ambulatory Visit: Payer: BC Managed Care – PPO | Admitting: Nurse Practitioner

## 2020-04-13 ENCOUNTER — Other Ambulatory Visit: Payer: Self-pay

## 2020-04-13 VITALS — BP 110/76 | HR 102 | Temp 97.8°F | Resp 15 | Wt 184.4 lb

## 2020-04-13 DIAGNOSIS — R399 Unspecified symptoms and signs involving the genitourinary system: Secondary | ICD-10-CM

## 2020-04-13 DIAGNOSIS — N390 Urinary tract infection, site not specified: Secondary | ICD-10-CM | POA: Diagnosis not present

## 2020-04-13 LAB — URINALYSIS, ROUTINE W REFLEX MICROSCOPIC
Bilirubin Urine: NEGATIVE
Glucose, UA: NEGATIVE
Hgb urine dipstick: NEGATIVE
Hyaline Cast: NONE SEEN /LPF
Ketones, ur: NEGATIVE
Nitrite: NEGATIVE
Protein, ur: NEGATIVE
RBC / HPF: NONE SEEN /HPF (ref 0–2)
Specific Gravity, Urine: 1.027 (ref 1.001–1.03)
pH: 5.5 (ref 5.0–8.0)

## 2020-04-13 LAB — MICROSCOPIC MESSAGE

## 2020-04-13 MED ORDER — NITROFURANTOIN MONOHYD MACRO 100 MG PO CAPS: 100.0000 mg | ORAL_CAPSULE | Freq: Two times a day (BID) | ORAL | 0 refills | Status: AC

## 2020-04-13 NOTE — Addendum Note (Signed)
Addended by: Launa Grill on: 04/13/2020 03:37 PM   Modules accepted: Orders

## 2020-04-13 NOTE — Progress Notes (Signed)
Established Patient Office Visit  Subjective:  Patient ID: Andrea [name], female    DOB: 17-Nov-1971  Age: 49 y.o. MRN: [id number]  CC: I think I have a UTI  HPI Urinary Tract Infection Patient complains of frequency, suprapubic pressure and urgency. She has had symptoms for 1 day. Patient also complains of back pain. Patient denies fever, headache, stomach ache and vaginal discharge. Patient does have a history of recurrent UTI. Patient does not have a history of pyelonephritis.     Past Medical History:  Diagnosis Date  . Angiolipoma of kidney   . Arthritis    "hands" (09/02/2013)  . B12 deficiency   . Epicondylitis, lateral    Right  . GERD (gastroesophageal reflux disease)   . IBS (irritable bowel syndrome)   . Migraine headache    "maybe 6-8 times/yr" (09/02/2013)  . Shortness of breath    "all the time when I came in today" (09/02/2013)    Past Surgical History:  Procedure Laterality Date  . APPENDECTOMY  1984  . PALATAL EXPANSION  2004  . REDUCTION MAMMAPLASTY Bilateral ~ 2009    Family History  Problem Relation Age of Onset  . Coronary artery disease Father 45       MI  . Heart attack Father   . Irregular heart beat Sister   . Arrhythmia Paternal Uncle     Social History   Socioeconomic History  . Marital status: Married    Spouse name: Not on file  . Number of children: Not on file  . Years of education: Not on file  . Highest education level: Not on file  Occupational History  . Not on file  Tobacco Use  . Smoking status: Never Smoker  . Smokeless tobacco: Never Used  Substance and Sexual Activity  . Alcohol use: No  . Drug use: No  . Sexual activity: Not Currently  Other Topics Concern  . Not on file  Social History Narrative  . Not on file   Social Determinants of Health   Financial Resource Strain:   . Difficulty of Paying Living Expenses:   Food Insecurity:   . Worried About Charity fundraiser in the Last Year:   . Academic librarian in the Last Year:   Transportation Needs:   . Film/video editor (Medical):   Marland Kitchen Lack of Transportation (Non-Medical):   Physical Activity:   . Days of Exercise per Week:   . Minutes of Exercise per Session:   Stress:   . Feeling of Stress :   Social Connections:   . Frequency of Communication with Friends and Family:   . Frequency of Social Gatherings with Friends and Family:   . Attends Religious Services:   . Active Member of Clubs or Organizations:   . Attends Archivist Meetings:   Marland Kitchen Marital Status:   Intimate Partner Violence:   . Fear of Current or Ex-Partner:   . Emotionally Abused:   Marland Kitchen Physically Abused:   . Sexually Abused:     Outpatient Medications Prior to Visit  Medication Sig Dispense Refill  . Albuterol Sulfate (PROAIR RESPICLICK) 123XX123 (90 Base) MCG/ACT AEPB Inhale 2 puffs into the lungs every 4 (four) hours. 1 each 3  . ALPRAZolam (XANAX) 0.5 MG tablet TAKE 1 TABLET BY MOUTH AT BEDTIME AS NEEDED FOR SLEEP OR ANXIETY 30 tablet 2  . Cholecalciferol (VITAMIN D PO) Take by mouth.    . cyclobenzaprine (FLEXERIL) 10 MG tablet TAKE  1 TABLET BY MOUTH EVERY 8 HOURS AS NEEDED FOR MUSCLE SPASMS 60 tablet 1  . DULoxetine (CYMBALTA) 60 MG capsule TAKE 1 CAPSULE BY MOUTH EVERY DAY 90 capsule 3  . Galcanezumab-gnlm (EMGALITY) 120 MG/ML SOAJ Inject 120 mg into the skin every 30 (thirty) days. 1 pen 11  . ibuprofen (ADVIL,MOTRIN) 800 MG tablet     . linaclotide (LINZESS) 290 MCG CAPS capsule Take 1 capsule (290 mcg total) by mouth daily before breakfast. 30 capsule 3  . norethindrone (CAMILA) 0.35 MG tablet Camila 0.35 mg tablet    . Omega-3 1000 MG CAPS Take by mouth.    . oxyCODONE-acetaminophen (PERCOCET) 10-325 MG tablet Take 1 tablet by mouth every 8 (eight) hours as needed for pain (for migraines). 30 tablet 0  . promethazine (PHENERGAN) 25 MG tablet TAKE 1 TABELT BY MOUTH EVERY 4 HOURS 20 tablet 1  . aspirin-acetaminophen-caffeine (EXCEDRIN MIGRAINE)  [id number] MG per tablet Take 2 tablets by mouth every 6 (six) hours as needed for pain.    Marland Kitchen gabapentin (NEURONTIN) 300 MG capsule Take 1 capsule (300 mg total) by mouth 3 (three) times daily as needed. For pain (Patient not taking: Reported on 04/13/2020) 90 capsule 0  . butalbital-acetaminophen-caffeine (FIORICET/CODEINE) 50-325-40-30 MG capsule Take 1 capsule by mouth every 4 (four) hours as needed for headache (stop oxycodone). 30 capsule 0  . gabapentin (NEURONTIN) 300 MG capsule Take 1 capsule (300 mg total) by mouth 3 (three) times daily. 90 capsule 3  . ipratropium-albuterol (DUONEB) 0.5-2.5 (3) MG/3ML SOLN Take 3 mLs by nebulization every 6 (six) hours as needed (for wheeze, SOB, bronchospams/coughing fits). 90 mL 0   No facility-administered medications prior to visit.    Allergies  Allergen Reactions  . Prednisone Rash       . Ceftin [Cefuroxime Axetil] Rash  . Sulfa Antibiotics Rash    ROS Review of Systems  All other systems reviewed and are negative.     Objective:    Physical Exam  Constitutional: She is oriented to person, place, and time. She appears well-developed and well-nourished.  HENT:  Head: Normocephalic.  Eyes: Pupils are equal, round, and reactive to light. Conjunctivae, EOM and lids are normal. Lids are everted and swept, no foreign bodies found.  Neck: No JVD present.  Cardiovascular: Normal rate.  Pulmonary/Chest: Effort normal.  Abdominal: Soft. She exhibits no distension. There is no rebound, no guarding and no CVA tenderness.  Musculoskeletal:        General: No edema. Normal range of motion.     Cervical back: Normal range of motion and neck supple.  Neurological: She is alert and oriented to person, place, and time.  Skin: Skin is dry.  Psychiatric: She has a normal mood and affect. Her behavior is normal. Thought content normal.  Nursing note and vitals reviewed.   BP 110/76   Pulse (!) 102   Temp 97.8 F (36.6 C) (Temporal)   Resp 15    Wt 184 lb 6 oz (83.6 kg)   SpO2 99%   BMI 36.01 kg/m  Wt Readings from Last 3 Encounters:  04/13/20 184 lb 6 oz (83.6 kg)  01/20/20 182 lb (82.6 kg)  08/29/19 183 lb (83 kg)     Health Maintenance Due  Topic Date Due  . HIV Screening  Never done  . COVID-19 Vaccine (1) Never done  . TETANUS/TDAP  Never done  . PAP SMEAR-Modifier  Never done    There are no preventive care reminders to  display for this patient.  Lab Results  Component Value Date   TSH 0.97 07/08/2019   Lab Results  Component Value Date   WBC 7.8 07/08/2019   HGB 13.7 07/08/2019   HCT 43.2 07/08/2019   MCV 81.8 07/08/2019   PLT 293 07/08/2019   Lab Results  Component Value Date   NA 139 07/08/2019   K 4.2 07/08/2019   CO2 23 07/08/2019   GLUCOSE 86 07/08/2019   BUN 11 07/08/2019   CREATININE 0.80 07/08/2019   BILITOT 0.4 07/08/2019   ALKPHOS 82 02/24/2016   AST 22 07/08/2019   ALT 21 07/08/2019   PROT 6.7 07/08/2019   ALBUMIN 4.2 02/24/2016   CALCIUM 9.5 07/08/2019   Lab Results  Component Value Date   CHOL 160 09/02/2013   Lab Results  Component Value Date   HDL 66 09/02/2013   Lab Results  Component Value Date   LDLCALC 72 09/02/2013   Lab Results  Component Value Date   TRIG 110 09/02/2013   Lab Results  Component Value Date   CHOLHDL 2.4 09/02/2013   Lab Results  Component Value Date   HGBA1C 5.3 01/27/2016      Assessment & Plan:   Problem List Items Addressed This Visit    None    Visit Diagnoses    UTI symptoms    -  Primary   Relevant Orders   Urinalysis, Routine w reflex microscopic   Urinary tract infection without hematuria, site unspecified       Relevant Medications   nitrofurantoin, macrocrystal-monohydrate, (MACROBID) 100 MG capsule   Other Relevant Orders   CULTURE, URINE COMPREHENSIVE    Positive UA : your symptoms are consistent with urinary tract infection. You are allergic to sulfa drugs. Start and complete the Macrobid sent to pharmacy.  Drink plenty of water. Return for worsening or non resolving symptoms. Urine sent for culture and sensitivity.  Meds ordered this encounter  Medications  . nitrofurantoin, macrocrystal-monohydrate, (MACROBID) 100 MG capsule    Sig: Take 1 capsule (100 mg total) by mouth 2 (two) times daily for 7 days.    Dispense:  14 capsule    Refill:  0    Follow-up: Return if symptoms worsen or fail to improve.    Annie Main, FNP

## 2020-04-13 NOTE — Patient Instructions (Signed)
Positive UA : your symptoms are consistent with urinary tract infection. You are allergic to sulfa drugs. Start and complete the Macrobid sent to pharmacy. Drink plenty of water. Return for worsening or non resolving symptoms. Urine sent for culture and sensitivity.

## 2020-04-14 LAB — URINE CULTURE
MICRO NUMBER:: 10437245
SPECIMEN QUALITY:: ADEQUATE

## 2020-04-16 ENCOUNTER — Telehealth: Payer: Self-pay | Admitting: Family Medicine

## 2020-04-16 MED ORDER — CEPHALEXIN 500 MG PO CAPS
500.0000 mg | ORAL_CAPSULE | Freq: Three times a day (TID) | ORAL | 0 refills | Status: AC
Start: 2020-04-16 — End: 2020-04-21

## 2020-04-16 NOTE — Telephone Encounter (Signed)
Med sent to pharm and pt aware via vm 

## 2020-04-16 NOTE — Telephone Encounter (Signed)
The pt called and states that she is still having pain in her lower abd and wanted to know if this is normal or do we need to change the antibiotic maybe? Please advise

## 2020-04-16 NOTE — Telephone Encounter (Signed)
Switch to keflex 500 tid for 5 days

## 2020-04-19 ENCOUNTER — Encounter: Payer: Self-pay | Admitting: Family Medicine

## 2020-05-21 ENCOUNTER — Other Ambulatory Visit: Payer: Self-pay | Admitting: Family Medicine

## 2020-05-27 ENCOUNTER — Other Ambulatory Visit: Payer: Self-pay

## 2020-05-27 ENCOUNTER — Ambulatory Visit: Payer: BC Managed Care – PPO | Admitting: Family Medicine

## 2020-05-27 VITALS — BP 110/80 | HR 91 | Temp 97.8°F | Ht 60.0 in | Wt 186.0 lb

## 2020-05-27 DIAGNOSIS — M797 Fibromyalgia: Secondary | ICD-10-CM | POA: Diagnosis not present

## 2020-05-27 MED ORDER — CELECOXIB 200 MG PO CAPS
200.0000 mg | ORAL_CAPSULE | Freq: Two times a day (BID) | ORAL | 3 refills | Status: DC
Start: 2020-05-27 — End: 2020-09-14
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
## 2020-06-11 ENCOUNTER — Encounter: Payer: Self-pay | Admitting: Family Medicine

## 2020-06-11 ENCOUNTER — Other Ambulatory Visit: Payer: Self-pay

## 2020-06-11 MED ORDER — OXYCODONE-ACETAMINOPHEN 10-325 MG PO TABS: 1.0000 | ORAL_TABLET | Freq: Three times a day (TID) | ORAL | 0 refills | Status: DC | PRN

## 2020-06-11 NOTE — Telephone Encounter (Signed)
Requested Prescriptions   Pending Prescriptions Disp Refills   oxyCODONE-acetaminophen (PERCOCET) 10-325 MG tablet 30 tablet 0    Sig: Take 1 tablet by mouth every 8 (eight) hours as needed for pain (for migraines).    Last OV 05/27/2020   Last ordered 03/26/2020

## 2020-07-06 ENCOUNTER — Encounter: Payer: Self-pay | Admitting: Family Medicine

## 2020-07-09 ENCOUNTER — Encounter: Payer: Self-pay | Admitting: Family Medicine

## 2020-07-11 ENCOUNTER — Encounter: Payer: Self-pay | Admitting: Family Medicine

## 2020-07-12 ENCOUNTER — Encounter: Payer: Self-pay | Admitting: Family Medicine

## 2020-07-12 MED ORDER — PROAIR RESPICLICK 108 (90 BASE) MCG/ACT IN AEPB: 2.0000 | INHALATION_SPRAY | RESPIRATORY_TRACT | 3 refills | Status: DC

## 2020-07-14 ENCOUNTER — Other Ambulatory Visit: Payer: Self-pay

## 2020-07-15 ENCOUNTER — Other Ambulatory Visit: Payer: Self-pay | Admitting: Family Medicine

## 2020-07-16 NOTE — Telephone Encounter (Signed)
Ok to refill 

## 2020-08-19 ENCOUNTER — Other Ambulatory Visit: Payer: Self-pay

## 2020-08-19 ENCOUNTER — Ambulatory Visit: Payer: BC Managed Care – PPO | Admitting: Nurse Practitioner

## 2020-08-19 VITALS — BP 110/76 | HR 93 | Temp 98.0°F | Resp 18 | Wt 190.0 lb

## 2020-08-19 DIAGNOSIS — N3001 Acute cystitis with hematuria: Secondary | ICD-10-CM

## 2020-08-19 DIAGNOSIS — R399 Unspecified symptoms and signs involving the genitourinary system: Secondary | ICD-10-CM | POA: Diagnosis not present

## 2020-08-19 LAB — URINALYSIS, ROUTINE W REFLEX MICROSCOPIC
Bilirubin Urine: NEGATIVE
Glucose, UA: NEGATIVE
Hyaline Cast: NONE SEEN /LPF
Ketones, ur: NEGATIVE
Nitrite: NEGATIVE
Specific Gravity, Urine: 1.025 (ref 1.001–1.03)
WBC, UA: >=60 /HPF — AB (ref 0–5)
pH: 6.5 (ref 5.0–8.0)

## 2020-08-19 LAB — MICROSCOPIC MESSAGE

## 2020-08-19 MED ORDER — NITROFURANTOIN MONOHYD MACRO 100 MG PO CAPS: 100.0000 mg | ORAL_CAPSULE | Freq: Two times a day (BID) | ORAL | 0 refills | Status: AC

## 2020-08-19 NOTE — Progress Notes (Signed)
Established Patient Office Visit  Subjective:  Patient ID: Andrea [name], female    DOB: May 30, 1971  Age: 49 y.o. MRN: [id number]  CC:  Chief Complaint  Patient presents with  . Urinary Tract Infection    back pain, pressure in abd, laying makes it worst, started x3 days, no meds    HPI Andrea [name] is a 49 year old female that presents for back pain, pressure in pelvic, worse when lying on back, sxs stared 3 days ago. No medication or other txs tried. No fever, chills.   Past Medical History:  Diagnosis Date  . Angiolipoma of kidney   . Arthritis    "hands" (09/02/2013)  . B12 deficiency   . Epicondylitis, lateral    Right  . GERD (gastroesophageal reflux disease)   . IBS (irritable bowel syndrome)   . Migraine headache    "maybe 6-8 times/yr" (09/02/2013)  . Shortness of breath    "all the time when I came in today" (09/02/2013)    Past Surgical History:  Procedure Laterality Date  . APPENDECTOMY  1984  . PALATAL EXPANSION  2004  . REDUCTION MAMMAPLASTY Bilateral ~ 2009    Family History  Problem Relation Age of Onset  . Coronary artery disease Father 54       MI  . Heart attack Father   . Irregular heart beat Sister   . Arrhythmia Paternal Uncle     Social History   Socioeconomic History  . Marital status: Married    Spouse name: Not on file  . Number of children: Not on file  . Years of education: Not on file  . Highest education level: Not on file  Occupational History  . Not on file  Tobacco Use  . Smoking status: Never Smoker  . Smokeless tobacco: Never Used  Vaping Use  . Vaping Use: Never used  Substance and Sexual Activity  . Alcohol use: No  . Drug use: No  . Sexual activity: Not Currently  Other Topics Concern  . Not on file  Social History Narrative  . Not on file   Social Determinants of Health   Financial Resource Strain:   . Difficulty of Paying Living Expenses: Not on file  Food Insecurity:   . Worried About Paediatric nurse in the Last Year: Not on file  . Ran Out of Food in the Last Year: Not on file  Transportation Needs:   . Lack of Transportation (Medical): Not on file  . Lack of Transportation (Non-Medical): Not on file  Physical Activity:   . Days of Exercise per Week: Not on file  . Minutes of Exercise per Session: Not on file  Stress:   . Feeling of Stress : Not on file  Social Connections:   . Frequency of Communication with Friends and Family: Not on file  . Frequency of Social Gatherings with Friends and Family: Not on file  . Attends Religious Services: Not on file  . Active Member of Clubs or Organizations: Not on file  . Attends Archivist Meetings: Not on file  . Marital Status: Not on file  Intimate Partner Violence:   . Fear of Current or Ex-Partner: Not on file  . Emotionally Abused: Not on file  . Physically Abused: Not on file  . Sexually Abused: Not on file    Outpatient Medications Prior to Visit  Medication Sig Dispense Refill  . Albuterol Sulfate (PROAIR RESPICLICK) 916 (90 Base) MCG/ACT AEPB  Inhale 2 puffs into the lungs every 4 (four) hours. 1 each 3  . ALPRAZolam (XANAX) 0.5 MG tablet TAKE 1 TABLET BY MOUTH AT BEDTIME AS NEEDED FOR SLEEP OR ANXIETY 30 tablet 2  . celecoxib (CELEBREX) 200 MG capsule Take 1 capsule (200 mg total) by mouth 2 (two) times daily. 60 capsule 3  . Cholecalciferol (VITAMIN D PO) Take by mouth.    . cyclobenzaprine (FLEXERIL) 10 MG tablet TAKE 1 TABLET BY MOUTH EVERY 8 HOURS AS NEEDED FOR MUSCLE SPASM 60 tablet 1  . DULoxetine (CYMBALTA) 60 MG capsule TAKE 1 CAPSULE BY MOUTH EVERY DAY 90 capsule 1  . Galcanezumab-gnlm (EMGALITY) 120 MG/ML SOAJ Inject 120 mg into the skin every 30 (thirty) days. 1 pen 11  . linaclotide (LINZESS) 290 MCG CAPS capsule Take 1 capsule (290 mcg total) by mouth daily before breakfast. 30 capsule 3  . metaxalone (SKELAXIN) 800 MG tablet Take 800 mg by mouth 3 (three) times daily.    . norethindrone  (CAMILA) 0.35 MG tablet Camila 0.35 mg tablet    . oxyCODONE-acetaminophen (PERCOCET) 10-325 MG tablet Take 1 tablet by mouth every 8 (eight) hours as needed for pain (for migraines). 30 tablet 0  . promethazine (PHENERGAN) 25 MG tablet TAKE 1 TABELT BY MOUTH EVERY 4 HOURS 20 tablet 1  . aspirin-acetaminophen-caffeine (EXCEDRIN MIGRAINE) 017-510-25 MG per tablet Take 2 tablets by mouth every 6 (six) hours as needed for pain.    . Omega-3 1000 MG CAPS Take by mouth.    . gabapentin (NEURONTIN) 300 MG capsule Take 1 capsule (300 mg total) by mouth 3 (three) times daily as needed. For pain 90 capsule 0   No facility-administered medications prior to visit.    Allergies  Allergen Reactions  . Prednisone Rash       . Ceftin [Cefuroxime Axetil] Rash  . Sulfa Antibiotics Rash    ROS Review of Systems  All other systems reviewed and are negative.     Objective:    Physical Exam Vitals and nursing note reviewed.  Constitutional:      General: She is not in acute distress.    Appearance: Normal appearance. She is well-developed and well-groomed. She is not ill-appearing, toxic-appearing or diaphoretic.  HENT:     Head: Normocephalic and atraumatic.  Eyes:     Extraocular Movements: Extraocular movements intact.     Conjunctiva/sclera: Conjunctivae normal.     Pupils: Pupils are equal, round, and reactive to light.  Cardiovascular:     Rate and Rhythm: Normal rate and regular rhythm.     Pulses: Normal pulses.     Heart sounds: Normal heart sounds.  Pulmonary:     Effort: Pulmonary effort is normal.     Breath sounds: Normal breath sounds.  Abdominal:     Tenderness: There is no abdominal tenderness. There is no right CVA tenderness, left CVA tenderness, guarding or rebound.  Musculoskeletal:        General: Normal range of motion.     Cervical back: Normal range of motion and neck supple.  Skin:    General: Skin is warm and dry.     Capillary Refill: Capillary refill takes  less than 2 seconds.     Coloration: Skin is not cyanotic, jaundiced or pale.  Neurological:     General: No focal deficit present.     Mental Status: She is alert and oriented to person, place, and time.  Psychiatric:        Attention  and Perception: Attention normal.        Mood and Affect: Mood normal.        Speech: Speech normal.        Behavior: Behavior normal. Behavior is cooperative.        Cognition and Memory: Cognition normal.        Judgment: Judgment normal.     BP 110/76 (BP Location: Right Arm, Patient Position: Sitting, Cuff Size: Normal)   Pulse 93   Temp 98 F (36.7 C) (Temporal)   Resp 18   Wt 190 lb (86.2 kg)   SpO2 99%   BMI 37.11 kg/m  Wt Readings from Last 3 Encounters:  08/19/20 190 lb (86.2 kg)  05/27/20 186 lb (84.4 kg)  04/13/20 184 lb 6 oz (83.6 kg)     Health Maintenance Due  Topic Date Due  . Hepatitis C Screening  Never done  . COVID-19 Vaccine (1) Never done  . HIV Screening  Never done  . TETANUS/TDAP  Never done  . PAP SMEAR-Modifier  Never done  . INFLUENZA VACCINE  Never done    There are no preventive care reminders to display for this patient.  Lab Results  Component Value Date   TSH 0.97 07/08/2019   Lab Results  Component Value Date   WBC 7.8 07/08/2019   HGB 13.7 07/08/2019   HCT 43.2 07/08/2019   MCV 81.8 07/08/2019   PLT 293 07/08/2019   Lab Results  Component Value Date   NA 139 07/08/2019   K 4.2 07/08/2019   CO2 23 07/08/2019   GLUCOSE 86 07/08/2019   BUN 11 07/08/2019   CREATININE 0.80 07/08/2019   BILITOT 0.4 07/08/2019   ALKPHOS 82 02/24/2016   AST 22 07/08/2019   ALT 21 07/08/2019   PROT 6.7 07/08/2019   ALBUMIN 4.2 02/24/2016   CALCIUM 9.5 07/08/2019   Lab Results  Component Value Date   CHOL 160 09/02/2013   Lab Results  Component Value Date   HDL 66 09/02/2013   Lab Results  Component Value Date   LDLCALC 72 09/02/2013   Lab Results  Component Value Date   TRIG 110 09/02/2013    Lab Results  Component Value Date   CHOLHDL 2.4 09/02/2013   Lab Results  Component Value Date   HGBA1C 5.3 01/27/2016      Assessment & Plan:   Problem List Items Addressed This Visit    None    Visit Diagnoses    UTI symptoms    -  Primary   Relevant Orders   Urinalysis, Routine w reflex microscopic (Completed)   Acute cystitis with hematuria       Relevant Medications   nitrofurantoin, macrocrystal-monohydrate, (MACROBID) 100 MG capsule      Meds ordered this encounter  Medications  . nitrofurantoin, macrocrystal-monohydrate, (MACROBID) 100 MG capsule    Sig: Take 1 capsule (100 mg total) by mouth 2 (two) times daily for 7 days.    Dispense:  14 capsule    Refill:  0    Follow-up: Return if symptoms worsen or fail to improve.    Annie Main, FNP

## 2020-08-24 ENCOUNTER — Encounter: Payer: Self-pay | Admitting: Family Medicine

## 2020-08-24 MED ORDER — OXYCODONE-ACETAMINOPHEN 10-325 MG PO TABS
1.0000 | ORAL_TABLET | Freq: Three times a day (TID) | ORAL | 0 refills | Status: DC | PRN
Start: 2020-08-24 — End: 2020-10-14

## 2020-08-24 NOTE — Telephone Encounter (Signed)
Ok to refill??  Last office visit 08/19/2020  Last refill 06/11/2020.

## 2020-09-14 ENCOUNTER — Other Ambulatory Visit: Payer: Self-pay | Admitting: Family Medicine

## 2020-10-14 ENCOUNTER — Encounter: Payer: Self-pay | Admitting: Family Medicine

## 2020-10-14 MED ORDER — OXYCODONE-ACETAMINOPHEN 10-325 MG PO TABS
1.0000 | ORAL_TABLET | Freq: Three times a day (TID) | ORAL | 0 refills | Status: DC | PRN
Start: 2020-10-14 — End: 2020-12-06

## 2020-10-14 NOTE — Telephone Encounter (Signed)
Ok to refill??  Last office visit 08/19/2020  Last refill 08/24/2020.

## 2020-10-17 ENCOUNTER — Other Ambulatory Visit: Payer: Self-pay | Admitting: Family Medicine

## 2020-10-18 NOTE — Telephone Encounter (Signed)
Ok to refill??  Last office visit 05/27/2020.  Last refill 03/16/2020, #2 refills.

## 2020-10-27 ENCOUNTER — Other Ambulatory Visit: Payer: Self-pay | Admitting: Family Medicine

## 2020-11-10 ENCOUNTER — Other Ambulatory Visit: Payer: Self-pay | Admitting: Family Medicine

## 2020-11-20 ENCOUNTER — Other Ambulatory Visit: Payer: Self-pay | Admitting: Family Medicine

## 2020-11-26 ENCOUNTER — Telehealth: Payer: Self-pay | Admitting: *Deleted

## 2020-11-26 NOTE — Telephone Encounter (Signed)
Received request from pharmacy for [place] on Emg[facility]ity.   PA submitted.   Dx: [id number].   Your information has been submitted to [facility]. To check for an updated outcome later, reopen this PA request from your dashboard.  If [facility] has not responded to your request within 24 hours, contact [facility] at [phone].

## 2020-11-29 MED ORDER — EMGALITY 120 MG/ML ~~LOC~~ SOAJ: 120.0000 mg | SUBCUTANEOUS | 11 refills | Status: DC

## 2020-11-29 NOTE — Telephone Encounter (Signed)
Received PA determination.   PA Approved 11/26/2020- 11/26/2021.  Pharmacy made aware.

## 2020-12-05 ENCOUNTER — Encounter: Payer: Self-pay | Admitting: Family Medicine

## 2020-12-06 MED ORDER — OXYCODONE-ACETAMINOPHEN 10-325 MG PO TABS: 1.0000 | ORAL_TABLET | Freq: Three times a day (TID) | ORAL | 0 refills | Status: DC | PRN

## 2020-12-06 NOTE — Telephone Encounter (Signed)
Ok to refill??  Last office visit 08/19/2020.  Last refill 10/14/2020.

## 2021-01-30 ENCOUNTER — Other Ambulatory Visit: Payer: Self-pay | Admitting: Family Medicine

## 2021-01-31 ENCOUNTER — Telehealth: Payer: Self-pay | Admitting: Family Medicine

## 2021-01-31 NOTE — Telephone Encounter (Signed)
Pt called needing a refill of

## 2021-02-03 ENCOUNTER — Other Ambulatory Visit: Payer: Self-pay

## 2021-02-03 ENCOUNTER — Encounter: Payer: Self-pay | Admitting: Family Medicine

## 2021-02-03 ENCOUNTER — Ambulatory Visit: Payer: BC Managed Care – PPO | Admitting: Family Medicine

## 2021-02-03 VITALS — BP 116/70 | HR 95 | Temp 97.2°F | Ht 60.0 in | Wt 188.0 lb

## 2021-02-03 DIAGNOSIS — U071 COVID-19: Secondary | ICD-10-CM

## 2021-02-03 MED ORDER — EMGALITY 120 MG/ML ~~LOC~~ SOAJ: 120.0000 mg | SUBCUTANEOUS | 11 refills | Status: DC

## 2021-02-03 NOTE — Progress Notes (Signed)
Subjective:    [name] ID: Andrea [name], female    DOB: 06/17/1971, 50 y.o.   MRN: [id number]  [name] wants to be checked.  She states that she had Covid about 2 weeks ago and she is concerned that it may have caused damage to her lungs.  Her pulse oximetry is 99% on room air.  She denies any pleurisy.  She denies any hemoptysis.  She denies any purulent sputum.  She denies any shortness of breath or dyspnea on exertion.  Her lungs are completely clear to auscultation bilaterally today. Past Medical History:  Diagnosis Date  . Angiolipoma of kidney   . Arthritis    "hands" (09/02/2013)  . B12 deficiency   . Epicondylitis, lateral    Right  . GERD (gastroesophageal reflux disease)   . IBS (irritable bowel syndrome)   . Migraine headache    "maybe 6-8 times/yr" (09/02/2013)  . Shortness of breath    "all the time when I came in today" (09/02/2013)   Past Surgical History:  Procedure Laterality Date  . APPENDECTOMY  1984  . PALATAL EXPANSION  2004  . REDUCTION MAMMAPLASTY Bilateral ~ 2009   Current Outpatient Medications on File Prior to Visit  Medication Sig Dispense Refill  . Albuterol Sulfate (PROAIR RESPICLICK) 660 (90 Base) MCG/ACT AEPB Inhale 2 puffs into the lungs every 4 (four) hours. 1 each 3  . ALPRAZolam (XANAX) 0.5 MG tablet TAKE 1 TABLET BY MOUTH AT BEDTIME AS NEEDED FOR SLEEP OR ANXIETY 30 tablet 2  . celecoxib (CELEBREX) 200 MG capsule TAKE 1 CAPSULE BY MOUTH TWICE A DAY 60 capsule 3  . Cholecalciferol (VITAMIN D PO) Take by mouth.    . cyclobenzaprine (FLEXERIL) 10 MG tablet TAKE 1 TABLET BY MOUTH EVERY 8 HOURS AS NEEDED FOR MUSCLE SPASM 60 tablet 1  . DULoxetine (CYMBALTA) 60 MG capsule TAKE 1 CAPSULE BY MOUTH EVERY DAY 90 capsule 1  . linaclotide (LINZESS) 290 MCG CAPS capsule Take 1 capsule (290 mcg total) by mouth daily before breakfast. 30 capsule 3  . metaxalone (SKELAXIN) 800 MG tablet Take 800 mg by mouth 3 (three) times daily.    . norethindrone  (MICRONOR) 0.35 MG tablet Camila 0.35 mg tablet    . oxyCODONE-acetaminophen (PERCOCET) 10-325 MG tablet Take 1 tablet by mouth every 8 (eight) hours as needed for pain (for migraines). 30 tablet 0  . promethazine (PHENERGAN) 25 MG tablet TAKE 1 TABELT BY MOUTH EVERY 4 HOURS 20 tablet 1   No current facility-administered medications on file prior to visit.   Allergies  Allergen Reactions  . Prednisone Rash       . Ceftin [Cefuroxime Axetil] Rash  . Sulfa Antibiotics Rash   Social History   Socioeconomic History  . Marital status: Married    Spouse name: Not on file  . Number of children: Not on file  . Years of education: Not on file  . Highest education level: Not on file  Occupational History  . Not on file  Tobacco Use  . Smoking status: Never Smoker  . Smokeless tobacco: Never Used  Vaping Use  . Vaping Use: Never used  Substance and Sexual Activity  . Alcohol use: No  . Drug use: No  . Sexual activity: Not Currently  Other Topics Concern  . Not on file  Social History Narrative  . Not on file   Social Determinants of Health   Financial Resource Strain: Not on file  Food Insecurity: Not on  file  Transportation Needs: Not on file  Physical Activity: Not on file  Stress: Not on file  Social Connections: Not on file  Intimate Partner Violence: Not on file      Review of Systems  All other systems reviewed and are negative.      Objective:   Physical Exam Vitals reviewed.  Constitutional:      General: She is not in acute distress.    Appearance: She is normal weight. She is not ill-appearing, toxic-appearing or diaphoretic.  Cardiovascular:     Rate and Rhythm: Normal rate and regular rhythm.     Heart sounds: Normal heart sounds. No murmur heard. No friction rub. No gallop.   Pulmonary:     Effort: Pulmonary effort is normal. No respiratory distress.     Breath sounds: Normal breath sounds. No wheezing or rales.  Abdominal:     General: Abdomen  is flat. Bowel sounds are normal.  Neurological:     Mental Status: She is alert.           Assessment & Plan:  COVID-19  Physical exam today is completely normal.  Reassured [name] I do not appreciate any evidence of lung damage from Covid.  She is clinically recovered.  There is no evidence of a DVT or pulmonary embolism either.  We also took this option to discuss possibly trying Nurtec in place of Emgality.  She will check on the price first

## 2021-02-07 ENCOUNTER — Encounter: Payer: Self-pay | Admitting: Family Medicine

## 2021-02-08 ENCOUNTER — Other Ambulatory Visit: Payer: Self-pay | Admitting: Family Medicine

## 2021-02-08 MED ORDER — NURTEC 75 MG PO TBDP: 75.0000 mg | ORAL_TABLET | ORAL | 5 refills | Status: DC

## 2021-02-09 ENCOUNTER — Telehealth: Payer: Self-pay | Admitting: *Deleted

## 2021-02-09 NOTE — Telephone Encounter (Signed)
Received request from pharmacy for PA on Nurtec.  PA submitted.   Dx:[id number].   Your information has been submitted to [facility]. To check for an updated outcome later, reopen this PA request from your dashboard.  If [facility] has not responded to your request within 24 hours, contact [facility] at [phone]. If you think there may be a problem with your PA request, use our live chat feature at the bottom right.

## 2021-02-10 NOTE — Telephone Encounter (Signed)
Received PA determination.   PA approved 02/09/2021 - 05/12/2021.

## 2021-02-11 ENCOUNTER — Encounter: Payer: Self-pay | Admitting: Family Medicine

## 2021-02-12 ENCOUNTER — Encounter: Payer: Self-pay | Admitting: Family Medicine

## 2021-02-15 ENCOUNTER — Other Ambulatory Visit: Payer: Self-pay | Admitting: Family Medicine

## 2021-02-15 MED ORDER — OXYCODONE-ACETAMINOPHEN 10-325 MG PO TABS: 1.0000 | ORAL_TABLET | Freq: Three times a day (TID) | ORAL | 0 refills | Status: DC | PRN

## 2021-03-10 ENCOUNTER — Other Ambulatory Visit: Payer: Self-pay

## 2021-03-10 ENCOUNTER — Encounter: Payer: Self-pay | Admitting: Family Medicine

## 2021-03-10 ENCOUNTER — Ambulatory Visit (INDEPENDENT_AMBULATORY_CARE_PROVIDER_SITE_OTHER): Payer: BC Managed Care – PPO | Admitting: Family Medicine

## 2021-03-10 VITALS — BP 118/76 | HR 90 | Temp 98.1°F | Resp 14 | Ht 60.0 in | Wt 186.0 lb

## 2021-03-10 DIAGNOSIS — M797 Fibromyalgia: Secondary | ICD-10-CM

## 2021-03-10 MED ORDER — CARISOPRODOL 350 MG PO TABS: 350.0000 mg | ORAL_TABLET | Freq: Four times a day (QID) | ORAL | 0 refills | Status: DC | PRN

## 2021-03-10 NOTE — Progress Notes (Signed)
Subjective:    Patient ID: [name], female    DOB: Feb 16, 1971, 50 y.o.   MRN: [id number]  Patient is 50 year old female with a history of fibromyalgia.  Recently she has been developing pain in the right side of her neck which radiates into the right trapezius down the right side of her back through her shoulder blade, through her ribs, into the muscles just above her right gluteus.  She is tender to palpation all these affected areas.  There are palpable muscle spasm in her right trapezius.  There is tenderness to palpation in the latissimus dorsi on the right side.  There is also tenderness to palpation in her right ribs through the mid axillary line.  She denies any numbness or tingling in her right arm or in her right leg.  She denies any falls or injuries. Past Medical History:  Diagnosis Date  . Angiolipoma of kidney   . Arthritis    "hands" (09/02/2013)  . B12 deficiency   . Epicondylitis, lateral    Right  . GERD (gastroesophageal reflux disease)   . IBS (irritable bowel syndrome)   . Migraine headache    "maybe 6-8 times/yr" (09/02/2013)  . Shortness of breath    "all the time when I came in today" (09/02/2013)   Past Surgical History:  Procedure Laterality Date  . APPENDECTOMY  1984  . PALATAL EXPANSION  2004  . REDUCTION MAMMAPLASTY Bilateral ~ 2009   Current Outpatient Medications on File Prior to Visit  Medication Sig Dispense Refill  . Albuterol Sulfate (PROAIR RESPICLICK) 400 (90 Base) MCG/ACT AEPB Inhale 2 puffs into the lungs every 4 (four) hours. 1 each 3  . ALPRAZolam (XANAX) 0.5 MG tablet TAKE 1 TABLET BY MOUTH AT BEDTIME AS NEEDED FOR SLEEP OR ANXIETY 30 tablet 2  . Cholecalciferol (VITAMIN D PO) Take by mouth.    . cyclobenzaprine (FLEXERIL) 10 MG tablet TAKE 1 TABLET BY MOUTH EVERY 8 HOURS AS NEEDED FOR MUSCLE SPASM 60 tablet 1  . DULoxetine (CYMBALTA) 60 MG capsule TAKE 1 CAPSULE BY MOUTH EVERY DAY 90 capsule 1  . linaclotide (LINZESS) 290 MCG CAPS  capsule Take 1 capsule (290 mcg total) by mouth daily before breakfast. 30 capsule 3  . norethindrone (MICRONOR) 0.35 MG tablet Camila 0.35 mg tablet    . oxyCODONE-acetaminophen (PERCOCET) 10-325 MG tablet Take 1 tablet by mouth every 8 (eight) hours as needed for pain (for migraines). 30 tablet 0  . promethazine (PHENERGAN) 25 MG tablet TAKE 1 TABELT BY MOUTH EVERY 4 HOURS 20 tablet 1  . Rimegepant Sulfate (NURTEC) 75 MG TBDP Take 75 mg by mouth every other day. 15 tablet 5   No current facility-administered medications on file prior to visit.   Allergies  Allergen Reactions  . Prednisone Rash       . Ceftin [Cefuroxime Axetil] Rash  . Sulfa Antibiotics Rash   Social History   Socioeconomic History  . Marital status: Married    Spouse name: Not on file  . Number of children: Not on file  . Years of education: Not on file  . Highest education level: Not on file  Occupational History  . Not on file  Tobacco Use  . Smoking status: Never Smoker  . Smokeless tobacco: Never Used  Vaping Use  . Vaping Use: Never used  Substance and Sexual Activity  . Alcohol use: No  . Drug use: No  . Sexual activity: Not Currently  Other Topics Concern  .  Not on file  Social History Narrative  . Not on file   Social Determinants of Health   Financial Resource Strain: Not on file  Food Insecurity: Not on file  Transportation Needs: Not on file  Physical Activity: Not on file  Stress: Not on file  Social Connections: Not on file  Intimate Partner Violence: Not on file      Review of Systems  All other systems reviewed and are negative.      Objective:   Physical Exam Vitals reviewed.  Constitutional:      General: She is not in acute distress.    Appearance: She is normal weight. She is not ill-appearing, toxic-appearing or diaphoretic.  Cardiovascular:     Rate and Rhythm: Normal rate and regular rhythm.     Heart sounds: Normal heart sounds. No murmur heard. No friction  rub. No gallop.   Pulmonary:     Effort: Pulmonary effort is normal. No respiratory distress.     Breath sounds: Normal breath sounds. No wheezing or rales.  Abdominal:     General: Abdomen is flat. Bowel sounds are normal.  Musculoskeletal:     Cervical back: Tenderness present. No erythema or bony tenderness. Pain with movement present.     Thoracic back: Spasms and tenderness present. No bony tenderness.     Lumbar back: Tenderness present. No bony tenderness.       Back:  Neurological:     Mental Status: She is alert.           Assessment & Plan:  Fibromyalgia I believe most of this is muscular pain.  I have asked her to temporarily stop Flexeril.  I also want her to temporarily stop Xanax.  She only uses oxycodone for breakthrough migraines.  I have asked her to stop this temporarily as well.  Instead we will use Soma 350 mg every 6 hours as needed for muscle spasms of muscle pain.  I explained that this medication is addictive and therefore I intend her to use it sparingly only for short period of time perhaps in the next 2 weeks.  We will then discontinue the medication and resume her previous medicines.  Call me back immediately if symptoms change

## 2021-03-28 ENCOUNTER — Other Ambulatory Visit: Payer: Self-pay | Admitting: Family Medicine

## 2021-03-30 ENCOUNTER — Ambulatory Visit: Payer: BC Managed Care – PPO | Admitting: Podiatry

## 2021-04-03 ENCOUNTER — Other Ambulatory Visit: Payer: Self-pay | Admitting: Family Medicine

## 2021-04-04 ENCOUNTER — Other Ambulatory Visit: Payer: Self-pay

## 2021-04-04 ENCOUNTER — Encounter: Payer: Self-pay | Admitting: Family Medicine

## 2021-04-05 MED ORDER — OXYCODONE-ACETAMINOPHEN 10-325 MG PO TABS: 1.0000 | ORAL_TABLET | Freq: Three times a day (TID) | ORAL | 0 refills | Status: DC | PRN

## 2021-04-05 NOTE — Telephone Encounter (Signed)
Ok to refill??  Last office visit 03/10/2021.  Last refill 10/28/2020, #2 refills.

## 2021-04-13 ENCOUNTER — Encounter: Payer: Self-pay | Admitting: Podiatry

## 2021-04-13 ENCOUNTER — Ambulatory Visit: Payer: BC Managed Care – PPO | Admitting: Podiatry

## 2021-04-13 ENCOUNTER — Other Ambulatory Visit: Payer: Self-pay

## 2021-04-13 ENCOUNTER — Ambulatory Visit (INDEPENDENT_AMBULATORY_CARE_PROVIDER_SITE_OTHER): Payer: BC Managed Care – PPO

## 2021-04-13 DIAGNOSIS — S9032XA Contusion of left foot, initial encounter: Secondary | ICD-10-CM

## 2021-04-13 DIAGNOSIS — M7752 Other enthesopathy of left foot: Secondary | ICD-10-CM

## 2021-04-14 ENCOUNTER — Ambulatory Visit: Payer: BC Managed Care – PPO | Admitting: Family Medicine

## 2021-04-14 ENCOUNTER — Encounter: Payer: Self-pay | Admitting: Family Medicine

## 2021-04-14 VITALS — BP 122/60 | HR 100 | Temp 98.7°F | Resp 16 | Ht 60.0 in | Wt 184.0 lb

## 2021-04-14 DIAGNOSIS — M25512 Pain in left shoulder: Secondary | ICD-10-CM | POA: Diagnosis not present

## 2021-04-14 DIAGNOSIS — M542 Cervicalgia: Secondary | ICD-10-CM

## 2021-04-14 MED ORDER — DICLOFENAC SODIUM 75 MG PO TBEC: 75.0000 mg | DELAYED_RELEASE_TABLET | Freq: Two times a day (BID) | ORAL | 4 refills | Status: DC

## 2021-04-14 NOTE — Progress Notes (Signed)
Subjective:    Andrea [name] ID: Andrea Andrea [name], female    DOB: 1971-09-04, 50 y.o.   MRN: [id number] 03/10/21 Andrea [name] is 50 year old female with a history of fibromyalgia.  Recently she has been developing pain in the right side of her neck which radiates into the right trapezius down the right side of her back through her shoulder blade, through her ribs, into the muscles just above her right gluteus.  She is tender to palpation all these affected areas.  There are palpable muscle spasm in her right trapezius.  There is tenderness to palpation in the latissimus dorsi on the right side.  There is also tenderness to palpation in her right ribs through the mid axillary line.  She denies any numbness or tingling in her right arm or in her right leg.  She denies any falls or injuries.  At that time, my plan was: I believe most of this is muscular pain.  I have asked her to temporarily stop Flexeril.  I also want her to temporarily stop Xanax.  She only uses oxycodone for breakthrough migraines.  I have asked her to stop this temporarily as well.  Instead we will use Soma 350 mg every 6 hours as needed for muscle spasms of muscle pain.  I explained that this medication is addictive and therefore I intend her to use it sparingly only for short period of time perhaps in the next 2 weeks.  We will then discontinue the medication and resume her previous medicines.  Call me back immediately if symptoms change  04/14/21 Andrea [name] has stopped Soma and is back on Flexeril.  The neck pain became so severe that she went to see an orthopedist over the weekend.  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  She was given a prednisone intramuscular injection per her report and was given also an injection of what sounds like Toradol.  She states that the pain in her neck is somewhat better.  She is also complaining of pain now in her right shoulder with abduction greater  than 90 degrees.  She is also complaining of pain in her left first MTP joint.  She saw her podiatrist earlier this week for this and was given a cortisone injection in the MTP joint.  Its been tender and sore today.  Is not erythematous or warm to the touch.  She denies any fever.  She has been at work walking on concrete all day and I believe she has likely overdone it.  She denies any cervical radiculopathy.  She denies any numbness or tingling in her hands or arms.  However she does report audible crepitus in her neck with range of motion Past Medical History:  Diagnosis Date  . Angiolipoma of kidney   . Arthritis    "hands" (09/02/2013)  . B12 deficiency   . Epicondylitis, lateral    Right  . GERD (gastroesophageal reflux disease)   . IBS (irritable bowel syndrome)   . Migraine headache    "maybe 6-8 times/yr" (09/02/2013)  . Shortness of breath    "all the time when I came in today" (09/02/2013)   Past Surgical History:  Procedure Laterality Date  . APPENDECTOMY  1984  . PALATAL EXPANSION  2004  . REDUCTION MAMMAPLASTY Bilateral ~ 2009   Current Outpatient Medications on File Prior to Visit  Medication Sig Dispense Refill  . Albuterol Sulfate (PROAIR RESPICLICK) 222 (90 Base) MCG/ACT AEPB  Inhale 2 puffs into the lungs every 4 (four) hours. 1 each 3  . ALPRAZolam (XANAX) 0.5 MG tablet TAKE 1 TABLET BY MOUTH AT BEDTIME AS NEEDED FOR SLEEP OR ANXIETY 30 tablet 2  . Cholecalciferol (VITAMIN D PO) Take by mouth.    . cyclobenzaprine (FLEXERIL) 10 MG tablet TAKE 1 TABLET BY MOUTH EVERY 8 HOURS AS NEEDED FOR MUSCLE SPASMS 60 tablet 1  . DULoxetine (CYMBALTA) 60 MG capsule TAKE 1 CAPSULE BY MOUTH EVERY DAY 90 capsule 1  . linaclotide (LINZESS) 290 MCG CAPS capsule Take 1 capsule (290 mcg total) by mouth daily before breakfast. 30 capsule 3  . norethindrone (MICRONOR) 0.35 MG tablet Camila 0.35 mg tablet    . oxyCODONE-acetaminophen (PERCOCET) 10-325 MG tablet Take 1 tablet by mouth every 8  (eight) hours as needed for pain (for migraines). 30 tablet 0  . promethazine (PHENERGAN) 25 MG tablet TAKE 1 TABELT BY MOUTH EVERY 4 HOURS 20 tablet 1  . Rimegepant Sulfate (NURTEC) 75 MG TBDP Take 75 mg by mouth every other day. 15 tablet 5   No current facility-administered medications on file prior to visit.   Allergies  Allergen Reactions  . Prednisone Rash       . Ceftin [Cefuroxime Axetil] Rash  . Sulfa Antibiotics Rash   Social History   Socioeconomic History  . Marital status: Married    Spouse name: Not on file  . Number of children: Not on file  . Years of education: Not on file  . Highest education level: Not on file  Occupational History  . Not on file  Tobacco Use  . Smoking status: Never Smoker  . Smokeless tobacco: Never Used  Vaping Use  . Vaping Use: Never used  Substance and Sexual Activity  . Alcohol use: No  . Drug use: No  . Sexual activity: Not Currently  Other Topics Concern  . Not on file  Social History Narrative  . Not on file   Social Determinants of Health   Financial Resource Strain: Not on file  Food Insecurity: Not on file  Transportation Needs: Not on file  Physical Activity: Not on file  Stress: Not on file  Social Connections: Not on file  Intimate Partner Violence: Not on file      Review of Systems  All other systems reviewed and are negative.      Objective:   Physical Exam Vitals reviewed.  Constitutional:      General: She is not in acute distress.    Appearance: She is normal weight. She is not ill-appearing, toxic-appearing or diaphoretic.  Cardiovascular:     Rate and Rhythm: Normal rate and regular rhythm.     Heart sounds: Normal heart sounds. No murmur heard. No friction rub. No gallop.   Pulmonary:     Effort: Pulmonary effort is normal. No respiratory distress.     Breath sounds: Normal breath sounds. No wheezing or rales.  Abdominal:     General: Abdomen is flat. Bowel sounds are normal.   Musculoskeletal:     Right shoulder: No swelling. Decreased range of motion.     Cervical back: Tenderness present. No erythema or bony tenderness. Pain with movement present.       Back:     Left foot: Tenderness and bony tenderness present. No crepitus.       Feet:  Neurological:     Mental Status: She is alert.           Assessment &  Plan:  Neck pain  Acute pain of left shoulder  I do not have any imaging available of the cervical spine.  However per the Andrea [name]'s report she had an x-ray at the orthopedist.  I have recommended that we try treating this as degenerative disc disease with an anti-inflammatory such as diclofenac 75 mg twice daily as needed for pain.  If this is not improving the pain in her neck, the neck step would be to proceed with an MRI of the cervical spine to evaluate if she is a candidate for injections.  I believe the pain in her shoulder is likely subacromial bursitis however we will also try to treat this with diclofenac.  If not improving, we could try cortisone injection in the subacromial space.  I believe the pain in the left first MTP joint is likely just from her overdoing it today at work.  I see no indication for septic arthritis.  Recommend trying diclofenac for this pain as well.  Discontinue Soma to avoid habituation

## 2021-04-15 ENCOUNTER — Telehealth: Payer: Self-pay | Admitting: Podiatry

## 2021-04-15 NOTE — Telephone Encounter (Signed)
Pt called stating the wrong notes were put in her MyChart and she would like them to be changed so she could share the treatment she received with her medical doctor.  Please advise.

## 2021-04-25 ENCOUNTER — Ambulatory Visit (INDEPENDENT_AMBULATORY_CARE_PROVIDER_SITE_OTHER): Payer: BC Managed Care – PPO | Admitting: Nurse Practitioner

## 2021-04-25 ENCOUNTER — Other Ambulatory Visit: Payer: Self-pay

## 2021-04-25 VITALS — BP 110/72 | HR 109 | Temp 98.1°F

## 2021-04-25 DIAGNOSIS — J40 Bronchitis, not specified as acute or chronic: Secondary | ICD-10-CM

## 2021-04-25 MED ORDER — BENZONATATE 100 MG PO CAPS: 100.0000 mg | ORAL_CAPSULE | Freq: Two times a day (BID) | ORAL | 0 refills | Status: DC | PRN

## 2021-04-25 MED ORDER — PREDNISONE 20 MG PO TABS: 40.0000 mg | ORAL_TABLET | Freq: Every day | ORAL | 0 refills | Status: DC

## 2021-04-25 NOTE — Progress Notes (Signed)
Subjective:    Andrea [name] ID: Andrea Andrea [name], female    DOB: December 18, 1970, 50 y.o.   MRN: [id number]  HPI: Andrea Andrea [name] is a 50 y.o. female presenting for cough.  Chief Complaint  Andrea [name] presents with  . 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 COVID testing history: Tested 2 times at home 3 days apart and both home test were negative COVID vaccination history: Fully vaccinated Fever: no Cough: Yes -dry but congested Shortness of breath: Yes-has been using albuterol inhaler Wheezing: yes Chest pain: no Chest tightness: yes  Pain with inspiration: Yes Chest congestion: no Nasal congestion: yes Runny nose: yes Post nasal drip: no Sneezing: no Sore throat: yes Swollen glands: no Sinus pressure: no Headache: no Face pain: no Toothache: no Ear pain: no  Ear pressure: no  Eyes red/itching:no Eye drainage/crusting: no  Nausea: no  Vomiting: no Diarrhea: no  Change in appetite: no  Loss of taste/smell: no  Rash: no Fatigue: yes 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 Prednisone Rash       . Ceftin [Cefuroxime Axetil] Rash  . Sulfa Antibiotics Rash    Outpatient Encounter Medications as of 04/25/2021  Medication Sig  . benzonatate (TESSALON) 100 MG capsule Take 1 capsule (100 mg total) by mouth 2 (two) times daily as needed for cough.  . predniSONE (DELTASONE) 20 MG tablet Take 2 tablets (40 mg total) by mouth daily with breakfast.  . Albuterol Sulfate (PROAIR RESPICLICK) 237 (90 Base) MCG/ACT AEPB Inhale 2 puffs into the lungs every 4 (four) hours.  . ALPRAZolam (XANAX) 0.5 MG tablet TAKE 1 TABLET BY MOUTH AT BEDTIME AS  NEEDED FOR SLEEP OR ANXIETY  . Cholecalciferol (VITAMIN D PO) Take by mouth.  . cyclobenzaprine (FLEXERIL) 10 MG tablet TAKE 1 TABLET BY MOUTH EVERY 8 HOURS AS NEEDED FOR MUSCLE SPASMS  . diclofenac (VOLTAREN) 75 MG EC tablet Take 1 tablet (75 mg total) by mouth 2 (two) times daily.  . DULoxetine (CYMBALTA) 60 MG capsule TAKE 1 CAPSULE BY MOUTH EVERY DAY  . linaclotide (LINZESS) 290 MCG CAPS capsule Take 1 capsule (290 mcg total) by mouth daily before breakfast.  . norethindrone (MICRONOR) 0.35 MG tablet Camila 0.35 mg tablet  . oxyCODONE-acetaminophen (PERCOCET) 10-325 MG tablet Take 1 tablet by mouth every 8 (eight) hours as needed for pain (for migraines).  . promethazine (PHENERGAN) 25 MG tablet TAKE 1 TABELT BY MOUTH EVERY 4 HOURS  . Rimegepant Sulfate (NURTEC) 75 MG TBDP Take 75 mg by mouth every other day.   No facility-administered encounter medications on file as of 04/25/2021.    Andrea [name] Active Problem List   Diagnosis Date Noted  . Lateral epicondylitis of right elbow 08/26/2018  . Trigger middle finger of right hand 08/26/2018  . Irritable bowel syndrome 06/29/2018  . Menstrual migraine 06/29/2018  . Fibromyalgia 08/15/2017  . Chest pain with low risk for cardiac etiology 09/02/2013  . Family history of early CAD 09/02/2013  . B12 deficiency     Past Medical History:  Diagnosis Date  . Angiolipoma of kidney   . Arthritis    "hands" (09/02/2013)  . B12 deficiency   . Epicondylitis, lateral    Right  .  GERD (gastroesophageal reflux disease)   . IBS (irritable bowel syndrome)   . Migraine headache    "maybe 6-8 times/yr" (09/02/2013)  . Shortness of breath    "all the time when I came in today" (09/02/2013)    Relevant past medical, surgical, family and social history reviewed and updated as indicated. Interim medical history since our last visit reviewed.  Review of Systems Per HPI unless specifically indicated above     Objective:    BP 110/72 (BP Location:  Left Arm, Andrea [name] Position: Sitting, Cuff Size: Normal)   Pulse (!) 109   Temp 98.1 F (36.7 C) (Oral)   SpO2 99%   Wt Readings from Last 3 Encounters:  04/14/21 184 lb (83.5 kg)  03/10/21 186 lb (84.4 kg)  02/03/21 188 lb (85.3 kg)    Physical Exam Vitals reviewed.  Constitutional:      General: She is not in acute distress.    Appearance: She is not toxic-appearing.  HENT:     Head: Normocephalic and atraumatic.     Right Ear: Tympanic membrane, ear canal and external ear normal.     Left Ear: Tympanic membrane, ear canal and external ear normal.     Nose: Nose normal. No congestion.     Mouth/Throat:     Mouth: Mucous membranes are moist.     Pharynx: Oropharynx is clear. No oropharyngeal exudate or posterior oropharyngeal erythema.  Eyes:     General: No scleral icterus.       Right eye: No discharge.        Left eye: No discharge.  Cardiovascular:     Rate and Rhythm: Tachycardia present.  Pulmonary:     Effort: Pulmonary effort is normal. No respiratory distress.     Breath sounds: Wheezing present. No rhonchi or rales.     Comments: Wheezes heard in anterior upper lobes; lungs clear to auscultation posteriorly.  No accessory muscle use Musculoskeletal:     Cervical back: Normal range of motion and neck supple.  Lymphadenopathy:     Cervical: No cervical adenopathy.  Skin:    General: Skin is warm and dry.     Capillary Refill: Capillary refill takes less than 2 seconds.     Coloration: Skin is not jaundiced or pale.     Findings: No erythema.  Neurological:     Mental Status: She is alert and oriented to person, place, and time.     Motor: No weakness.     Gait: Gait normal.  Psychiatric:        Mood and Affect: Mood normal.        Behavior: Behavior normal.        Thought Content: Thought content normal.        Judgment: Judgment normal.       Assessment & Plan:  1. Bronchitis Acute.  Likely viral in etiology.  Andrea [name] politely declines viral testing  today.  Given history of asthma, will start on prednisone 40 mg daily x5 days.  Okay to use Tessalon Perles for cough suppressant at nighttime.  Continue to push hydration, use over-the-counter Mucinex, saline nasal spray.  Follow-up if symptoms not improving.  Note for work given to not spend more than 10 minutes outside in the heat due to her breathing.  - predniSONE (DELTASONE) 20 MG tablet; Take 2 tablets (40 mg total) by mouth daily with breakfast.  Dispense: 10 tablet; Refill: 0 - benzonatate (TESSALON) 100 MG capsule; Take 1 capsule (100 mg total)  by mouth 2 (two) times daily as needed for cough.  Dispense: 20 capsule; Refill: 0    Follow up plan: Return if symptoms worsen or fail to improve.

## 2021-04-26 ENCOUNTER — Encounter: Payer: Self-pay | Admitting: Nurse Practitioner

## 2021-05-02 ENCOUNTER — Other Ambulatory Visit: Payer: Self-pay

## 2021-05-02 ENCOUNTER — Encounter: Payer: Self-pay | Admitting: Nurse Practitioner

## 2021-05-02 ENCOUNTER — Encounter: Payer: Self-pay | Admitting: Family Medicine

## 2021-05-02 ENCOUNTER — Ambulatory Visit (INDEPENDENT_AMBULATORY_CARE_PROVIDER_SITE_OTHER): Payer: BC Managed Care – PPO | Admitting: Nurse Practitioner

## 2021-05-02 VITALS — BP 104/68 | HR 98 | Temp 99.0°F | Ht 60.0 in | Wt 184.0 lb

## 2021-05-02 DIAGNOSIS — R002 Palpitations: Secondary | ICD-10-CM

## 2021-05-02 DIAGNOSIS — M7752 Other enthesopathy of left foot: Secondary | ICD-10-CM | POA: Diagnosis not present

## 2021-05-02 MED ORDER — BETAMETHASONE SOD PHOS & ACET 6 (3-3) MG/ML IJ SUSP: 3.0000 mg | Freq: Once | INTRAMUSCULAR | Status: AC

## 2021-05-02 MED ORDER — BETAMETHASONE SOD PHOS & ACET 6 (3-3) MG/ML IJ SUSP
3.0000 mg | Freq: Once | INTRAMUSCULAR | Status: AC
  Administered 2021-05-02: 3 mg via INTRA_ARTICULAR

## 2021-05-02 MED ORDER — HYDROXYZINE PAMOATE 25 MG PO CAPS: 25.0000 mg | ORAL_CAPSULE | Freq: Three times a day (TID) | ORAL | 0 refills | Status: DC | PRN

## 2021-05-02 NOTE — Progress Notes (Signed)
Subjective:    [name] ID: Andrea [name], female    DOB: [birth year], 50 y.o.   MRN: [id number]  HPI: JENE HUQ is a 50 y.o. female presenting for palpitations.  Chief Complaint  [name] presents with  . Tachycardia    Feels tachy at times, woke this morning with really fast heart rate and headache   PALPITATIONS Started zofran and cipro yesterday for abdominal pain/diarrhea - jiffy peanut butter recalled. Treated last week with prednisone for bronchitis; stopped prednisone last week.  Bronchitis is feeling better. Duration: days Symptom description: racing Duration of episode: comes and goes  Frequency: 10 minutes at the most Activity when event occurred: sitting down Related to exertion: no Dyspnea: yes Chest pain: no; heaviness; no sharp pains or elephant on chest Syncope: yes Anxiety/stress: yes Nausea/vomiting: yes; nausea without vomiting Diaphoresis: no Coronary artery disease: no Congestive heart failure: no Arrhythmia:no Thyroid disease: no Caffeine intake: 2 bottled diet Dr. Samson Frederic per day Status:   stable Treatments attempted: nothing  Decreased appetite: yes Diarrhea: had 1 episode  [name] wonders if palpitations could be related to stress.  Allergies  Allergen Reactions  . Prednisone Rash       . Ceftin [Cefuroxime Axetil] Rash  . Sulfa Antibiotics Rash    Outpatient Encounter Medications as of 05/02/2021  Medication Sig  . Albuterol Sulfate (PROAIR RESPICLICK) 220 (90 Base) MCG/ACT AEPB Inhale 2 puffs into the lungs every 4 (four) hours.  . ALPRAZolam (XANAX) 0.5 MG tablet TAKE 1 TABLET BY MOUTH AT BEDTIME AS NEEDED FOR SLEEP OR ANXIETY  . Cholecalciferol (VITAMIN D PO) Take by mouth.  . cyclobenzaprine (FLEXERIL) 10 MG tablet TAKE 1 TABLET BY MOUTH EVERY 8 HOURS AS NEEDED FOR MUSCLE SPASMS  . DULoxetine (CYMBALTA) 60 MG capsule TAKE 1 CAPSULE BY MOUTH EVERY DAY  . hydrOXYzine (VISTARIL) 25 MG capsule Take 1 capsule (25 mg total) by  mouth every 8 (eight) hours as needed for anxiety. Take first dose at night time and monitor for drowsiness.  If this  Medication makes you drowsy, do not take while driving or operating heavy machinery.  Marland Kitchen linaclotide (LINZESS) 290 MCG CAPS capsule Take 1 capsule (290 mcg total) by mouth daily before breakfast.  . norethindrone (MICRONOR) 0.35 MG tablet Camila 0.35 mg tablet  . oxyCODONE-acetaminophen (PERCOCET) 10-325 MG tablet Take 1 tablet by mouth every 8 (eight) hours as needed for pain (for migraines).  . promethazine (PHENERGAN) 25 MG tablet TAKE 1 TABELT BY MOUTH EVERY 4 HOURS  . [DISCONTINUED] benzonatate (TESSALON) 100 MG capsule Take 1 capsule (100 mg total) by mouth 2 (two) times daily as needed for cough.  . [DISCONTINUED] diclofenac (VOLTAREN) 75 MG EC tablet Take 1 tablet (75 mg total) by mouth 2 (two) times daily.  . [DISCONTINUED] predniSONE (DELTASONE) 20 MG tablet Take 2 tablets (40 mg total) by mouth daily with breakfast.  . [DISCONTINUED] Rimegepant Sulfate (NURTEC) 75 MG TBDP Take 75 mg by mouth every other day.   Facility-Administered Encounter Medications as of 05/02/2021  Medication  . betamethasone acetate-betamethasone sodium phosphate (CELESTONE) injection 3 mg    [name] Active Problem List   Diagnosis Date Noted  . Lateral epicondylitis of right elbow 08/26/2018  . Trigger middle finger of right hand 08/26/2018  . Irritable bowel syndrome 06/29/2018  . Menstrual migraine 06/29/2018  . Fibromyalgia 08/15/2017  . Chest pain with low risk for cardiac etiology 09/02/2013  . Family history of early CAD 09/02/2013  . B12 deficiency  Past Medical History:  Diagnosis Date  . Angiolipoma of kidney   . Arthritis    "hands" (09/02/2013)  . B12 deficiency   . Epicondylitis, lateral    Right  . GERD (gastroesophageal reflux disease)   . IBS (irritable bowel syndrome)   . Migraine headache    "maybe 6-8 times/yr" (09/02/2013)  . Shortness of breath    "all the  time when I came in today" (09/02/2013)    Relevant past medical, surgical, family and social history reviewed and updated as indicated. Interim medical history since our last visit reviewed.  Review of Systems  Per HPI unless specifically indicated above     Objective:    BP 104/68   Pulse (!) 104   Temp 99 F (37.2 C)   Ht 5' (1.524 m)   Wt 184 lb (83.5 kg)   SpO2 100%   BMI 35.94 kg/m   Wt Readings from Last 3 Encounters:  05/02/21 184 lb (83.5 kg)  04/14/21 184 lb (83.5 kg)  03/10/21 186 lb (84.4 kg)    Physical Exam Vitals and nursing note reviewed.  Constitutional:      General: She is not in acute distress.    Appearance: Normal appearance. She is not toxic-appearing.  Eyes:     General: No scleral icterus.    Extraocular Movements: Extraocular movements intact.  Cardiovascular:     Rate and Rhythm: Normal rate and regular rhythm.     Heart sounds: Normal heart sounds. No murmur heard.   Pulmonary:     Effort: Pulmonary effort is normal. No respiratory distress.     Breath sounds: Normal breath sounds. No wheezing, rhonchi or rales.  Musculoskeletal:     Right lower leg: No edema.     Left lower leg: No edema.  Skin:    General: Skin is warm and dry.     Capillary Refill: Capillary refill takes less than 2 seconds.     Coloration: Skin is not jaundiced or pale.     Findings: No erythema.  Neurological:     Mental Status: She is alert and oriented to person, place, and time.  Psychiatric:        Attention and Perception: Attention and perception normal.        Mood and Affect: Affect normal. Mood is anxious.        Speech: Speech normal.        Behavior: Behavior normal.        Thought Content: Thought content normal.        Cognition and Memory: Cognition and memory normal.        Judgment: Judgment normal.         Assessment & Plan:  1. Palpitations Acute.  EKG today normal with exception of shortened PR axis.  Previous EKG in September 2018  showed same and had work-up at that time with Cardiology for palpitations and family history early CAD.   [name] is relieved to hear the EKG remains unchanged when compared with past EKG.  For now, will treat underlying stress with hydroxyzine 25 mg daily.  If this does not help, return to clinic and consider checking lab work including blood counts, thyroid hormone, electrolytes and kidney liver function.  Can also consider starting daily medication to help with stress.  - EKG 12-Lead - hydrOXYzine (VISTARIL) 25 MG capsule; Take 1 capsule (25 mg total) by mouth every 8 (eight) hours as needed for anxiety. Take first dose at night time and  monitor for drowsiness.  If this  Medication makes you drowsy, do not take while driving or operating heavy machinery.  Dispense: 30 capsule; Refill: 0    Follow up plan: Return if symptoms worsen or fail to improve.

## 2021-05-02 NOTE — Progress Notes (Signed)
   HPI: 50 y.o. female presenting today as a new patient for evaluation of left foot pain this been going on for approximately 6-8 months now.  Gradual onset.  She has sharp Pain in between the toes and on the ball of her foot.  Aggravated by walking.  She currently takes Celebrex.  She presents for further treatment and evaluation  Past Medical History:  Diagnosis Date  . Angiolipoma of kidney   . Arthritis    "hands" (09/02/2013)  . B12 deficiency   . Epicondylitis, lateral    Right  . GERD (gastroesophageal reflux disease)   . IBS (irritable bowel syndrome)   . Migraine headache    "maybe 6-8 times/yr" (09/02/2013)  . Shortness of breath    "all the time when I came in today" (09/02/2013)     Physical Exam: General: The patient is alert and oriented x3 in no acute distress.  Dermatology: Skin is warm, dry and supple bilateral lower extremities. Negative for open lesions or macerations.  Vascular: Palpable pedal pulses bilaterally. No edema or erythema noted. Capillary refill within normal limits.  Neurological: Epicritic and protective threshold grossly intact bilaterally.   Musculoskeletal Exam: Range of motion within normal limits to all pedal and ankle joints bilateral.  There is no exception of some limited range of motion with pain on palpation and range of motion to the left first MTPJ consistent with findings of a hallux limitus muscle strength 5/5 in all groups bilateral.   Radiographic Exam:  Normal osseous mineralization.  Degenerative changes noted to the left first MTPJ with joint space narrowing and periarticular spurring  Assessment: 1.  Hallux limitus/first MTPJ capsulitis left   Plan of Care:  1. Patient evaluated. X-Rays reviewed.  2.  Injection of 0.5 cc Celestone Soluspan injection of the first MTPJ left 3.  Continue Celebrex as prescribed 4.  Today we did discuss the possibility of surgery in the future if the toe becomes increasingly aggravating and  limiting her quality of life and daily activities 5.  Return to clinic 2 months      Edrick Kins, DPM [facility]  Dr. Edrick Kins, DPM    [address], [place] [address]                Office [phone]  Fax [phone]

## 2021-05-03 ENCOUNTER — Other Ambulatory Visit: Payer: Self-pay | Admitting: Family Medicine

## 2021-05-03 ENCOUNTER — Encounter: Payer: Self-pay | Admitting: Nurse Practitioner

## 2021-05-04 ENCOUNTER — Other Ambulatory Visit: Payer: Self-pay | Admitting: *Deleted

## 2021-05-04 ENCOUNTER — Other Ambulatory Visit: Payer: Self-pay | Admitting: Family Medicine

## 2021-05-04 MED ORDER — PROAIR RESPICLICK 108 (90 BASE) MCG/ACT IN AEPB: 2.0000 | INHALATION_SPRAY | RESPIRATORY_TRACT | 3 refills | Status: DC

## 2021-05-17 ENCOUNTER — Telehealth: Payer: Self-pay | Admitting: *Deleted

## 2021-05-17 NOTE — Telephone Encounter (Signed)
Received request from pharmacy for PA on Nurtec.  PA submitted.   Dx:G43.909- [id number].  Your information has been submitted to [place]. To check for an updated outcome later, reopen this PA request from your dashboard.  If [facility] has not responded to your request within 24 hours, contact [place] at [phone]

## 2021-05-19 ENCOUNTER — Other Ambulatory Visit: Payer: Self-pay | Admitting: Family Medicine

## 2021-05-19 MED ORDER — OXYCODONE-ACETAMINOPHEN 10-325 MG PO TABS: 1.0000 | ORAL_TABLET | Freq: Three times a day (TID) | ORAL | 0 refills | Status: DC | PRN

## 2021-05-19 NOTE — Telephone Encounter (Signed)
PDMP reviewed.  Appears patient takes this medication sporadically for pain.  Refill given in place of PCP who is out of the office.

## 2021-05-19 NOTE — Telephone Encounter (Signed)
Ok to refill??  Last office visit 04/14/2021.  Last refill 04/05/2021.

## 2021-05-19 NOTE — Telephone Encounter (Signed)
Received PA determination.   PA approved 05/17/2021- 05/17/2022.

## 2021-06-15 ENCOUNTER — Ambulatory Visit: Payer: BC Managed Care – PPO | Admitting: Podiatry

## 2021-07-03 ENCOUNTER — Other Ambulatory Visit: Payer: Self-pay | Admitting: Nurse Practitioner

## 2021-07-04 MED ORDER — OXYCODONE-ACETAMINOPHEN 10-325 MG PO TABS: 1.0000 | ORAL_TABLET | Freq: Three times a day (TID) | ORAL | 0 refills | Status: DC | PRN

## 2021-07-18 ENCOUNTER — Encounter: Payer: Self-pay | Admitting: Family Medicine

## 2021-07-20 ENCOUNTER — Other Ambulatory Visit: Payer: Self-pay | Admitting: Family Medicine

## 2021-07-25 ENCOUNTER — Encounter: Payer: Self-pay | Admitting: Family Medicine

## 2021-08-15 ENCOUNTER — Observation Stay (HOSPITAL_COMMUNITY)
Admission: EM | Admit: 2021-08-15 | Discharge: 2021-08-15 | Disposition: A | Discharge status: Home / Self Care | Payer: BC Managed Care – PPO | Attending: Emergency Medicine | Admitting: Emergency Medicine

## 2021-08-15 ENCOUNTER — Emergency Department (HOSPITAL_COMMUNITY): Payer: BC Managed Care – PPO

## 2021-08-15 ENCOUNTER — Encounter (HOSPITAL_COMMUNITY): Payer: Self-pay | Admitting: *Deleted

## 2021-08-15 ENCOUNTER — Other Ambulatory Visit: Payer: Self-pay

## 2021-08-15 DIAGNOSIS — R739 Hyperglycemia, unspecified: Secondary | ICD-10-CM | POA: Diagnosis not present

## 2021-08-15 DIAGNOSIS — E6609 Other obesity due to excess calories: Secondary | ICD-10-CM | POA: Diagnosis not present

## 2021-08-15 DIAGNOSIS — N200 Calculus of kidney: Secondary | ICD-10-CM | POA: Diagnosis not present

## 2021-08-15 DIAGNOSIS — N390 Urinary tract infection, site not specified: Secondary | ICD-10-CM | POA: Diagnosis present

## 2021-08-15 DIAGNOSIS — Z6835 Body mass index (BMI) 35.0-35.9, adult: Secondary | ICD-10-CM

## 2021-08-15 DIAGNOSIS — Z20822 Contact with and (suspected) exposure to covid-19: Secondary | ICD-10-CM | POA: Diagnosis not present

## 2021-08-15 DIAGNOSIS — N3000 Acute cystitis without hematuria: Secondary | ICD-10-CM

## 2021-08-15 DIAGNOSIS — R1031 Right lower quadrant pain: Secondary | ICD-10-CM | POA: Diagnosis present

## 2021-08-15 HISTORY — DX: Fibromyalgia: M79.7

## 2021-08-15 LAB — CBC
HCT: 39.9 % (ref 36.0–46.0)
Hemoglobin: 12.4 g/dL (ref 12.0–15.0)
MCH: 25.3 pg — ABNORMAL LOW (ref 26.0–34.0)
MCHC: 31.1 g/dL (ref 30.0–36.0)
MCV: 81.3 fL (ref 80.0–100.0)
Platelets: 275 10*3/uL (ref 150–400)
RBC: 4.91 MIL/uL (ref 3.87–5.11)
RDW: 14.1 % (ref 11.5–15.5)
WBC: 21.7 10*3/uL — ABNORMAL HIGH (ref 4.0–10.5)
nRBC: 0 % (ref 0.0–0.2)

## 2021-08-15 LAB — COMPREHENSIVE METABOLIC PANEL
ALT: 16 U/L (ref 0–44)
AST: 18 U/L (ref 15–41)
Albumin: 3.6 g/dL (ref 3.5–5.0)
Alkaline Phosphatase: 81 U/L (ref 38–126)
Anion gap: 10 (ref 5–15)
BUN: 9 mg/dL (ref 6–20)
CO2: 23 mmol/L (ref 22–32)
Calcium: 9.2 mg/dL (ref 8.9–10.3)
Chloride: 102 mmol/L (ref 98–111)
Creatinine, Ser: 0.84 mg/dL (ref 0.44–1.00)
GFR, Estimated: >60 mL/min (ref >60)
Glucose, Bld: 183 mg/dL — ABNORMAL HIGH (ref 70–99)
Potassium: 3.6 mmol/L (ref 3.5–5.1)
Sodium: 135 mmol/L (ref 135–145)
Total Bilirubin: 0.5 mg/dL (ref 0.3–1.2)
Total Protein: 6.5 g/dL (ref 6.5–8.1)

## 2021-08-15 LAB — LIPASE, BLOOD: Lipase: 32 U/L (ref 11–51)

## 2021-08-15 LAB — HEMOGLOBIN A1C
Hgb A1c MFr Bld: 5.8 % — ABNORMAL HIGH (ref 4.8–5.6)
Mean Plasma Glucose: 119.76 mg/dL

## 2021-08-15 LAB — URINALYSIS, ROUTINE W REFLEX MICROSCOPIC
Bilirubin Urine: NEGATIVE
Glucose, UA: 50 mg/dL — AB
Ketones, ur: NEGATIVE mg/dL
Nitrite: NEGATIVE
Protein, ur: NEGATIVE mg/dL
Specific Gravity, Urine: 1.013 (ref 1.005–1.030)
pH: 6 (ref 5.0–8.0)

## 2021-08-15 LAB — D-DIMER, QUANTITATIVE: D-Dimer, Quant: 0.36 ug/mL-FEU (ref 0.00–0.50)

## 2021-08-15 LAB — RESP PANEL BY RT-PCR (FLU A&B, COVID) ARPGX2
Influenza A by PCR: NEGATIVE
Influenza B by PCR: NEGATIVE
SARS Coronavirus 2 by RT PCR: NEGATIVE

## 2021-08-15 LAB — TROPONIN I (HIGH SENSITIVITY)
Troponin I (High Sensitivity): 4 ng/L (ref <18)
Troponin I (High Sensitivity): 5 ng/L (ref <18)

## 2021-08-15 LAB — LACTIC ACID, PLASMA: Lactic Acid, Venous: 1.9 mmol/L (ref 0.5–1.9)

## 2021-08-15 LAB — PREGNANCY, URINE: Preg Test, Ur: NEGATIVE

## 2021-08-15 MED ORDER — IOHEXOL 350 MG/ML SOLN
50.0000 mL | Freq: Once | INTRAVENOUS | Status: AC | PRN
  Administered 2021-08-15: 50 mL via INTRAVENOUS

## 2021-08-15 MED ORDER — ACETAMINOPHEN 325 MG PO TABS
650.0000 mg | ORAL_TABLET | Freq: Once | ORAL | Status: AC
  Administered 2021-08-15: 650 mg via ORAL
  Filled 2021-08-15: qty 2

## 2021-08-15 MED ORDER — CIPROFLOXACIN HCL 500 MG PO TABS: 500.0000 mg | ORAL_TABLET | Freq: Two times a day (BID) | ORAL | 0 refills | Status: AC

## 2021-08-15 MED ORDER — SODIUM CHLORIDE 0.9 % IV BOLUS
1000.0000 mL | Freq: Once | INTRAVENOUS | Status: AC
  Administered 2021-08-15: 1000 mL via INTRAVENOUS

## 2021-08-15 MED ORDER — LACTATED RINGERS IV BOLUS
500.0000 mL | Freq: Once | INTRAVENOUS | Status: AC
  Administered 2021-08-15: 500 mL via INTRAVENOUS

## 2021-08-15 MED ORDER — ONDANSETRON HCL 4 MG/2ML IJ SOLN
4.0000 mg | Freq: Once | INTRAMUSCULAR | Status: AC
  Administered 2021-08-15: 4 mg via INTRAVENOUS
  Filled 2021-08-15: qty 2

## 2021-08-15 MED ORDER — MORPHINE SULFATE (PF) 4 MG/ML IV SOLN
4.0000 mg | Freq: Once | INTRAVENOUS | Status: AC
Start: 2021-08-15 — End: 2021-08-15
  Administered 2021-08-15: 4 mg via INTRAVENOUS
  Filled 2021-08-15: qty 1

## 2021-08-15 MED ORDER — KETOROLAC TROMETHAMINE 30 MG/ML IJ SOLN
30.0000 mg | Freq: Once | INTRAMUSCULAR | Status: AC
  Administered 2021-08-15: 30 mg via INTRAVENOUS
  Filled 2021-08-15: qty 1

## 2021-08-15 MED ORDER — CIPROFLOXACIN IN D5W 400 MG/200ML IV SOLN
400.0000 mg | Freq: Once | INTRAVENOUS | Status: AC
  Administered 2021-08-15: 400 mg via INTRAVENOUS
  Filled 2021-08-15: qty 200

## 2021-08-15 MED ORDER — LACTATED RINGERS IV BOLUS
1000.0000 mL | Freq: Once | INTRAVENOUS | Status: AC
  Administered 2021-08-15: 1000 mL via INTRAVENOUS

## 2021-08-15 NOTE — ED Triage Notes (Signed)
Pt went to [facility] today for UTI symptoms, started taking Macrobid. Reports headache and sob since taking medication. Pt c/o back to lower abd and back. LMP Monday

## 2021-08-15 NOTE — ED Notes (Signed)
Pt ambulatory to waiting room. Pt verbalized understanding of discharge instructions.   

## 2021-08-15 NOTE — ED Provider Notes (Signed)
[place] EMERGENCY DEPARTMENT Provider Note   CSN: [id number] Arrival date & time: 08/15/21  0242     History Chief Complaint  Patient presents with   Abdominal Pain    Andrea [name] is a 50 y.o. 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  The patient reports bilateral lower abdominal pain, characterized as pressure, onset 1 week ago.  Pain has been constant since yesterday.  She reports associated nausea and low back pain she also reports that her abdomen feels more bloated. She reports increased pressure with urination.  She was seen by urgent care today and was diagnosed with a UTI and started on Macrobid.  She has taken 2 doses tonight, she developed increasing shortness of breath, pressure in the middle of her chest, and headache.  She is currently on OCPs for hormonal migraines and has not had a menstrual cycle and months, but did have a menstrual cycle last week.  She reports that she thought bleeding had resolved, but she does have some blood noted on the toilet tissue when she wiped earlier tonight.  She has a history of chronic constipation, but does not feel more constipated than baseline.  She reports questionable fevers intermittently over the last few days.  She did not check her temperature at home.  No vomiting, diarrhea, vaginal discharge, leg swelling, rash, dizziness, lightheadedness, visual changes, flank pain, hematuria, urinary frequency or hesitancy.  She is allergic to cefdinir and sulfa antibiotics.  The history is provided by the patient and medical records. No language interpreter was used.      Past Medical History:  Diagnosis Date   Angiolipoma of kidney    Arthritis    "hands" (09/02/2013)   B12 deficiency    Epicondylitis, lateral    Right   GERD (gastroesophageal reflux disease)    IBS (irritable  bowel syndrome)    Migraine headache    "maybe 6-8 times/yr" (09/02/2013)   Shortness of breath    "all the time when I came in today" (09/02/2013)    Patient Active Problem List   Diagnosis Date Noted   UTI (urinary tract infection) 08/15/2021   Lateral epicondylitis of right elbow 08/26/2018   Trigger middle finger of right hand 08/26/2018   Irritable bowel syndrome 06/29/2018   Menstrual migraine 06/29/2018   Fibromyalgia 08/15/2017   Chest pain with low risk for cardiac etiology 09/02/2013   Family history of early CAD 09/02/2013   B12 deficiency     Past Surgical History:  Procedure Laterality Date   APPENDECTOMY  1984   PALATAL EXPANSION  2004   REDUCTION MAMMAPLASTY Bilateral ~ 2009     OB History   No obstetric history on file.     Family History  Problem Relation Age of Onset   Coronary artery disease Father 32       MI   Heart attack Father    Irregular heart beat Sister    Arrhythmia Paternal Uncle    Congestive Heart Failure Paternal Grandmother     Social History   Tobacco Use   Smoking status: Never   Smokeless tobacco: Never  Vaping Use   Vaping Use: Never used  Substance Use Topics   Alcohol use: No   Drug use: No    Home Medications Prior to Admission medications   Medication Sig Start Date End Date Taking? Authorizing  Provider  acetaminophen (TYLENOL) 500 MG tablet Take 1,000 mg by mouth every 6 (six) hours as needed for moderate pain or headache.   Yes [provider]  albuterol (VENTOLIN HFA) 108 (90 Base) MCG/ACT inhaler Inhale 2 puffs into the lungs every 6 (six) hours as needed for wheezing or shortness of breath. 05/04/21  Yes Pickard, Cammie Mcgee, MD  ALPRAZolam (XANAX) 0.5 MG tablet TAKE 1 TABLET BY MOUTH AT BEDTIME AS NEEDED FOR SLEEP OR ANXIETY Patient taking differently: Take 0.5 mg by mouth at bedtime. 07/21/21  Yes Susy Frizzle, MD  celecoxib (CELEBREX) 200 MG capsule Take 200 mg by mouth daily as needed for mild pain.  06/25/21  Yes [provider]  cyclobenzaprine (FLEXERIL) 10 MG tablet TAKE 1 TABLET BY MOUTH EVERY 8 HOURS AS NEEDED FOR MUSCLE SPASMS Patient taking differently: Take 10 mg by mouth at bedtime. 03/28/21  Yes Susy Frizzle, MD  diclofenac (VOLTAREN) 75 MG EC tablet Take 75 mg by mouth 2 (two) times daily as needed for mild pain. 07/20/21  Yes [provider]  docusate sodium (COLACE) 100 MG capsule Take 200 mg by mouth daily as needed for mild constipation.   Yes [provider]  DULoxetine (CYMBALTA) 60 MG capsule TAKE 1 CAPSULE BY MOUTH EVERY DAY Patient taking differently: Take 60 mg by mouth daily. 05/03/21  Yes Susy Frizzle, MD  EMGALITY 120 MG/ML SOAJ Inject 120 mg into the skin every 30 (thirty) days. 07/18/21  Yes [provider]  hydrOXYzine (VISTARIL) 25 MG capsule Take 1 capsule (25 mg total) by mouth every 8 (eight) hours as needed for anxiety. Take first dose at night time and monitor for drowsiness.  If this  Medication makes you drowsy, do not take while driving or operating heavy machinery. Patient taking differently: Take 25 mg by mouth every 8 (eight) hours as needed for anxiety. 05/02/21  Yes Eulogio Bear, NP  linaclotide (LINZESS) 290 MCG CAPS capsule Take 1 capsule (290 mcg total) by mouth daily before breakfast. Patient taking differently: Take 290 mcg by mouth daily as needed (constipation). 02/05/20  Yes Susy Frizzle, MD  norethindrone (INCASSIA) 0.35 MG tablet Take 1 tablet by mouth at bedtime.   Yes [provider]  oxyCODONE-acetaminophen (PERCOCET) 10-325 MG tablet Take 1 tablet by mouth every 8 (eight) hours as needed for pain (for migraines). 07/04/21  Yes Susy Frizzle, MD  Polyethyl Glycol-Propyl Glycol (SYSTANE OP) Apply 1 drop to eye daily as needed (dry eyes).   Yes [provider]  promethazine (PHENERGAN) 25 MG tablet TAKE 1 TABELT BY MOUTH EVERY 4 HOURS Patient taking differently: Take 25 mg  by mouth every 4 (four) hours as needed for vomiting or nausea. 11/11/20  Yes Susy Frizzle, MD  sodium chloride (OCEAN) 0.65 % SOLN nasal spray Place 1 spray into both nostrils as needed for congestion.   Yes [provider]  nitrofurantoin, macrocrystal-monohydrate, (MACROBID) 100 MG capsule Take 100 mg by mouth See admin instructions. Bid x 10 days Patient not taking: Reported on 08/15/2021 08/14/21   [provider]    Allergies    Cefdinir, Prednisone, Macrolides and ketolides, Ceftin [cefuroxime axetil], and Sulfa antibiotics  Review of Systems   Review of Systems  Constitutional:  Positive for fever (subjective). Negative for activity change, chills and diaphoresis.  HENT:  Negative for congestion and sore throat.   Respiratory:  Positive for shortness of breath.   Cardiovascular:  Positive for chest  pain.  Gastrointestinal:  Positive for abdominal pain, constipation (chronic), nausea and vomiting. Negative for blood in stool and diarrhea.  Genitourinary:  Positive for vaginal bleeding. Negative for dysuria, enuresis, flank pain, genital sores, hematuria, urgency, vaginal discharge and vaginal pain.  Musculoskeletal:  Positive for back pain. Negative for myalgias, neck pain and neck stiffness.  Skin:  Negative for rash and wound.  Allergic/Immunologic: Negative for immunocompromised state.  Neurological:  Negative for seizures, syncope, weakness, numbness and headaches.  Psychiatric/Behavioral:  Negative for confusion.    Physical Exam Updated Vital Signs BP 115/76   Pulse (!) 110   Temp 98.8 F (37.1 C) (Oral)   Resp 19   Ht 5' (1.524 m)   Wt 83.5 kg   LMP 08/08/2021   SpO2 98%   BMI 35.94 kg/m   Physical Exam Vitals and nursing note reviewed.  Constitutional:      General: She is not in acute distress.    Appearance: She is not ill-appearing, toxic-appearing or diaphoretic.     Comments: Uncomfortable appearing.  HENT:     Head: Normocephalic.   Eyes:     Conjunctiva/sclera: Conjunctivae normal.  Cardiovascular:     Rate and Rhythm: Regular rhythm. Tachycardia present.     Heart sounds: No murmur heard.   No friction rub. No gallop.  Pulmonary:     Effort: Pulmonary effort is normal. No respiratory distress.     Breath sounds: No stridor. No wheezing, rhonchi or rales.  Chest:     Chest wall: No tenderness.  Abdominal:     General: There is no distension.     Palpations: Abdomen is soft. There is no mass.     Tenderness: There is abdominal tenderness. There is no right CVA tenderness, left CVA tenderness, guarding or rebound.     Hernia: No hernia is present.     Comments: Tender to palpation in the bilateral lower abdomen.  No rebound or guarding.  Abdomen is distended, but soft.  No CVA tenderness bilaterally.  Normoactive bowel sounds.  Negative Murphy sign.  Musculoskeletal:        General: No tenderness.     Cervical back: Neck supple.     Right lower leg: No edema.     Left lower leg: No edema.     Comments: Spine is nontender.  Skin:    General: Skin is warm.     Coloration: Skin is not jaundiced.     Findings: No rash.  Neurological:     Mental Status: She is alert.  Psychiatric:        Behavior: Behavior normal.    ED Results / Procedures / Treatments   Labs (all labs ordered are listed, but only abnormal results are displayed) Labs Reviewed  COMPREHENSIVE METABOLIC PANEL - Abnormal; Notable for the following components:      Result Value   Glucose, Bld 183 (*)    All other components within normal limits  CBC - Abnormal; Notable for the following components:   WBC 21.7 (*)    MCH 25.3 (*)    All other components within normal limits  URINALYSIS, ROUTINE W REFLEX MICROSCOPIC - Abnormal; Notable for the following components:   APPearance HAZY (*)    Glucose, UA 50 (*)    Hgb urine dipstick SMALL (*)    Leukocytes,Ua LARGE (*)    Bacteria, UA FEW (*)    All other components within normal limits   RESP PANEL BY RT-PCR (FLU A&B, COVID) ARPGX2  CULTURE, BLOOD (ROUTINE X 2)  CULTURE, BLOOD (ROUTINE X 2)  URINE CULTURE  LIPASE, BLOOD  PREGNANCY, URINE  LACTIC ACID, PLASMA  D-DIMER, QUANTITATIVE  TROPONIN I (HIGH SENSITIVITY)  TROPONIN I (HIGH SENSITIVITY)    EKG EKG Interpretation  Date/Time:  Monday August 15 2021 05:37:58 EDT Ventricular Rate:  109 PR Interval:  113 QRS Duration: 74 QT Interval:  300 QTC Calculation: 404 R Axis:   56 Text Interpretation: Sinus tachycardia Multiple ventricular premature complexes Aberrant complex Probable left atrial enlargement new Borderline repolarization abnormality , new T wave inversion Inferolateral leads since 09/03/2013 Confirmed by Blanchie Dessert [phone]) on 08/15/2021 5:42:37 AM  Radiology CT ABDOMEN PELVIS W CONTRAST  Result Date: 08/15/2021 CLINICAL DATA:  Left lower quadrant abdominal pain 10 9 and nausea. EXAM: CT ABDOMEN AND PELVIS WITH CONTRAST TECHNIQUE: Multidetector CT imaging of the abdomen and pelvis was performed using the standard protocol following bolus administration of intravenous contrast. CONTRAST:  47m OMNIPAQUE IOHEXOL 350 MG/ML SOLN COMPARISON:  None. FINDINGS: Lower chest: No significant pulmonary nodules or acute consolidative airspace disease. Hepatobiliary: Normal liver size. No liver mass. Normal gallbladder with no radiopaque cholelithiasis. No biliary ductal dilatation. Pancreas: Normal, with no mass or duct dilation. Spleen: Normal size. No mass. Adrenals/Urinary Tract: Normal adrenals. Normal kidneys with no hydronephrosis and no renal mass. Normal bladder. Stomach/Bowel: Normal non-distended stomach. Normal caliber small bowel with no small bowel wall thickening. Appendectomy. Normal large bowel with no diverticulosis, large bowel wall thickening or pericolonic fat stranding. Vascular/Lymphatic: Normal caliber abdominal aorta. Patent portal, splenic, hepatic and renal veins. No pathologically enlarged  lymph nodes in the abdomen or pelvis. Reproductive: Grossly normal uterus.  No adnexal mass. Other: No pneumoperitoneum, ascites or focal fluid collection. Musculoskeletal: No aggressive appearing focal osseous lesions. Mild lumbar spondylosis. IMPRESSION: No acute abnormality. No evidence of bowel obstruction or acute bowel inflammation. Electronically Signed   By: JIlona SorrelM.D.   On: 08/15/2021 05:42   DG Chest Portable 1 View  Result Date: 08/15/2021 CLINICAL DATA:  Shortness of breath and abdominal pain EXAM: PORTABLE CHEST 1 VIEW COMPARISON:  08/29/2019 FINDINGS: Normal heart size and mediastinal contours. No acute infiltrate or edema. No effusion or pneumothorax. No acute osseous findings. IMPRESSION: Stable chest.  No acute finding. Electronically Signed   By: JMonte FantasiaM.D.   On: 08/15/2021 04:40    Procedures Procedures   Medications Ordered in ED Medications  ciprofloxacin (CIPRO) IVPB 400 mg (400 mg Intravenous New Bag/Given 08/15/21 0655)  ondansetron (ZOFRAN) injection 4 mg (4 mg Intravenous Given 08/15/21 0427)  acetaminophen (TYLENOL) tablet 650 mg (650 mg Oral Given 08/15/21 0428)  morphine 4 MG/ML injection 4 mg (4 mg Intravenous Given 08/15/21 0427)  sodium chloride 0.9 % bolus 1,000 mL (0 mLs Intravenous Stopped 08/15/21 0546)  iohexol (OMNIPAQUE) 350 MG/ML injection 50 mL (50 mLs Intravenous Contrast Given 08/15/21 0530)  ketorolac (TORADOL) 30 MG/ML injection 30 mg (30 mg Intravenous Given 08/15/21 0653)  lactated ringers bolus 1,000 mL (1,000 mLs Intravenous New Bag/Given 08/15/21 [id number]    ED Course  I have reviewed the triage vital signs and the nursing notes.  Pertinent labs & imaging results that were available during my care of the patient were reviewed by me and considered in my medical decision making (see chart for details).    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  Pain worsened yesterday and she went to urgent care and was started on Macrobid for UTI it is taken 2 doses.  However, pain worsened last night, which prompted her visit to the ER.  She has subsequently developed chest pain and shortness of breath.   Tachycardic on arrival to the ED.  She is afebrile, but repeat temp is 99.61 patient felt warm to the touch.  Vital signs are otherwise unremarkable.  On physical exam she does have tenderness palpation of the bilateral lower abdomen, especially over the suprapubic region.  No CVA tenderness.  There is some distention, but abdomen is soft.  No rebound or guarding.  Labs and imaging been reviewed and independently interpreted by me.  The patient has been seen and independently evaluated by Dr. Maryan Rued, attending physician.  Metabolic derangements.  She has a significant leukocytosis of 21.7.  Lactate is normal.  No metabolic derangements.  Pregnancy test is negative.  UA with pyuria and leukocyte esterase.  No nitrites.  However, the sample does appear to be somewhat contaminated with squamous cells.  Urine culture is pending.  Blood cultures are pending.  CT abdomen pelvis was obtained with no focal findings.  There was some concern that patient may have a PE given that she is on OCPs with new shortness of breath, pleuritic chest pain, and tachycardia.  However, after discussing the patient further with Dr. Maryan Rued, suspect that this is secondary to infectious process.  However since patient does not feel markedly improved after arriving in the ED, will plan for admission for antibiotics and further evaluation.  EKG did have some new T wave changes in the inferolateral leads, but initial troponin is not elevated.  Less suspicious for ACS.  Consult to the hospitalist team and Dr. Alcario Drought will accept the patient for admission. The patient  appears reasonably stabilized for admission considering the current resources, flow, and capabilities available in the ED at this time, and I doubt any other [facility] requiring further screening and/or treatment in the ED prior to admission.   Final Clinical Impression(s) / ED Diagnoses Final diagnoses:  Pyelonephritis    Rx / DC Orders ED Discharge Orders     None        Abanoub Hanken A, PA-C 08/15/21 [id number]    Blanchie Dessert, MD 08/18/21 1153

## 2021-08-15 NOTE — ED Provider Notes (Signed)
  Physical Exam  BP 115/76   Pulse (!) 110   Temp 98.8 F (37.1 C) (Oral)   Resp 19   Ht 5' (1.524 m)   Wt 83.5 kg   LMP 08/08/2021   SpO2 98%   BMI 35.94 kg/m   Physical Exam  ED Course/Procedures     Procedures  MDM  This patient was seen and assessed by Dr. Karmen Bongo.  She does not think the patient needs hospitalization.  The patient is also comfortable with this plan.  She has written for another small fluid bolus, and she recommends continuing ciprofloxacin.  Urine culture is pending.       Arnaldo Natal, MD 08/15/21 [phone]

## 2021-08-15 NOTE — ED Notes (Signed)
Pt ambulatory to restroom

## 2021-08-15 NOTE — Consult Note (Signed)
ER Consult   Andrea [name] [id number] DOB: 07-27-1971 DOA: 08/15/2021  PCP: Susy Frizzle, MD Consultants:  Amalia Hailey - podiatry Patient coming from:  Home - lives with husband; NOK: Husband, [phone]  Chief Complaint: Urinary symptoms  HPI: Andrea [name] is a 50 y.o. female with medical history significant of angiolipoma of kidney; and IBS presenting with urinary symptoms. She hasn't had a period in a year, does continuous cycling.  She started with a normal period last week.  She stopped bleeding last week but the pain was still there in her back and lower abdomen.  She went to the doctor and there was blood in her urine, thought it was a UTI.  She took Macrobid and it gave her a headache and she couldn't breathe.  She decided to come here.  Pain is in the lower abdomen.  No dysuria.  All symptoms are within the last week.  Subjective fever.  No apparent frequency.  No weight loss or night sweats.  She has never been told she has DM but has a strong FH including her sister.  Her last A1c was 5.6 in 2014.      ED Course: Carryover, per Dr. Alcario Drought:  50 yo F with 1 week lower abd pain.  Worse yesterday. UC yesterday, macrobid for poss UTI.  No CVA tenderness.  ? Fevers over past few days.  99.6 here.  Has 21k leukocytosis.  UA with leuks and WBC.  EDP started cipro.  Review of Systems: As per HPI; otherwise review of systems reviewed and negative.   Ambulatory Status:  Ambulates without assistance  COVID Vaccine Status:  None  Past Medical History:  Diagnosis Date   Angiolipoma of kidney    Arthritis    "hands" (09/02/2013)   B12 deficiency    Epicondylitis, lateral    Right   Fibromyalgia    GERD (gastroesophageal reflux disease)    IBS (irritable bowel syndrome)    Migraine headache    "maybe 6-8 times/yr" (09/02/2013)    Past Surgical History:  Procedure Laterality Date   APPENDECTOMY  1984   PALATAL EXPANSION  2004   REDUCTION MAMMAPLASTY Bilateral ~ 2009     Social History   Socioeconomic History   Marital status: Married    Spouse name: Not on file   Number of children: Not on file   Years of education: Not on file   Highest education level: Not on file  Occupational History   Occupation: Oncologist  Tobacco Use   Smoking status: Never   Smokeless tobacco: Never  Vaping Use   Vaping Use: Never used  Substance and Sexual Activity   Alcohol use: No   Drug use: No   Sexual activity: Not Currently  Other Topics Concern   Not on file  Social History Narrative   Not on file   Social Determinants of Health   Financial Resource Strain: Not on file  Food Insecurity: Not on file  Transportation Needs: Not on file  Physical Activity: Not on file  Stress: Not on file  Social Connections: Not on file  Intimate Partner Violence: Not on file    Allergies  Allergen Reactions   Cefdinir Hives   Prednisone Rash     Only when takes in large doses   Macrolides And Ketolides     Headache,shortness of breath, heavy chest   Ceftin [Cefuroxime Axetil] Rash   Sulfa Antibiotics Rash    Family History  Problem Relation Age of  Onset   Coronary artery disease Father 58       MI   Heart attack Father    Diabetes Sister    Irregular heart beat Sister    Congestive Heart Failure Paternal Grandmother    Arrhythmia Paternal Uncle     Prior to Admission medications   Medication Sig Start Date End Date Taking? Authorizing Provider  acetaminophen (TYLENOL) 500 MG tablet Take 1,000 mg by mouth every 6 (six) hours as needed for moderate pain or headache.   Yes [provider]  albuterol (VENTOLIN HFA) 108 (90 Base) MCG/ACT inhaler Inhale 2 puffs into the lungs every 6 (six) hours as needed for wheezing or shortness of breath. 05/04/21  Yes Pickard, Cammie Mcgee, MD  ALPRAZolam (XANAX) 0.5 MG tablet TAKE 1 TABLET BY MOUTH AT BEDTIME AS NEEDED FOR SLEEP OR ANXIETY Patient taking differently: Take 0.5 mg by mouth at bedtime.  07/21/21  Yes Susy Frizzle, MD  celecoxib (CELEBREX) 200 MG capsule Take 200 mg by mouth daily as needed for mild pain. 06/25/21  Yes [provider]  cyclobenzaprine (FLEXERIL) 10 MG tablet TAKE 1 TABLET BY MOUTH EVERY 8 HOURS AS NEEDED FOR MUSCLE SPASMS Patient taking differently: Take 10 mg by mouth at bedtime. 03/28/21  Yes Susy Frizzle, MD  diclofenac (VOLTAREN) 75 MG EC tablet Take 75 mg by mouth 2 (two) times daily as needed for mild pain. 07/20/21  Yes [provider]  docusate sodium (COLACE) 100 MG capsule Take 200 mg by mouth daily as needed for mild constipation.   Yes [provider]  DULoxetine (CYMBALTA) 60 MG capsule TAKE 1 CAPSULE BY MOUTH EVERY DAY Patient taking differently: Take 60 mg by mouth daily. 05/03/21  Yes Susy Frizzle, MD  EMGALITY 120 MG/ML SOAJ Inject 120 mg into the skin every 30 (thirty) days. 07/18/21  Yes [provider]  hydrOXYzine (VISTARIL) 25 MG capsule Take 1 capsule (25 mg total) by mouth every 8 (eight) hours as needed for anxiety. Take first dose at night time and monitor for drowsiness.  If this  Medication makes you drowsy, do not take while driving or operating heavy machinery. Patient taking differently: Take 25 mg by mouth every 8 (eight) hours as needed for anxiety. 05/02/21  Yes Eulogio Bear, NP  linaclotide (LINZESS) 290 MCG CAPS capsule Take 1 capsule (290 mcg total) by mouth daily before breakfast. Patient taking differently: Take 290 mcg by mouth daily as needed (constipation). 02/05/20  Yes Susy Frizzle, MD  norethindrone (INCASSIA) 0.35 MG tablet Take 1 tablet by mouth at bedtime.   Yes [provider]  oxyCODONE-acetaminophen (PERCOCET) 10-325 MG tablet Take 1 tablet by mouth every 8 (eight) hours as needed for pain (for migraines). 07/04/21  Yes Susy Frizzle, MD  Polyethyl Glycol-Propyl Glycol (SYSTANE OP) Apply 1 drop to eye daily as needed (dry eyes).   Yes [provider]  promethazine (PHENERGAN) 25 MG tablet TAKE 1 TABELT BY MOUTH EVERY 4 HOURS Patient taking differently: Take 25 mg by mouth every 4 (four) hours as needed for vomiting or nausea. 11/11/20  Yes Susy Frizzle, MD  sodium chloride (OCEAN) 0.65 % SOLN nasal spray Place 1 spray into both nostrils as needed for congestion.   Yes [provider]  nitrofurantoin, macrocrystal-monohydrate, (MACROBID) 100 MG capsule Take 100 mg by mouth See admin instructions. Bid x 10 days Patient not taking: Reported on 08/15/2021 08/14/21   [provider]  Physical Exam: Vitals:   08/15/21 0426 08/15/21 0545 08/15/21 0600 08/15/21 0700  BP: 128/83 115/76  118/86  Pulse: 97 (!) 110  98  Resp: '18 19  16  '$ Temp:      TempSrc:      SpO2: 98% 98%  99%  Weight:   83.5 kg   Height:   5' (1.524 m)      General:  Appears calm and comfortable and is in NAD Eyes:  PERRL, EOMI, normal lids, iris ENT:  grossly normal hearing, lips & tongue, mmm; appropriate dentition Neck:  no LAD, masses or thyromegaly Cardiovascular:  RRR, no m/r/g. No LE edema.  Respiratory:   CTA bilaterally with no wheezes/rales/rhonchi.  Normal respiratory effort. Abdomen:  soft, mildly TTP in lower region with generalized mild distention Skin:  no rash or induration seen on limited exam Musculoskeletal:  grossly normal tone BUE/BLE, good ROM, no bony abnormality Psychiatric:  grossly normal mood and affect, speech fluent and appropriate, AOx3 Neurologic:  CN 2-12 grossly intact, moves all extremities in coordinated fashion    Radiological Exams on Admission: Independently reviewed - see discussion in A/P where applicable  CT ABDOMEN PELVIS W CONTRAST  Result Date: 08/15/2021 CLINICAL DATA:  Left lower quadrant abdominal pain 10 9 and nausea. EXAM: CT ABDOMEN AND PELVIS WITH CONTRAST TECHNIQUE: Multidetector CT imaging of the abdomen and pelvis was performed using the standard protocol following bolus  administration of intravenous contrast. CONTRAST:  53m OMNIPAQUE IOHEXOL 350 MG/ML SOLN COMPARISON:  None. FINDINGS: Lower chest: No significant pulmonary nodules or acute consolidative airspace disease. Hepatobiliary: Normal liver size. No liver mass. Normal gallbladder with no radiopaque cholelithiasis. No biliary ductal dilatation. Pancreas: Normal, with no mass or duct dilation. Spleen: Normal size. No mass. Adrenals/Urinary Tract: Normal adrenals. Normal kidneys with no hydronephrosis and no renal mass. Normal bladder. Stomach/Bowel: Normal non-distended stomach. Normal caliber small bowel with no small bowel wall thickening. Appendectomy. Normal large bowel with no diverticulosis, large bowel wall thickening or pericolonic fat stranding. Vascular/Lymphatic: Normal caliber abdominal aorta. Patent portal, splenic, hepatic and renal veins. No pathologically enlarged lymph nodes in the abdomen or pelvis. Reproductive: Grossly normal uterus.  No adnexal mass. Other: No pneumoperitoneum, ascites or focal fluid collection. Musculoskeletal: No aggressive appearing focal osseous lesions. Mild lumbar spondylosis. IMPRESSION: No acute abnormality. No evidence of bowel obstruction or acute bowel inflammation. Electronically Signed   By: JIlona SorrelM.D.   On: 08/15/2021 05:42   DG Chest Portable 1 View  Result Date: 08/15/2021 CLINICAL DATA:  Shortness of breath and abdominal pain EXAM: PORTABLE CHEST 1 VIEW COMPARISON:  08/29/2019 FINDINGS: Normal heart size and mediastinal contours. No acute infiltrate or edema. No effusion or pneumothorax. No acute osseous findings. IMPRESSION: Stable chest.  No acute finding. Electronically Signed   By: JMonte FantasiaM.D.   On: 08/15/2021 04:40    EKG: Independently reviewed.  Sinus tachycardia with rate 109; nonspecific ST changes with no evidence of acute ischemia   Labs on Admission: I have personally reviewed the available labs and imaging studies at the time of the  admission.  Pertinent labs:   Glucose 183 HS troponin 4, 5 D-dimer 0.36 Lactate 1.9 WBC 21.7 Upreg negative UA: 50 glucose, small Hgb, large LE, few bacteria Urine culture pending   Assessment/Plan Principal Problem:   UTI (urinary tract infection) Active Problems:   Hyperglycemia   Class 2 obesity due to excess calories with body mass index (BMI) of 35.0 to 35.9 in adult  UTI -Patient presenting with lower abdominal and back pain with abnormal UA, suspicious for UTI -She was treated yesterday with Macrobid but did not tolerate well (added to allergy list) -She presented overnight with tachycardia and leukocytosis (+ SIRS criteria) but no apparent criteria to suggest sepsis -Blood and urine cultures are pending -She was given 1 dose of Cipro without difficulty; the bioavailability of fluoroquinolones is basically the same PO and IV so completion of the course PO should be appropriate -CT performed and does not show evidence of pyelo or pelvic pathology; if she has ongoing symptoms she has been encouraged to request a transvaginal US via her PCP or GYN -Will give an additional 500 cc bolus and then it appears to be reasonable to d/c to home from the ER  Hyperglycemia -Patient is obese and has a strong FH of DM -Glucose today was 183 -She reports that she ate 4 popsicles so perhaps it was related to that or to an acute phase response -Will check A1c -Needs outpatient f/u for possible new DM  Obesity -Body mass index is 35.94 kg/m..  -Weight loss should be encouraged -Outpatient PCP/bariatric medicine f/u encouraged      Note: This patient has been tested and is pending for the novel coronavirus COVID-19. She has NOT been vaccinated against COVID-19.    Based on the overall clinical stability of the patient, she does not appear to require further hospitalization at this time.  I discussed the option of ER d/c with the patient and she was in agreement with this plan.   I then discussed with Dr. Joya Gaskins who was also in agreement and who will d/c the patient to home after completion of her bolus.     Karmen Bongo MD [facility]   How to contact the [facility] Attending or Consulting provider [place] or covering provider during after hours [place], for this patient?  Check the care team in [facility] and look for a) attending/consulting [facility] provider listed and b) the [facility] team listed Log into [email or web address] and use [place]'s universal password to access. If you do not have the password, please contact the hospital operator. Locate the [facility] provider you are looking for under [facility] and page to a number that you can be directly reached. If you still have difficulty reaching the provider, please page the [facility] (Director on Call) for the Hospitalists listed on amion for assistance.   08/15/2021, 8:23 AM

## 2021-08-16 ENCOUNTER — Other Ambulatory Visit: Payer: Self-pay | Admitting: Family Medicine

## 2021-08-16 LAB — URINE CULTURE

## 2021-08-20 LAB — CULTURE, BLOOD (ROUTINE X 2)
Culture: NO GROWTH
Culture: NO GROWTH
Special Requests: ADEQUATE
Special Requests: ADEQUATE

## 2021-08-22 ENCOUNTER — Encounter: Payer: Self-pay | Admitting: Family Medicine

## 2021-08-27 ENCOUNTER — Encounter: Payer: Self-pay | Admitting: Family Medicine

## 2021-08-29 ENCOUNTER — Other Ambulatory Visit: Payer: Self-pay | Admitting: Family Medicine

## 2021-08-29 MED ORDER — OXYCODONE-ACETAMINOPHEN 10-325 MG PO TABS: 1.0000 | ORAL_TABLET | Freq: Three times a day (TID) | ORAL | 0 refills | Status: DC | PRN

## 2021-08-29 NOTE — Telephone Encounter (Signed)
Last OV 05/02/21, Janett Billow Last fill 07/04/2021

## 2021-09-06 ENCOUNTER — Other Ambulatory Visit: Payer: Self-pay | Admitting: Family Medicine

## 2021-09-25 ENCOUNTER — Other Ambulatory Visit: Payer: Self-pay | Admitting: Family Medicine

## 2021-10-04 ENCOUNTER — Telehealth: Payer: Self-pay | Admitting: Family Medicine

## 2021-10-04 NOTE — Telephone Encounter (Signed)
Outbound call placed to return patient's calls; suffering with migraines for past 2 days. Patient stated she's already had a biopsy and hsd a 2nd one scheduled for 11/9 to find out why she's bleeding between cycles. Patient also stated bleeding always accompanied by migraines.  No appointments available for today. Patient stated she's been taking oxycodone (1/2 of a pill) to manage pain but hasn't taken any today. Patient  will take an entire pill. If migraine doesn't subside, patient will go to urgent care or the ED.

## 2021-10-29 ENCOUNTER — Other Ambulatory Visit: Payer: Self-pay | Admitting: Family Medicine

## 2021-10-31 ENCOUNTER — Other Ambulatory Visit: Payer: Self-pay | Admitting: Family Medicine

## 2021-10-31 MED ORDER — OXYCODONE-ACETAMINOPHEN 10-325 MG PO TABS: 1.0000 | ORAL_TABLET | Freq: Three times a day (TID) | ORAL | 0 refills | Status: DC | PRN

## 2021-10-31 NOTE — Telephone Encounter (Signed)
PDMP reviewed.  Patient appears to take this medication chronically.  Refill given in place of PCP who is out of the office.

## 2021-11-10 HISTORY — PX: HYSTEROSCOPY: SHX211

## 2021-11-18 ENCOUNTER — Other Ambulatory Visit: Payer: Self-pay | Admitting: Family Medicine

## 2021-12-06 ENCOUNTER — Telehealth: Payer: Self-pay

## 2021-12-06 NOTE — Telephone Encounter (Signed)
[facility] pt requires a PA for Emgality. PA started on CoverMyMeds.

## 2021-12-07 ENCOUNTER — Encounter: Payer: Self-pay | Admitting: Family Medicine

## 2021-12-08 ENCOUNTER — Other Ambulatory Visit: Payer: Self-pay | Admitting: Family Medicine

## 2021-12-08 MED ORDER — PHENTERMINE HCL 37.5 MG PO TABS: 37.5000 mg | ORAL_TABLET | Freq: Every day | ORAL | 2 refills | Status: DC

## 2021-12-11 DIAGNOSIS — N841 Polyp of cervix uteri: Secondary | ICD-10-CM

## 2021-12-11 HISTORY — DX: Polyp of cervix uteri: N84.1

## 2021-12-21 ENCOUNTER — Other Ambulatory Visit: Payer: Self-pay | Admitting: Nurse Practitioner

## 2021-12-22 MED ORDER — OXYCODONE-ACETAMINOPHEN 10-325 MG PO TABS: 1.0000 | ORAL_TABLET | Freq: Three times a day (TID) | ORAL | 0 refills | Status: DC | PRN

## 2021-12-23 ENCOUNTER — Encounter: Payer: Self-pay | Admitting: Family Medicine

## 2021-12-23 ENCOUNTER — Other Ambulatory Visit: Payer: Self-pay

## 2021-12-23 ENCOUNTER — Ambulatory Visit (INDEPENDENT_AMBULATORY_CARE_PROVIDER_SITE_OTHER): Payer: BC Managed Care – PPO | Admitting: Family Medicine

## 2021-12-23 VITALS — BP 110/76 | HR 110 | Temp 98.1°F | Resp 18 | Ht 60.0 in | Wt 189.0 lb

## 2021-12-23 DIAGNOSIS — R000 Tachycardia, unspecified: Secondary | ICD-10-CM

## 2021-12-23 DIAGNOSIS — Z23 Encounter for immunization: Secondary | ICD-10-CM

## 2021-12-23 DIAGNOSIS — R5383 Other fatigue: Secondary | ICD-10-CM

## 2021-12-23 NOTE — Progress Notes (Signed)
Subjective:    Patient ID: [name], female    DOB: Oct 02, 1971, 51 y.o.   MRN: [id number] Patient presents today with fatigue.  She states that she feels extremely tired and weak.  She states that it happened suddenly.  She does have a history of B12 deficiency.  She would like to have her B12 levels checked.  She is also tachycardic.  She denies any chest pain shortness of breath or dyspnea on exertion.  However she has not had her thyroid checked in quite some time.  I am concerned about physical experiencing irregular vaginal bleeding due to an endometrial polyp.  To my knowledge no one has checked her hemoglobin recently.  She is also requesting a Tdap.  Her daughter just gave birth and she wants to protect her grandchildren against whooping cough. Past Medical History:  Diagnosis Date   Angiolipoma of kidney    Arthritis    "hands" (09/02/2013)   B12 deficiency    Epicondylitis, lateral    Right   Fibromyalgia    GERD (gastroesophageal reflux disease)    IBS (irritable bowel syndrome)    Migraine headache    "maybe 6-8 times/yr" (09/02/2013)   Past Surgical History:  Procedure Laterality Date   APPENDECTOMY  12/11/1982   HYSTEROSCOPY  11/2021   PALATAL EXPANSION  12/11/2002   REDUCTION MAMMAPLASTY Bilateral ~ 2009   Current Outpatient Medications on File Prior to Visit  Medication Sig Dispense Refill   acetaminophen (TYLENOL) 500 MG tablet Take 1,000 mg by mouth every 6 (six) hours as needed for moderate pain or headache.     albuterol (VENTOLIN HFA) 108 (90 Base) MCG/ACT inhaler INHALE 2 PUFFS INTO THE LUNGS EVERY 6 HOURS AS NEEDED FOR WHEEZE OR SHORTNESS OF BREATH 8.5 each 2   ALPRAZolam (XANAX) 0.5 MG tablet TAKE 1 TABLET BY MOUTH AT BEDTIME AS NEEDED FOR SLEEP OR ANXIETY 30 tablet 0   celecoxib (CELEBREX) 200 MG capsule Take 200 mg by mouth daily as needed for mild pain.     cyclobenzaprine (FLEXERIL) 10 MG tablet TAKE 1 TABLET BY MOUTH EVERY 8 HOURS AS NEEDED FOR  MUSCLE SPASM 60 tablet 1   diclofenac (VOLTAREN) 75 MG EC tablet TAKE 1 TABLET BY MOUTH TWICE A DAY 60 tablet 4   docusate sodium (COLACE) 100 MG capsule Take 200 mg by mouth daily as needed for mild constipation.     DULoxetine (CYMBALTA) 60 MG capsule TAKE 1 CAPSULE BY MOUTH EVERY DAY 90 capsule 1   EMGALITY 120 MG/ML SOAJ Inject 120 mg into the skin every 30 (thirty) days.     hydrOXYzine (VISTARIL) 25 MG capsule Take 1 capsule (25 mg total) by mouth every 8 (eight) hours as needed for anxiety. Take first dose at night time and monitor for drowsiness.  If this  Medication makes you drowsy, do not take while driving or operating heavy machinery. (Patient taking differently: Take 25 mg by mouth every 8 (eight) hours as needed for anxiety.) 30 capsule 0   LINZESS 290 MCG CAPS capsule TAKE 1 CAPSULE (290 MCG TOTAL) BY MOUTH DAILY BEFORE BREAKFAST. 30 capsule 3   norethindrone (MICRONOR) 0.35 MG tablet Take 1 tablet by mouth at bedtime.     oxyCODONE-acetaminophen (PERCOCET) 10-325 MG tablet Take 1 tablet by mouth every 8 (eight) hours as needed for pain (for migraines). 30 tablet 0   phentermine (ADIPEX-P) 37.5 MG tablet Take 1 tablet (37.5 mg total) by mouth daily before breakfast. 30 tablet  2   Polyethyl Glycol-Propyl Glycol (SYSTANE OP) Apply 1 drop to eye daily as needed (dry eyes).     promethazine (PHENERGAN) 25 MG tablet TAKE 1 TABELT BY MOUTH EVERY 4 HOURS 20 tablet 1   sodium chloride (OCEAN) 0.65 % SOLN nasal spray Place 1 spray into both nostrils as needed for congestion.     Current Facility-Administered Medications on File Prior to Visit  Medication Dose Route Frequency Provider Last Rate Last Admin   betamethasone acetate-betamethasone sodium phosphate (CELESTONE) injection 3 mg  3 mg Intra-articular Once Edrick Kins, DPM       Allergies  Allergen Reactions   Cefdinir Hives   Prednisone Rash     Only when takes in large doses   Macrolides And Ketolides     Headache,shortness  of breath, heavy chest   Ceftin [Cefuroxime Axetil] Rash   Sulfa Antibiotics Rash   Social History   Socioeconomic History   Marital status: Married    Spouse name: Not on file   Number of children: Not on file   Years of education: Not on file   Highest education level: Not on file  Occupational History   Occupation: kindergarten teacher  Tobacco Use   Smoking status: Never   Smokeless tobacco: Never  Vaping Use   Vaping Use: Never used  Substance and Sexual Activity   Alcohol use: No   Drug use: No   Sexual activity: Not Currently  Other Topics Concern   Not on file  Social History Narrative   Not on file   Social Determinants of Health   Financial Resource Strain: Not on file  Food Insecurity: Not on file  Transportation Needs: Not on file  Physical Activity: Not on file  Stress: Not on file  Social Connections: Not on file  Intimate Partner Violence: Not on file      Review of Systems  All other systems reviewed and are negative.     Objective:   Physical Exam Vitals reviewed.  Constitutional:      General: She is not in acute distress.    Appearance: She is normal weight. She is not ill-appearing, toxic-appearing or diaphoretic.  Cardiovascular:     Rate and Rhythm: Normal rate and regular rhythm.     Heart sounds: Normal heart sounds. No murmur heard.   No friction rub. No gallop.  Pulmonary:     Effort: Pulmonary effort is normal. No respiratory distress.     Breath sounds: Normal breath sounds. No wheezing or rales.  Abdominal:     General: Abdomen is flat. Bowel sounds are normal.  Neurological:     Mental Status: She is alert.          Assessment & Plan:  Fatigue, unspecified type - Plan: CBC with Differential/Platelet, COMPLETE METABOLIC PANEL WITH GFR, Vitamin B12, Iron, TSH  Tachycardia - Plan: CBC with Differential/Platelet, COMPLETE METABOLIC PANEL WITH GFR, Vitamin B12, Iron, TSH I am concerned the tachycardia may be due to  phentermine.  I recommended that we discontinue that.  Check CBC CMP B12 and iron and TSH.  Mother concerned that she may be anemic due to vaginal bleeding and iron deficiency anemia.  Await results of the lab work.  Instead of phentermine, I think the patient would be an excellent candidate for Wegovy if her insurance will cover it.  She will check with her insurance.

## 2021-12-23 NOTE — Addendum Note (Signed)
Addended by: Jaynie Crumble on: 12/23/2021 01:09 PM   Modules accepted: Orders

## 2021-12-24 LAB — COMPLETE METABOLIC PANEL WITH GFR
AG Ratio: 1.7 (calc) (ref 1.0–2.5)
ALT: 13 U/L (ref 6–29)
AST: 13 U/L (ref 10–35)
Albumin: 4 g/dL (ref 3.6–5.1)
Alkaline phosphatase (APISO): 84 U/L (ref 37–153)
BUN: 12 mg/dL (ref 7–25)
CO2: 25 mmol/L (ref 20–32)
Calcium: 9.2 mg/dL (ref 8.6–10.4)
Chloride: 106 mmol/L (ref 98–110)
Creat: 0.79 mg/dL (ref 0.50–1.03)
Globulin: 2.4 g/dL (calc) (ref 1.9–3.7)
Glucose, Bld: 125 mg/dL — ABNORMAL HIGH (ref 65–99)
Potassium: 4 mmol/L (ref 3.5–5.3)
Sodium: 139 mmol/L (ref 135–146)
Total Bilirubin: 0.2 mg/dL (ref 0.2–1.2)
Total Protein: 6.4 g/dL (ref 6.1–8.1)
eGFR: 91 mL/min/{1.73_m2} (ref >=60)

## 2021-12-24 LAB — CBC WITH DIFFERENTIAL/PLATELET
Absolute Monocytes: 387 cells/uL (ref 200–950)
Basophils Absolute: 54 cells/uL (ref 0–200)
Basophils Relative: 0.6 %
Eosinophils Absolute: 108 cells/uL (ref 15–500)
Eosinophils Relative: 1.2 %
HCT: 39.8 % (ref 35.0–45.0)
Hemoglobin: 12.4 g/dL (ref 11.7–15.5)
Lymphs Abs: 1710 cells/uL (ref 850–3900)
MCH: 24.3 pg — ABNORMAL LOW (ref 27.0–33.0)
MCHC: 31.2 g/dL — ABNORMAL LOW (ref 32.0–36.0)
MCV: 77.9 fL — ABNORMAL LOW (ref 80.0–100.0)
MPV: 11 fL (ref 7.5–12.5)
Monocytes Relative: 4.3 %
Neutro Abs: 6741 cells/uL (ref 1500–7800)
Neutrophils Relative %: 74.9 %
Platelets: 312 10*3/uL (ref 140–400)
RBC: 5.11 10*6/uL — ABNORMAL HIGH (ref 3.80–5.10)
RDW: 13.5 % (ref 11.0–15.0)
Total Lymphocyte: 19 %
WBC: 9 10*3/uL (ref 3.8–10.8)

## 2021-12-24 LAB — TSH: TSH: 0.27 mIU/L — ABNORMAL LOW

## 2021-12-24 LAB — VITAMIN B12: Vitamin B-12: 580 pg/mL (ref 200–1100)

## 2021-12-24 LAB — IRON: Iron: 18 ug/dL — ABNORMAL LOW (ref 45–160)

## 2021-12-28 ENCOUNTER — Encounter: Payer: Self-pay | Admitting: Family Medicine

## 2021-12-28 DIAGNOSIS — Z6835 Body mass index (BMI) 35.0-35.9, adult: Secondary | ICD-10-CM

## 2021-12-28 DIAGNOSIS — E6609 Other obesity due to excess calories: Secondary | ICD-10-CM

## 2021-12-28 NOTE — Telephone Encounter (Signed)
Please advise on rx for [facility]. Thanks!

## 2021-12-29 MED ORDER — WEGOVY 0.25 MG/0.5ML ~~LOC~~ SOAJ: 0.2500 mg | SUBCUTANEOUS | 0 refills | Status: AC

## 2021-12-29 NOTE — Telephone Encounter (Signed)
Rx sent to pharmacy   

## 2021-12-30 ENCOUNTER — Other Ambulatory Visit: Payer: Self-pay

## 2021-12-30 DIAGNOSIS — R002 Palpitations: Secondary | ICD-10-CM

## 2022-01-04 ENCOUNTER — Other Ambulatory Visit: Payer: BC Managed Care – PPO

## 2022-01-04 ENCOUNTER — Other Ambulatory Visit: Payer: Self-pay

## 2022-01-04 DIAGNOSIS — R002 Palpitations: Secondary | ICD-10-CM

## 2022-01-04 LAB — TSH: TSH: 0.41 mIU/L

## 2022-01-04 LAB — T3, FREE: T3, Free: 2.7 pg/mL (ref 2.3–4.2)

## 2022-01-04 LAB — T4, FREE: Free T4: 1 ng/dL (ref 0.8–1.8)

## 2022-01-06 NOTE — Telephone Encounter (Signed)
PA received via CoverMyMeds and has been completed and sent to plan. Awaiting response.

## 2022-01-09 ENCOUNTER — Other Ambulatory Visit: Payer: Self-pay | Admitting: Family Medicine

## 2022-01-09 NOTE — Telephone Encounter (Signed)
Your prior authorization for Mancel Parsons has been approved! Message from plan: Your PA request has been approved. Additional information will be provided in the approval communication.

## 2022-01-09 NOTE — Telephone Encounter (Signed)
LOV 12/23/21 Last refill 11/18/21, #30, 0 refills   Please review, thanks!

## 2022-01-16 ENCOUNTER — Encounter: Payer: Self-pay | Admitting: Family Medicine

## 2022-02-17 ENCOUNTER — Other Ambulatory Visit: Payer: Self-pay | Admitting: Family Medicine

## 2022-02-17 MED ORDER — OXYCODONE-ACETAMINOPHEN 10-325 MG PO TABS: 1.0000 | ORAL_TABLET | Freq: Three times a day (TID) | ORAL | 0 refills | Status: DC | PRN

## 2022-02-17 NOTE — Telephone Encounter (Signed)
LOV 12/23/21 ?Last refill 12/22/21, #30, 0 refills ? ?Please review, thanks! ? ?

## 2022-02-28 ENCOUNTER — Other Ambulatory Visit: Payer: Self-pay | Admitting: Family Medicine

## 2022-03-01 NOTE — Telephone Encounter (Signed)
LOV 12/23/21 ?Last refill 01/09/22, #30, 0 refills ? ?Please review, thanks!  ?

## 2022-03-02 ENCOUNTER — Other Ambulatory Visit: Payer: Self-pay | Admitting: Family Medicine

## 2022-03-03 NOTE — Telephone Encounter (Signed)
PA for Emgality was APPROVED ?12/06/21 ? ?[facility] ? received a request from your provider for coverage of Emgality ?'120MG'$ /ML [place] SOAJ. ?As long as you remain covered by the [facility] and there are no changes to ?your plan benefits, this request is approved for the following time period: ?12/06/2021 - 12/06/2022 ?Approvals may be subject to dosing limits in accordance with FDA approved labeling, ?evidence-based practice guidelines or your prescription drug plan benefits. ?

## 2022-04-03 ENCOUNTER — Other Ambulatory Visit: Payer: Self-pay | Admitting: Family Medicine

## 2022-04-03 NOTE — Telephone Encounter (Signed)
LOV 12/23/21 ?Last refill 03/02/22, #30, 0 refills ? ?Please review, thanks! ? ?

## 2022-04-21 ENCOUNTER — Other Ambulatory Visit: Payer: Self-pay | Admitting: Family Medicine

## 2022-04-21 MED ORDER — OXYCODONE-ACETAMINOPHEN 10-325 MG PO TABS: 1.0000 | ORAL_TABLET | Freq: Three times a day (TID) | ORAL | 0 refills | Status: DC | PRN

## 2022-04-21 NOTE — Telephone Encounter (Signed)
LOV 12/23/21 ?Last refill 02/17/22, #30, 0 refills ? ?Please review, thanks! ? ?

## 2022-04-27 ENCOUNTER — Other Ambulatory Visit: Payer: Self-pay | Admitting: Family Medicine

## 2022-04-28 NOTE — Telephone Encounter (Signed)
Requested medications are due for refill today.  yes  Requested medications are on the active medications list.  yes  Last refill. 01/09/2022 #60 1 refill  Future visit scheduled.   no  Notes to clinic.  Medication refill is not delegated.    Requested Prescriptions  Pending Prescriptions Disp Refills   cyclobenzaprine (FLEXERIL) 10 MG tablet [Pharmacy Med Name: CYCLOBENZAPRINE 10 MG TABLET] 60 tablet 1    Sig: TAKE 1 TABLET BY MOUTH EVERY 8 HOURS AS NEEDED FOR MUSCLE SPASM     Not Delegated - Analgesics:  Muscle Relaxants Failed - 04/27/2022  5:02 PM      Failed - This refill cannot be delegated      Passed - Valid encounter within last 6 months    Recent Outpatient Visits           4 months ago Fatigue, unspecified type   [place] Susy Frizzle, MD   12 months ago [place] Eulogio Bear, NP   1 year ago [place] Eulogio Bear, NP   1 year ago Neck pain   [place] Dennard Schaumann, Cammie Mcgee, MD   1 year ago [place] Pickard, Cammie Mcgee, MD

## 2022-05-17 ENCOUNTER — Other Ambulatory Visit: Payer: Self-pay | Admitting: Family Medicine

## 2022-05-17 NOTE — Telephone Encounter (Signed)
Requested medication (s) are due for refill today: yes  Requested medication (s) are on the active medication list: yes  Last refill:  04/03/22 #30/0  Future visit scheduled: no  Notes to clinic:  Unable to refill per protocol, cannot delegate.    Requested Prescriptions  Pending Prescriptions Disp Refills   ALPRAZolam (XANAX) 0.5 MG tablet [Pharmacy Med Name: ALPRAZOLAM 0.5 MG TABLET] 30 tablet 0    Sig: TAKE 1 TABLET BY MOUTH AT BEDTIME AS NEEDED FOR SLEEP OR ANXIETY     Not Delegated - Psychiatry: Anxiolytics/Hypnotics 2 Failed - 05/17/2022  3:14 PM      Failed - This refill cannot be delegated      Failed - Urine Drug Screen completed in last 360 days      Passed - Patient is not pregnant      Passed - Valid encounter within last 6 months    Recent Outpatient Visits           4 months ago Fatigue, unspecified type   [place] Susy Frizzle, MD   1 year ago Palpitations   [place], Jessica A, NP   1 year ago [place] Eulogio Bear, NP   1 year ago Neck pain   [place] Dennard Schaumann, Cammie Mcgee, MD   1 year ago [place] Pickard, Cammie Mcgee, MD

## 2022-05-17 NOTE — Telephone Encounter (Signed)
Last OV 12/23/2021

## 2022-06-05 ENCOUNTER — Encounter: Payer: Self-pay | Admitting: Family Medicine

## 2022-06-05 HISTORY — PX: COLONOSCOPY: SHX174

## 2022-06-08 ENCOUNTER — Encounter: Payer: Self-pay | Admitting: Family Medicine

## 2022-06-08 ENCOUNTER — Ambulatory Visit (INDEPENDENT_AMBULATORY_CARE_PROVIDER_SITE_OTHER): Payer: BC Managed Care – PPO | Admitting: Family Medicine

## 2022-06-08 VITALS — BP 122/92 | HR 105 | Temp 99.5°F | Ht 61.0 in | Wt 189.0 lb

## 2022-06-08 DIAGNOSIS — N841 Polyp of cervix uteri: Secondary | ICD-10-CM

## 2022-06-08 NOTE — Progress Notes (Signed)
Subjective:    [name] ID: Andrea [name], female    DOB: 1971-03-01, 51 y.o.   MRN: [id number]   [name] is a very pleasant 51 year old Caucasian female who presents today for surgical clearance.  Her gynecologist has discovered polyps on her cervix.  They have recommended a hysterectomy.  They are awaiting surgical clearance to proceed.  [name] has no history of coronary artery disease.  She has no history of congestive heart failure.  She has no history of renal insufficiency or chronic kidney disease.  She does have a history of mild anemia that was discovered in January however she denies any lightheadedness or near syncope.  She denies any cough or pleurisy.  She denies any angina.  She denies any fevers or chills or abdominal pain.  Blood pressure today is acceptable at 122/92. Past Medical History:  Diagnosis Date   Angiolipoma of kidney    Arthritis    "hands" (09/02/2013)   B12 deficiency    Epicondylitis, lateral    Right   Fibromyalgia    GERD (gastroesophageal reflux disease)    IBS (irritable bowel syndrome)    Migraine headache    "maybe 6-8 times/yr" (09/02/2013)   Past Surgical History:  Procedure Laterality Date   APPENDECTOMY  12/11/1982   HYSTEROSCOPY  11/2021   PALATAL EXPANSION  12/11/2002   REDUCTION MAMMAPLASTY Bilateral ~ 2009   Current Outpatient Medications on File Prior to Visit  Medication Sig Dispense Refill   acetaminophen (TYLENOL) 500 MG tablet Take 1,000 mg by mouth every 6 (six) hours as needed for moderate pain or headache.     albuterol (VENTOLIN HFA) 108 (90 Base) MCG/ACT inhaler INHALE 2 PUFFS INTO THE LUNGS EVERY 6 HOURS AS NEEDED FOR WHEEZE OR SHORTNESS OF BREATH 8.5 each 2   ALPRAZolam (XANAX) 0.5 MG tablet TAKE 1 TABLET BY MOUTH AT BEDTIME AS NEEDED FOR SLEEP OR ANXIETY 30 tablet 0   celecoxib (CELEBREX) 200 MG capsule Take 200 mg by mouth daily as needed for mild pain.     cyclobenzaprine (FLEXERIL) 10 MG tablet TAKE 1 TABLET BY  MOUTH EVERY 8 HOURS AS NEEDED FOR MUSCLE SPASM 60 tablet 1   diclofenac (VOLTAREN) 75 MG EC tablet TAKE 1 TABLET BY MOUTH TWICE A DAY 60 tablet 4   docusate sodium (COLACE) 100 MG capsule Take 200 mg by mouth daily as needed for mild constipation.     DULoxetine (CYMBALTA) 60 MG capsule TAKE 1 CAPSULE BY MOUTH EVERY DAY 90 capsule 1   EMGALITY 120 MG/ML SOAJ INJECT 120 MG INTO THE SKIN EVERY 30 (THIRTY) DAYS. 1 mL 11   LINZESS 290 MCG CAPS capsule TAKE 1 CAPSULE (290 MCG TOTAL) BY MOUTH DAILY BEFORE BREAKFAST. 30 capsule 3   oxyCODONE-acetaminophen (PERCOCET) 10-325 MG tablet Take 1 tablet by mouth every 8 (eight) hours as needed for pain (for migraines). 30 tablet 0   phentermine (ADIPEX-P) 37.5 MG tablet Take 1 tablet (37.5 mg total) by mouth daily before breakfast. 30 tablet 2   Polyethyl Glycol-Propyl Glycol (SYSTANE OP) Apply 1 drop to eye daily as needed (dry eyes).     promethazine (PHENERGAN) 25 MG tablet TAKE 1 TABELT BY MOUTH EVERY 4 HOURS 20 tablet 1   sodium chloride (OCEAN) 0.65 % SOLN nasal spray Place 1 spray into both nostrils as needed for congestion.     hydrOXYzine (VISTARIL) 25 MG capsule Take 1 capsule (25 mg total) by mouth every 8 (eight) hours as needed for anxiety.  Take first dose at night time and monitor for drowsiness.  If this  Medication makes you drowsy, do not take while driving or operating heavy machinery. ([name] not taking: Reported on 06/08/2022) 30 capsule 0   norethindrone (MICRONOR) 0.35 MG tablet Take 1 tablet by mouth at bedtime. ([name] not taking: Reported on 06/08/2022)     Current Facility-Administered Medications on File Prior to Visit  Medication Dose Route Frequency Provider Last Rate Last Admin   betamethasone acetate-betamethasone sodium phosphate (CELESTONE) injection 3 mg  3 mg Intra-articular Once Edrick Kins, DPM       Allergies  Allergen Reactions   Cefdinir Hives   Prednisone Rash     Only when takes in large doses   Macrolides And  Ketolides     Headache,shortness of breath, heavy chest   Ceftin [Cefuroxime Axetil] Rash   Sulfa Antibiotics Rash   Social History   Socioeconomic History   Marital status: Married    Spouse name: Not on file   Number of children: Not on file   Years of education: Not on file   Highest education level: Not on file  Occupational History   Occupation: kindergarten teacher  Tobacco Use   Smoking status: Never   Smokeless tobacco: Never  Vaping Use   Vaping Use: Never used  Substance and Sexual Activity   Alcohol use: No   Drug use: No   Sexual activity: Not Currently  Other Topics Concern   Not on file  Social History Narrative   Not on file   Social Determinants of Health   Financial Resource Strain: Not on file  Food Insecurity: Not on file  Transportation Needs: Not on file  Physical Activity: Not on file  Stress: Not on file  Social Connections: Not on file  Intimate Partner Violence: Not on file      Review of Systems  All other systems reviewed and are negative.      Objective:   Physical Exam Vitals reviewed.  Constitutional:      General: She is not in acute distress.    Appearance: She is normal weight. She is not ill-appearing, toxic-appearing or diaphoretic.  Cardiovascular:     Rate and Rhythm: Normal rate and regular rhythm.     Heart sounds: Normal heart sounds. No murmur heard.    No friction rub. No gallop.  Pulmonary:     Effort: Pulmonary effort is normal. No respiratory distress.     Breath sounds: Normal breath sounds. No wheezing or rales.  Abdominal:     General: Abdomen is flat. Bowel sounds are normal.  Neurological:     Mental Status: She is alert.           Assessment & Plan:  Cervical polyp - Plan: CBC with Differential/Platelet, COMPLETE METABOLIC PANEL WITH GFR Clinically, there are no contraindications to her upcoming surgery.  I will check a CBC to rule out severe anemia or any electrolyte disturbances.  If her lab  work is normal, [name] is medically cleared to proceed with surgery.

## 2022-06-09 LAB — CBC WITH DIFFERENTIAL/PLATELET
Absolute Monocytes: 590 cells/uL (ref 200–950)
Basophils Absolute: 24 cells/uL (ref 0–200)
Basophils Relative: 0.1 %
Eosinophils Absolute: 0 cells/uL — ABNORMAL LOW (ref 15–500)
Eosinophils Relative: 0 %
HCT: 41 % (ref 35.0–45.0)
Hemoglobin: 13.2 g/dL (ref 11.7–15.5)
Lymphs Abs: 991 cells/uL (ref 850–3900)
MCH: 24.7 pg — ABNORMAL LOW (ref 27.0–33.0)
MCHC: 32.2 g/dL (ref 32.0–36.0)
MCV: 76.8 fL — ABNORMAL LOW (ref 80.0–100.0)
MPV: 11.1 fL (ref 7.5–12.5)
Monocytes Relative: 2.5 %
Neutro Abs: 21995 cells/uL — ABNORMAL HIGH (ref 1500–7800)
Neutrophils Relative %: 93.2 %
Platelets: 341 10*3/uL (ref 140–400)
RBC: 5.34 10*6/uL — ABNORMAL HIGH (ref 3.80–5.10)
RDW: 13.7 % (ref 11.0–15.0)
Total Lymphocyte: 4.2 %
WBC: 23.6 10*3/uL — ABNORMAL HIGH (ref 3.8–10.8)

## 2022-06-09 LAB — COMPLETE METABOLIC PANEL WITH GFR
AG Ratio: 1.6 (calc) (ref 1.0–2.5)
ALT: 14 U/L (ref 6–29)
AST: 16 U/L (ref 10–35)
Albumin: 4.5 g/dL (ref 3.6–5.1)
Alkaline phosphatase (APISO): 85 U/L (ref 37–153)
BUN: 14 mg/dL (ref 7–25)
CO2: 24 mmol/L (ref 20–32)
Calcium: 10 mg/dL (ref 8.6–10.4)
Chloride: 107 mmol/L (ref 98–110)
Creat: 0.8 mg/dL (ref 0.50–1.03)
Globulin: 2.9 g/dL (calc) (ref 1.9–3.7)
Glucose, Bld: 103 mg/dL — ABNORMAL HIGH (ref 65–99)
Potassium: 4.6 mmol/L (ref 3.5–5.3)
Sodium: 140 mmol/L (ref 135–146)
Total Bilirubin: 0.4 mg/dL (ref 0.2–1.2)
Total Protein: 7.4 g/dL (ref 6.1–8.1)
eGFR: 89 mL/min/{1.73_m2} (ref >=60)

## 2022-06-10 DIAGNOSIS — R002 Palpitations: Secondary | ICD-10-CM

## 2022-06-10 HISTORY — DX: Palpitations: R00.2

## 2022-06-12 ENCOUNTER — Telehealth: Payer: Self-pay

## 2022-06-12 ENCOUNTER — Other Ambulatory Visit: Payer: Self-pay

## 2022-06-12 NOTE — Telephone Encounter (Signed)
Pt called re lab results w/high WBC? What should pt do, per pt heart still feels like its racing as it was in last week's OV.   Pt has Lab appt on Monday, 06/19/22 for UA, blood cultures to be drawn.   Please advice?

## 2022-06-12 NOTE — Telephone Encounter (Signed)
Will have to wait for blood cultures to come back. If she is not symptomatic of any infection could just bbe something viral.

## 2022-06-12 NOTE — Telephone Encounter (Signed)
06/07/22 PA was denied.   06/12/22 spoke with pt, per pt does not need the meds anymore, to void it out.   Did not appeal per pt

## 2022-06-13 ENCOUNTER — Encounter (HOSPITAL_COMMUNITY): Payer: Self-pay

## 2022-06-13 ENCOUNTER — Emergency Department (HOSPITAL_COMMUNITY): Payer: BC Managed Care – PPO

## 2022-06-13 ENCOUNTER — Emergency Department (HOSPITAL_COMMUNITY)
Admission: EM | Admit: 2022-06-13 | Discharge: 2022-06-13 | Disposition: A | Discharge status: Home / Self Care | Payer: BC Managed Care – PPO | Attending: Emergency Medicine | Admitting: Emergency Medicine

## 2022-06-13 ENCOUNTER — Other Ambulatory Visit: Payer: Self-pay | Admitting: Family Medicine

## 2022-06-13 ENCOUNTER — Ambulatory Visit (HOSPITAL_COMMUNITY)
Admission: EM | Admit: 2022-06-13 | Discharge: 2022-06-13 | Disposition: A | Discharge status: Home / Self Care | Payer: BC Managed Care – PPO | Attending: Internal Medicine | Admitting: Internal Medicine

## 2022-06-13 ENCOUNTER — Other Ambulatory Visit: Payer: Self-pay

## 2022-06-13 ENCOUNTER — Encounter: Payer: Self-pay | Admitting: Family Medicine

## 2022-06-13 DIAGNOSIS — R61 Generalized hyperhidrosis: Secondary | ICD-10-CM | POA: Insufficient documentation

## 2022-06-13 DIAGNOSIS — D72829 Elevated white blood cell count, unspecified: Secondary | ICD-10-CM

## 2022-06-13 DIAGNOSIS — R5383 Other fatigue: Secondary | ICD-10-CM | POA: Insufficient documentation

## 2022-06-13 DIAGNOSIS — R002 Palpitations: Secondary | ICD-10-CM | POA: Diagnosis not present

## 2022-06-13 DIAGNOSIS — M791 Myalgia, unspecified site: Secondary | ICD-10-CM | POA: Diagnosis not present

## 2022-06-13 DIAGNOSIS — R509 Fever, unspecified: Secondary | ICD-10-CM | POA: Insufficient documentation

## 2022-06-13 DIAGNOSIS — R1032 Left lower quadrant pain: Secondary | ICD-10-CM | POA: Diagnosis not present

## 2022-06-13 LAB — URINALYSIS, ROUTINE W REFLEX MICROSCOPIC
Bilirubin Urine: NEGATIVE
Glucose, UA: NEGATIVE mg/dL
Hgb urine dipstick: NEGATIVE
Ketones, ur: NEGATIVE mg/dL
Nitrite: NEGATIVE
Protein, ur: NEGATIVE mg/dL
Specific Gravity, Urine: 1.018 (ref 1.005–1.030)
pH: 6 (ref 5.0–8.0)

## 2022-06-13 LAB — BASIC METABOLIC PANEL
Anion gap: 10 (ref 5–15)
BUN: 19 mg/dL (ref 6–20)
CO2: 25 mmol/L (ref 22–32)
Calcium: 9.4 mg/dL (ref 8.9–10.3)
Chloride: 104 mmol/L (ref 98–111)
Creatinine, Ser: 0.86 mg/dL (ref 0.44–1.00)
GFR, Estimated: >60 mL/min (ref >60)
Glucose, Bld: 105 mg/dL — ABNORMAL HIGH (ref 70–99)
Potassium: 4 mmol/L (ref 3.5–5.1)
Sodium: 139 mmol/L (ref 135–145)

## 2022-06-13 LAB — CBC
HCT: 45.8 % (ref 36.0–46.0)
Hemoglobin: 14 g/dL (ref 12.0–15.0)
MCH: 24.3 pg — ABNORMAL LOW (ref 26.0–34.0)
MCHC: 30.6 g/dL (ref 30.0–36.0)
MCV: 79.4 fL — ABNORMAL LOW (ref 80.0–100.0)
Platelets: 416 10*3/uL — ABNORMAL HIGH (ref 150–400)
RBC: 5.77 MIL/uL — ABNORMAL HIGH (ref 3.87–5.11)
RDW: 14.7 % (ref 11.5–15.5)
WBC: 17.4 10*3/uL — ABNORMAL HIGH (ref 4.0–10.5)
nRBC: 0 % (ref 0.0–0.2)

## 2022-06-13 LAB — LACTIC ACID, PLASMA: Lactic Acid, Venous: 1.5 mmol/L (ref 0.5–1.9)

## 2022-06-13 LAB — I-STAT BETA HCG BLOOD, ED (MC, WL, AP ONLY): I-stat hCG, quantitative: <5 m[IU]/mL (ref <5)

## 2022-06-13 LAB — TROPONIN I (HIGH SENSITIVITY)
Troponin I (High Sensitivity): 3 ng/L (ref <18)
Troponin I (High Sensitivity): 3 ng/L (ref <18)

## 2022-06-13 MED ORDER — LEVOFLOXACIN 500 MG PO TABS: 500.0000 mg | ORAL_TABLET | Freq: Every day | ORAL | 0 refills | Status: DC

## 2022-06-13 MED ORDER — ONDANSETRON HCL 4 MG/2ML IJ SOLN
4.0000 mg | Freq: Once | INTRAMUSCULAR | Status: AC
Start: 2022-06-13 — End: 2022-06-13
  Administered 2022-06-13: 4 mg via INTRAVENOUS
  Filled 2022-06-13: qty 2

## 2022-06-13 MED ORDER — IOHEXOL 300 MG/ML  SOLN
100.0000 mL | Freq: Once | INTRAMUSCULAR | Status: AC | PRN
  Administered 2022-06-13: 100 mL via INTRAVENOUS

## 2022-06-13 MED ORDER — SODIUM CHLORIDE 0.9 % IV BOLUS
1000.0000 mL | Freq: Once | INTRAVENOUS | Status: AC
Start: 2022-06-13 — End: 2022-06-13
  Administered 2022-06-13: 1000 mL via INTRAVENOUS

## 2022-06-13 NOTE — ED Notes (Signed)
Patient is being discharged from the Urgent Care and sent to the Emergency Department via private vehicle . Per Towana Badger FNP, patient is in need of higher level of care due to fever. Patient is aware and verbalizes understanding of plan of care.  Vitals:   06/13/22 0834  BP: 131/88  Pulse: (!) 104  Resp: 16  Temp: 98.1 F (36.7 C)  SpO2: 98%

## 2022-06-13 NOTE — ED Triage Notes (Signed)
Pt states she thinks she has an infection. States she had blood work last week and it was abnormal.  States for the past 5 days she has been having fever,heart racing and fatigue. States she has an appointment with her PMD next Monday but doesn't want to wait that long to see someone.

## 2022-06-13 NOTE — ED Provider Notes (Signed)
[facility] EMERGENCY DEPARTMENT Provider Note   CSN: [id number] Arrival date & time: 06/13/22  [id number]     History  Chief Complaint  Patient presents with   Palpitations    Andrea [name] is a 51 y.o. female.  51 year old female with prior medical history as detailed below presents for evaluation.  Patient reports that 8 days ago she had a colonoscopy with Dr. Collene Mares.  Patient reports that a polyp was removed.  Patient reports that she had to have her colon irrigated to complete her bowel prep.  Patient complains of persistent left lower quadrant abdominal discomfort after this procedure.  Patient reports that for the last 4 to 5 days she has had heart racing, fatigue, myalgias, and subjective fevers with diaphoresis at home.  Patient denies upper respiratory symptoms including cough congestion sore throat.  Patient denies urinary symptoms.  Patient denies significant change in bowel movement.  Patient was seen late last week by her PCP for same symptoms.  Patient's white count was significantly elevated to 25,000.  Patient was seen by urgent care earlier today and referred to the ED for further work-up of possible infection.  The history is provided by the patient and medical records.  Illness Location:  Myalgia, fatigue, subjective fever, palpitations, diaphoresis, recent colonoscopy Severity:  Moderate Onset quality:  Gradual Duration:  6 days Timing:  Rare Progression:  Waxing and waning Chronicity:  New      Home Medications Prior to Admission medications   Medication Sig Start Date End Date Taking? Authorizing Provider  acetaminophen (TYLENOL) 500 MG tablet Take 1,000 mg by mouth every 6 (six) hours as needed for moderate pain or headache.    [provider]  albuterol (VENTOLIN HFA) 108 (90 Base) MCG/ACT inhaler INHALE 2 PUFFS INTO THE LUNGS EVERY 6 HOURS AS NEEDED FOR WHEEZE OR SHORTNESS OF BREATH 08/19/21   Susy Frizzle, MD   ALPRAZolam Duanne Moron) 0.5 MG tablet TAKE 1 TABLET BY MOUTH AT BEDTIME AS NEEDED FOR SLEEP OR ANXIETY 05/21/22   Susy Frizzle, MD  celecoxib (CELEBREX) 200 MG capsule Take 200 mg by mouth daily as needed for mild pain. 06/25/21   [provider]  cyclobenzaprine (FLEXERIL) 10 MG tablet TAKE 1 TABLET BY MOUTH EVERY 8 HOURS AS NEEDED FOR MUSCLE SPASM 04/28/22   Susy Frizzle, MD  diclofenac (VOLTAREN) 75 MG EC tablet TAKE 1 TABLET BY MOUTH TWICE A DAY 10/31/21   Susy Frizzle, MD  docusate sodium (COLACE) 100 MG capsule Take 200 mg by mouth daily as needed for mild constipation.    [provider]  DULoxetine (CYMBALTA) 60 MG capsule TAKE 1 CAPSULE BY MOUTH EVERY DAY 09/06/21   Susy Frizzle, MD  EMGALITY 120 MG/ML SOAJ INJECT 120 MG INTO THE SKIN EVERY 30 (THIRTY) DAYS. 03/03/22   Susy Frizzle, MD  hydrOXYzine (VISTARIL) 25 MG capsule Take 1 capsule (25 mg total) by mouth every 8 (eight) hours as needed for anxiety. Take first dose at night time and monitor for drowsiness.  If this  Medication makes you drowsy, do not take while driving or operating heavy machinery. Patient not taking: Reported on 06/08/2022 05/02/21   Eulogio Bear, NP  LINZESS 290 MCG CAPS capsule TAKE 1 CAPSULE (290 MCG TOTAL) BY MOUTH DAILY BEFORE BREAKFAST. 09/06/21   Susy Frizzle, MD  norethindrone (MICRONOR) 0.35 MG tablet Take 1 tablet by mouth at bedtime. Patient not taking: Reported on 06/08/2022  [provider]  oxyCODONE-acetaminophen (PERCOCET) 10-325 MG tablet Take 1 tablet by mouth every 8 (eight) hours as needed for pain (for migraines). 04/21/22   Susy Frizzle, MD  phentermine (ADIPEX-P) 37.5 MG tablet Take 1 tablet (37.5 mg total) by mouth daily before breakfast. 12/08/21   Susy Frizzle, MD  Polyethyl Glycol-Propyl Glycol (SYSTANE OP) Apply 1 drop to eye daily as needed (dry eyes).    [provider]  promethazine (PHENERGAN) 25 MG tablet TAKE 1  TABELT BY MOUTH EVERY 4 HOURS 09/26/21   Susy Frizzle, MD  sodium chloride (OCEAN) 0.65 % SOLN nasal spray Place 1 spray into both nostrils as needed for congestion.    [provider]      Allergies    Cefdinir, Prednisone, Macrolides and ketolides, Ceftin [cefuroxime axetil], and Sulfa antibiotics    Review of Systems   Review of Systems  All other systems reviewed and are negative.   Physical Exam Updated Vital Signs BP (!) 144/94 (BP Location: Right Arm)   Pulse (!) 108   Temp 99.2 F (37.3 C) (Oral)   Resp 16   Ht '5\' 1"'$  (1.549 m)   Wt 85.7 kg   SpO2 96%   BMI 35.70 kg/m  Physical Exam Vitals and nursing note reviewed.  Constitutional:      General: She is not in acute distress.    Appearance: Normal appearance. She is well-developed.  HENT:     Head: Normocephalic and atraumatic.  Eyes:     Conjunctiva/sclera: Conjunctivae normal.     Pupils: Pupils are equal, round, and reactive to light.  Cardiovascular:     Rate and Rhythm: Normal rate and regular rhythm.     Heart sounds: Normal heart sounds.  Pulmonary:     Effort: Pulmonary effort is normal. No respiratory distress.     Breath sounds: Normal breath sounds.  Abdominal:     General: There is no distension.     Palpations: Abdomen is soft.     Tenderness: There is abdominal tenderness.     Comments: Mild tenderness to left lower quadrant with deep palpation.  No rebound, no guarding noted.  Musculoskeletal:        General: No deformity. Normal range of motion.     Cervical back: Normal range of motion and neck supple.  Skin:    General: Skin is warm and dry.  Neurological:     General: No focal deficit present.     Mental Status: She is alert and oriented to person, place, and time.     ED Results / Procedures / Treatments   Labs (all labs ordered are listed, but only abnormal results are displayed) Labs Reviewed  BASIC METABOLIC PANEL - Abnormal; Notable for the following components:       Result Value   Glucose, Bld 105 (*)    All other components within normal limits  CBC - Abnormal; Notable for the following components:   WBC 17.4 (*)    RBC 5.77 (*)    MCV 79.4 (*)    MCH 24.3 (*)    Platelets 416 (*)    All other components within normal limits  CULTURE, BLOOD (ROUTINE X 2)  CULTURE, BLOOD (ROUTINE X 2)  URINALYSIS, ROUTINE W REFLEX MICROSCOPIC  LACTIC ACID, PLASMA  LACTIC ACID, PLASMA  I-STAT BETA HCG BLOOD, ED ([facility], [facility], [facility] ONLY)  TROPONIN I (HIGH SENSITIVITY)  TROPONIN I (HIGH SENSITIVITY)    EKG None  Radiology DG  Chest 2 View  Result Date: 06/13/2022 CLINICAL DATA:  51 year old female with chest pain, fever, palpitations. EXAM: CHEST - 2 VIEW COMPARISON:  Portable chest 08/15/2021 and earlier. FINDINGS: Lung volumes and mediastinal contours remain normal. Both lungs appear stable and clear. No pneumothorax or pleural effusion. No acute osseous abnormality identified. Negative visible bowel gas. IMPRESSION: Negative.  No cardiopulmonary abnormality. Electronically Signed   By: Genevie Ann M.D.   On: 06/13/2022 10:03    Procedures Procedures    Medications Ordered in ED Medications  sodium chloride 0.9 % bolus 1,000 mL (has no administration in time range)    ED Course/ Medical Decision Making/ A&P                           Medical Decision Making Amount and/or Complexity of Data Reviewed Labs: ordered. Radiology: ordered.  Risk Prescription drug management.    Medical Screen Complete  This patient presented to the ED with complaint of subjective fever, fatigue, malaise, abdominal pain.  This complaint involves an extensive number of treatment options. The initial differential diagnosis includes, but is not limited to, intra-abdominal pathology, metabolic abnormality, viral versus bacterial infection, etc.  This presentation is: Acute, Self-Limited, Previously Undiagnosed, Uncertain Prognosis, Complicated, Systemic Symptoms, and Threat to  Life/Bodily Function  Patient presents with complaint of subjective fever, malaise, fatigue, abdominal pain.  Patient's recent history is notable for colonoscopy performed early last week.  Patient seen by PCP at the end of last week for same complaints.  White count at the end of last week was apparently 25,000.  White count today is 17.4.  Lactic acid is 1.5.  Electrolytes are without significant abnormality.  CT imaging is without acute abnormality.   Patient is improved after ED evaluation and work-up.  She desires DC home.  Out of abundance of caution will prescribe course of antibiotics to cover possible early urinary tract infection.  Patient is agreeable with this plan.  She does have established PCP that she plans on following up with.    Additional history obtained:  External records from outside sources obtained and reviewed including prior ED visits and prior Inpatient records.    Lab Tests:  I ordered and personally interpreted labs.  The pertinent results include: CBC, BMP, hCG, lactic acid, UA, troponin   Imaging Studies ordered:  I ordered imaging studies including chest x-ray, CT abdomen pelvis I independently visualized and interpreted obtained imaging which showed NAD I agree with the radiologist interpretation.   Cardiac Monitoring:  The patient was maintained on a cardiac monitor.  I personally viewed and interpreted the cardiac monitor which showed an underlying rhythm of: Sinus tach   Medicines ordered:  I ordered medication including Zofran, IV fluid for nausea, dehydration Reevaluation of the patient after these medicines showed that the patient: improved  Problem List / ED Course:  Subjective fever, malaise, fatigue, abdominal pain   Reevaluation:  After the interventions noted above, I reevaluated the patient and found that they have: improved  Disposition:  After consideration of the diagnostic results and the patients response to  treatment, I feel that the patent would benefit from close outpatient follow up.          Final Clinical Impression(s) / ED Diagnoses Final diagnoses:  Other fatigue    Rx / DC Orders ED Discharge Orders     None         Valarie Merino, MD 06/13/22 1538

## 2022-06-13 NOTE — ED Triage Notes (Signed)
Pt c/o heart palpations, fatigue, HA, woke up diaphoreticx5 days.

## 2022-06-13 NOTE — Discharge Instructions (Signed)
Please go to the emergency department as soon as you leave urgent care for further evaluation and management. ?

## 2022-06-13 NOTE — ED Provider Notes (Signed)
[facility]    CSN: [id number] Arrival date & time: 06/13/22  0805      History   Chief Complaint Chief Complaint  Patient presents with   Fever    HPI Andrea [name] is a 51 y.o. female.   Patient presents with fever, fatigue, palpitations.  Patient states that she went to her PCP on 06/08/2022 and had blood work done to be cleared to have a hysterectomy due to findings of a cervical polyp.  Her white blood cells were elevated on that blood work.  She has an appointment on 7/10 to have more extensive blood work including blood cultures completed.  Although, she came in today due to concerns of not wanting to wait that long to have further work-up completed.  She also states that she had a colonoscopy about a week ago, and symptoms started after having the colonoscopy.  She has felt very feverish and fatigued.  She has not taken her temperature with a thermometer but reports that she has been waking up in sweats.  She has been having persistent palpitations.  She does have a history of palpitations but reports that these "feel different" because they are persistent as opposed to intermittent.  Denies any associated chest pain or shortness of breath.  Denies headache, blurred vision, dizziness, nausea, vomiting, diarrhea, abdominal pain, urinary symptoms.  Denies any associated upper respiratory symptoms or cough.  Denies any known sick contacts.   Fever   Past Medical History:  Diagnosis Date   Angiolipoma of kidney    Arthritis    "hands" (09/02/2013)   B12 deficiency    Epicondylitis, lateral    Right   Fibromyalgia    GERD (gastroesophageal reflux disease)    IBS (irritable bowel syndrome)    Migraine headache    "maybe 6-8 times/yr" (09/02/2013)    Patient Active Problem List   Diagnosis Date Noted   UTI (urinary tract infection) 08/15/2021   Hyperglycemia 08/15/2021   Class 2 obesity due to excess calories with body mass index (BMI) of 35.0 to 35.9 in adult  08/15/2021   Lateral epicondylitis of right elbow 08/26/2018   Trigger middle finger of right hand 08/26/2018   Irritable bowel syndrome 06/29/2018   Menstrual migraine 06/29/2018   Fibromyalgia 08/15/2017   Chest pain with low risk for cardiac etiology 09/02/2013   Family history of early CAD 09/02/2013   B12 deficiency     Past Surgical History:  Procedure Laterality Date   APPENDECTOMY  12/11/1982   HYSTEROSCOPY  11/2021   PALATAL EXPANSION  12/11/2002   REDUCTION MAMMAPLASTY Bilateral ~ 2009    OB History   No obstetric history on file.      Home Medications    Prior to Admission medications   Medication Sig Start Date End Date Taking? Authorizing Provider  acetaminophen (TYLENOL) 500 MG tablet Take 1,000 mg by mouth every 6 (six) hours as needed for moderate pain or headache.    [provider]  albuterol (VENTOLIN HFA) 108 (90 Base) MCG/ACT inhaler INHALE 2 PUFFS INTO THE LUNGS EVERY 6 HOURS AS NEEDED FOR WHEEZE OR SHORTNESS OF BREATH 08/19/21   Susy Frizzle, MD  ALPRAZolam Duanne Moron) 0.5 MG tablet TAKE 1 TABLET BY MOUTH AT BEDTIME AS NEEDED FOR SLEEP OR ANXIETY 05/21/22   Susy Frizzle, MD  celecoxib (CELEBREX) 200 MG capsule Take 200 mg by mouth daily as needed for mild pain. 06/25/21   [provider]  cyclobenzaprine (FLEXERIL) 10  MG tablet TAKE 1 TABLET BY MOUTH EVERY 8 HOURS AS NEEDED FOR MUSCLE SPASM 04/28/22   Susy Frizzle, MD  diclofenac (VOLTAREN) 75 MG EC tablet TAKE 1 TABLET BY MOUTH TWICE A DAY 10/31/21   Susy Frizzle, MD  docusate sodium (COLACE) 100 MG capsule Take 200 mg by mouth daily as needed for mild constipation.    [provider]  DULoxetine (CYMBALTA) 60 MG capsule TAKE 1 CAPSULE BY MOUTH EVERY DAY 09/06/21   Susy Frizzle, MD  EMGALITY 120 MG/ML SOAJ INJECT 120 MG INTO THE SKIN EVERY 30 (THIRTY) DAYS. 03/03/22   Susy Frizzle, MD  hydrOXYzine (VISTARIL) 25 MG capsule Take 1 capsule (25 mg total) by mouth  every 8 (eight) hours as needed for anxiety. Take first dose at night time and monitor for drowsiness.  If this  Medication makes you drowsy, do not take while driving or operating heavy machinery. Patient not taking: Reported on 06/08/2022 05/02/21   Eulogio Bear, NP  LINZESS 290 MCG CAPS capsule TAKE 1 CAPSULE (290 MCG TOTAL) BY MOUTH DAILY BEFORE BREAKFAST. 09/06/21   Susy Frizzle, MD  norethindrone (MICRONOR) 0.35 MG tablet Take 1 tablet by mouth at bedtime. Patient not taking: Reported on 06/08/2022    [provider]  oxyCODONE-acetaminophen (PERCOCET) 10-325 MG tablet Take 1 tablet by mouth every 8 (eight) hours as needed for pain (for migraines). 04/21/22   Susy Frizzle, MD  phentermine (ADIPEX-P) 37.5 MG tablet Take 1 tablet (37.5 mg total) by mouth daily before breakfast. 12/08/21   Susy Frizzle, MD  Polyethyl Glycol-Propyl Glycol (SYSTANE OP) Apply 1 drop to eye daily as needed (dry eyes).    [provider]  promethazine (PHENERGAN) 25 MG tablet TAKE 1 TABELT BY MOUTH EVERY 4 HOURS 09/26/21   Susy Frizzle, MD  sodium chloride (OCEAN) 0.65 % SOLN nasal spray Place 1 spray into both nostrils as needed for congestion.    [provider]    Family History Family History  Problem Relation Age of Onset   Coronary artery disease Father 78       MI   Heart attack Father    Diabetes Sister    Irregular heart beat Sister    Congestive Heart Failure Paternal Grandmother    Arrhythmia Paternal Uncle     Social History Social History   Tobacco Use   Smoking status: Never   Smokeless tobacco: Never  Vaping Use   Vaping Use: Never used  Substance Use Topics   Alcohol use: No   Drug use: No     Allergies   Cefdinir, Prednisone, Macrolides and ketolides, Ceftin [cefuroxime axetil], and Sulfa antibiotics   Review of Systems Review of Systems Per HPI  Physical Exam Triage Vital Signs ED Triage Vitals  Enc Vitals Group     BP  06/13/22 0834 131/88     Pulse Rate 06/13/22 0834 (!) 104     Resp 06/13/22 0834 16     Temp 06/13/22 0834 98.1 F (36.7 C)     Temp Source 06/13/22 0834 Oral     SpO2 06/13/22 0834 98 %     Weight --      Height --      Head Circumference --      Peak Flow --      Pain Score 06/13/22 0838 0     Pain Loc --      Pain Edu? --  Excl. in GC? --    No data found.  Updated Vital Signs BP 131/88 (BP Location: Right Arm)   Pulse (!) 104   Temp 98.1 F (36.7 C) (Oral)   Resp 16   SpO2 98%   Visual Acuity Right Eye Distance:   Left Eye Distance:   Bilateral Distance:    Right Eye Near:   Left Eye Near:    Bilateral Near:     Physical Exam Constitutional:      General: She is not in acute distress.    Appearance: Normal appearance. She is not toxic-appearing or diaphoretic.  HENT:     Head: Normocephalic and atraumatic.     Right Ear: Tympanic membrane and ear canal normal.     Left Ear: Tympanic membrane and ear canal normal.     Nose: Nose normal.     Mouth/Throat:     Mouth: Mucous membranes are moist.     Pharynx: No posterior oropharyngeal erythema.  Eyes:     Extraocular Movements: Extraocular movements intact.     Conjunctiva/sclera: Conjunctivae normal.     Pupils: Pupils are equal, round, and reactive to light.  Cardiovascular:     Rate and Rhythm: Normal rate and regular rhythm.     Pulses: Normal pulses.     Heart sounds: Normal heart sounds.  Pulmonary:     Effort: Pulmonary effort is normal. No respiratory distress.     Breath sounds: Normal breath sounds.  Abdominal:     General: Bowel sounds are normal. There is no distension.     Palpations: Abdomen is soft.     Tenderness: There is no abdominal tenderness.  Skin:    General: Skin is warm and dry.  Neurological:     General: No focal deficit present.     Mental Status: She is alert and oriented to person, place, and time. Mental status is at baseline.  Psychiatric:        Mood and Affect:  Mood normal.        Behavior: Behavior normal.        Thought Content: Thought content normal.        Judgment: Judgment normal.      UC Treatments / Results  Labs (all labs ordered are listed, but only abnormal results are displayed) Labs Reviewed - No data to display  EKG   Radiology No results found.  Procedures Procedures (including critical care time)  Medications Ordered in UC Medications - No data to display  Initial Impression / Assessment and Plan / UC Course  I have reviewed the triage vital signs and the nursing notes.  Pertinent labs & imaging results that were available during my care of the patient were reviewed by me and considered in my medical decision making (see chart for details).     No obvious physical exam findings that would indicate cause of patient's symptoms.  EKG showing sinus rhythm with nonspecific T wave abnormality.  Unremarkable when compared to previous EKGs.  After further review of patient's recent PCP visit, white blood cells were significantly elevated.  I do think this warrants more extensive evaluation including possible blood cultures which cannot be provided here in urgent care given limited resources.  Patient was advised to go to the emergency department for further evaluation and management.  Patient was agreeable with plan.  Vital signs stable at discharge.  Patient left via self transport. Final Clinical Impressions(s) / UC Diagnoses   Final diagnoses:  Fever, unspecified  Other fatigue  Palpitations     Discharge Instructions      Please go to the emergency department as soon as you leave urgent care for further evaluation and management.    ED Prescriptions   None    PDMP not reviewed this encounter.   Teodora Medici,  06/13/22 [phone]

## 2022-06-13 NOTE — Discharge Instructions (Signed)
Return for any problem.  ?

## 2022-06-13 NOTE — ED Notes (Signed)
Patient transported to CT 

## 2022-06-14 ENCOUNTER — Encounter: Payer: Self-pay | Admitting: Family Medicine

## 2022-06-18 DIAGNOSIS — L03213 Periorbital cellulitis: Secondary | ICD-10-CM

## 2022-06-18 LAB — CULTURE, BLOOD (ROUTINE X 2)
Culture: NO GROWTH
Culture: NO GROWTH
Special Requests: ADEQUATE

## 2022-06-18 NOTE — ED Provider Notes (Signed)
[facility] EMERGENCY DEPT  EMERGENCY DEPARTMENT HISTORY AND PHYSICAL EXAM      Date: 06/18/2022  Patient Name: Leah Ruiz  MRN: [id number]  Birthdate: 09-Oct-1971  Date of evaluation: 06/18/2022  Provider: Fredrich Romans, MD   Note Started: 9:14 PM EDT 06/18/22    HISTORY OF PRESENT ILLNESS     Chief Complaint   Patient presents with    Eye Problem       History Provided By: Patient    HPI: Leah Ruiz is a 51 y.o. female who complains of right eye pain.  Patient states that she is got redness and swelling around the right eye this been going on for the past couple of days is treated with warm compresses without relief of her symptoms.  Symptoms are mild to moderate at this point time.  She denies any fevers or chills or nausea or vomiting or headache or pain with eye movement or changes in vision.    PAST MEDICAL HISTORY   Past Medical History:  Past Medical History:   Diagnosis Date    Hypertension        Past Surgical History:  Past Surgical History:   Procedure Laterality Date    TUBAL LIGATION         Family History:  History reviewed. No pertinent family history.    Social History:  Social History     Tobacco Use    Smoking status: Never    Smokeless tobacco: Never   Substance Use Topics    Alcohol use: Not Currently    Drug use: Never       Allergies:  No Known Allergies    PCP: No primary care provider on file.    Current Meds:   Current Facility-Administered Medications   Medication Dose Route Frequency Provider Last Rate Last Admin    cephALEXin (KEFLEX) capsule 500 mg  500 mg Oral 4 times per day Haze Justin, MD         Current Outpatient Medications   Medication Sig Dispense Refill    cephALEXin (KEFLEX) 500 MG capsule Take 1 capsule by mouth 2 times daily for 7 days 14 capsule 0       Social Determinants of Health:   Social Determinants of Health     Tobacco Use: Low Risk     Smoking Tobacco Use: Never    Smokeless Tobacco Use: Never    Passive Exposure: Not on file   Alcohol Use: Not At Risk     Frequency of Alcohol Consumption: Never    Average Number of Drinks: Patient does not drink    Frequency of Binge Drinking: Never   Physicist, medical Strain: Not on file   Food Insecurity: Not on file   Transportation Needs: Not on file   Physical Activity: Not on file   Stress: Not on file   Social Connections: Not on file   Intimate Partner Violence: Not on file   Depression: Not on file   Housing Stability: Not on file       PHYSICAL EXAM   Physical Exam  Constitutional:       Appearance: Normal appearance.   HENT:      Head: Normocephalic and atraumatic.   Eyes:      Extraocular Movements: Extraocular movements intact.      Conjunctiva/sclera: Conjunctivae normal.      Pupils: Pupils are equal, round, and reactive to light.      Comments: Periorbital cellulitis.   Musculoskeletal:  Cervical back: Normal range of motion and neck supple.   Skin:     General: Skin is warm and dry.   Neurological:      General: No focal deficit present.      Mental Status: She is alert and oriented to person, place, and time.         SCREENINGS              LAB, EKG AND DIAGNOSTIC RESULTS   Labs:  No results found for this or any previous visit (from the past 12 hour(s)).    ED COURSE and DIFFERENTIAL DIAGNOSIS/MDM   CC/HPI Summary, DDx, ED Course, and Reassessment: Patient has periorbital cellulitis she has no changes in vision and no pain with eye movement.    Records Reviewed (source and summary of external notes): Prior medical records and Nursing notes    Vitals:    Vitals:    06/18/22 2106   BP: (!) 166/97   Pulse: 87   Resp: 18   Temp: 98.4 F (36.9 C)   TempSrc: Oral   SpO2: 99%   Weight: (!) 140.6 kg (310 lb)   Height: 1.829 m (6')        ED COURSE       Disposition Considerations (Tests not done, Shared Decision Making, Pt Expectation of Test or Treatment.):  We will treat with antibiotics as an outpatient.     Patient was given the following medications:  Medications   cephALEXin (KEFLEX) capsule 500 mg (has no  administration in time range)       CONSULTS: (Who and What was discussed)  None     Social Determinants affecting Dx or Tx: None    Smoking Cessation: Not Applicable    PROCEDURES   Unless otherwise noted above, none.  Procedures      CRITICAL CARE TIME   Patient does not meet Critical Care Time, 0 minutes    FINAL IMPRESSION     1. Periorbital cellulitis of right eye          DISPOSITION/PLAN   DISPOSITION Decision To Discharge 06/18/2022 09:12:19 PM    Discharge Note: The patient is stable for discharge home. The signs, symptoms, diagnosis, and discharge instructions have been discussed, understanding conveyed, and agreed upon. The patient is to follow up as recommended or return to ER should their symptoms worsen.      PATIENT REFERRED TO:  No follow-up provider specified.      DISCHARGE MEDICATIONS:     Medication List        START taking these medications      cephALEXin 500 MG capsule  Commonly known as: KEFLEX  Take 1 capsule by mouth 2 times daily for 7 days               Where to Get Your Medications        These medications were sent to [facility] - [place], [place] - [address] - P [phone] - F [phone]  [address], [place] [address]      Phone: [phone]   cephALEXin 500 MG capsule           DISCONTINUED MEDICATIONS:  Current Discharge Medication List          I am the Primary Clinician of Record: Fredrich Romans, MD (electronically signed)    (Please note that parts of this dictation were completed with voice recognition software. Quite often unanticipated grammatical, syntax, homophones, and other interpretive errors  are inadvertently transcribed by the computer software. Please disregards these errors. Please excuse any errors that have escaped final proofreading.)     Haze Justin, MD  06/18/22 2116

## 2022-06-18 NOTE — ED Triage Notes (Addendum)
Pt presents with right eye swelling and redness. States the itching and redness started on Friday and the eye began to swell on Saturday. Denies any injury or foreign body in eye.

## 2022-06-19 ENCOUNTER — Inpatient Hospital Stay
Admit: 2022-06-19 | Discharge: 2022-06-19 | Disposition: A | Discharge status: Home / Self Care | Payer: PRIVATE HEALTH INSURANCE | Attending: Emergency Medicine

## 2022-06-19 ENCOUNTER — Other Ambulatory Visit: Payer: BC Managed Care – PPO

## 2022-06-19 DIAGNOSIS — D72829 Elevated white blood cell count, unspecified: Secondary | ICD-10-CM

## 2022-06-19 MED ORDER — CEPHALEXIN 500 MG PO CAPS
500 | Freq: Four times a day (QID) | ORAL | Status: DC
Start: 2022-06-19 — End: 2022-06-18

## 2022-06-19 MED ORDER — CEPHALEXIN 500 MG PO CAPS
500 MG | ORAL | Status: AC
Start: 2022-06-19 — End: 2022-06-18
  Administered 2022-06-19: 01:00:00 500 mg via ORAL

## 2022-06-19 MED ORDER — CEPHALEXIN 500 MG PO CAPS
500 MG | ORAL_CAPSULE | Freq: Two times a day (BID) | ORAL | 0 refills | Status: AC
Start: 2022-06-19 — End: 2022-06-25

## 2022-06-19 MED FILL — CEPHALEXIN 500 MG PO CAPS: 500 MG | ORAL | Qty: 1

## 2022-06-20 ENCOUNTER — Other Ambulatory Visit: Payer: Self-pay | Admitting: Family Medicine

## 2022-06-20 DIAGNOSIS — D72829 Elevated white blood cell count, unspecified: Secondary | ICD-10-CM

## 2022-06-20 LAB — URINALYSIS, ROUTINE W REFLEX MICROSCOPIC
Bilirubin Urine: NEGATIVE
Glucose, UA: NEGATIVE
Hgb urine dipstick: NEGATIVE
Hyaline Cast: NONE SEEN /LPF
Ketones, ur: NEGATIVE
Nitrite: NEGATIVE
Protein, ur: NEGATIVE
RBC / HPF: NONE SEEN /HPF (ref 0–2)
Specific Gravity, Urine: 1.022 (ref 1.001–1.035)
pH: 7 (ref 5.0–8.0)

## 2022-06-20 LAB — MICROSCOPIC MESSAGE

## 2022-06-22 ENCOUNTER — Other Ambulatory Visit: Payer: Self-pay | Admitting: Family Medicine

## 2022-06-22 ENCOUNTER — Encounter: Payer: Self-pay | Admitting: Family Medicine

## 2022-06-22 ENCOUNTER — Other Ambulatory Visit: Payer: BC Managed Care – PPO

## 2022-06-22 DIAGNOSIS — D72829 Elevated white blood cell count, unspecified: Secondary | ICD-10-CM

## 2022-06-22 LAB — CBC WITH DIFFERENTIAL/PLATELET
Absolute Monocytes: 595 cells/uL (ref 200–950)
Basophils Absolute: 37 cells/uL (ref 0–200)
Basophils Relative: 0.3 %
Eosinophils Absolute: 50 cells/uL (ref 15–500)
Eosinophils Relative: 0.4 %
HCT: 38.3 % (ref 35.0–45.0)
Hemoglobin: 12.3 g/dL (ref 11.7–15.5)
Lymphs Abs: 1848 cells/uL (ref 850–3900)
MCH: 24.8 pg — ABNORMAL LOW (ref 27.0–33.0)
MCHC: 32.1 g/dL (ref 32.0–36.0)
MCV: 77.2 fL — ABNORMAL LOW (ref 80.0–100.0)
MPV: 10.4 fL (ref 7.5–12.5)
Monocytes Relative: 4.8 %
Neutro Abs: 9870 cells/uL — ABNORMAL HIGH (ref 1500–7800)
Neutrophils Relative %: 79.6 %
Platelets: 306 10*3/uL (ref 140–400)
RBC: 4.96 10*6/uL (ref 3.80–5.10)
RDW: 14.5 % (ref 11.0–15.0)
Total Lymphocyte: 14.9 %
WBC: 12.4 10*3/uL — ABNORMAL HIGH (ref 3.8–10.8)

## 2022-06-22 NOTE — Telephone Encounter (Signed)
Requested medication (s) are due for refill today: yes  Requested medication (s) are on the active medication list: yes  Last refill:  04/28/22 #60/1  Future visit scheduled: no  Notes to clinic:  Unable to refill per protocol, cannot delegate.    Requested Prescriptions  Pending Prescriptions Disp Refills   cyclobenzaprine (FLEXERIL) 10 MG tablet [Pharmacy Med Name: CYCLOBENZAPRINE 10 MG TABLET] 60 tablet 1    Sig: TAKE 1 TABLET BY MOUTH EVERY 8 HOURS AS NEEDED FOR MUSCLE SPASMS     Not Delegated - Analgesics:  Muscle Relaxants Failed - 06/22/2022  2:28 PM      Failed - This refill cannot be delegated      Failed - Valid encounter within last 6 months    Recent Outpatient Visits           6 months ago Fatigue, unspecified type   [place] Susy Frizzle, MD   1 year ago Palpitations   [place] Eulogio Bear, NP   1 year ago [place] Eulogio Bear, NP   1 year ago Neck pain   [place] [facility]enn[place] Schaumann, Cammie Mcgee, MD   1 year ago [place] Pickard, Cammie Mcgee, MD

## 2022-06-23 ENCOUNTER — Encounter: Payer: Self-pay | Admitting: Family Medicine

## 2022-06-23 ENCOUNTER — Other Ambulatory Visit: Payer: Self-pay | Admitting: Family Medicine

## 2022-06-23 MED ORDER — NITROFURANTOIN MONOHYD MACRO 100 MG PO CAPS: 100.0000 mg | ORAL_CAPSULE | Freq: Two times a day (BID) | ORAL | 0 refills | Status: DC

## 2022-06-23 MED ORDER — ALPRAZOLAM 0.5 MG PO TABS
0.5000 mg | ORAL_TABLET | Freq: Two times a day (BID) | ORAL | 0 refills | Status: DC | PRN
Start: 2022-06-23 — End: 2022-07-20

## 2022-06-26 LAB — CULTURE, BLOOD (SINGLE): MICRO NUMBER:: 13633123

## 2022-06-27 ENCOUNTER — Other Ambulatory Visit: Payer: Self-pay | Admitting: Family Medicine

## 2022-06-27 ENCOUNTER — Encounter: Payer: Self-pay | Admitting: Family Medicine

## 2022-06-27 MED ORDER — NYSTATIN 100000 UNIT/ML MT SUSP: 5.0000 mL | Freq: Four times a day (QID) | OROMUCOSAL | 0 refills | Status: DC

## 2022-06-28 ENCOUNTER — Other Ambulatory Visit: Payer: Self-pay | Admitting: Family Medicine

## 2022-06-29 NOTE — Telephone Encounter (Signed)
Requested medication (s) are due for refill today:   Provider to review  Requested medication (s) are on the active medication list:   Yes  Future visit scheduled:   Yes on 07/04/2022   Last ordered: 09/26/2021 #20, 1 refill  Non delegated refill reason returned    Requested Prescriptions  Pending Prescriptions Disp Refills   promethazine (PHENERGAN) 25 MG tablet [Pharmacy Med Name: PROMETHAZINE 25 MG TABLET] 20 tablet 1    Sig: TAKE 1 TABELT BY MOUTH EVERY 4 HOURS     Not Delegated - Gastroenterology: Antiemetics Failed - 06/28/2022  4:24 AM      Failed - This refill cannot be delegated      Failed - Valid encounter within last 6 months    Recent Outpatient Visits           6 months ago Fatigue, unspecified type   [place] Susy Frizzle, MD   1 year ago Palpitations   [place], Jessica A, NP   1 year ago [place] Eulogio Bear, NP   1 year ago Neck pain   [place] Dennard Schaumann, Cammie Mcgee, MD   1 year ago [place], Cammie Mcgee, MD       Future Appointments             In 1 week Pickard, Cammie Mcgee, MD [place]

## 2022-07-03 ENCOUNTER — Other Ambulatory Visit: Payer: Self-pay | Admitting: Family Medicine

## 2022-07-04 ENCOUNTER — Other Ambulatory Visit: Payer: BC Managed Care – PPO

## 2022-07-04 DIAGNOSIS — N3000 Acute cystitis without hematuria: Secondary | ICD-10-CM

## 2022-07-04 LAB — CBC WITH DIFFERENTIAL/PLATELET
Absolute Monocytes: 680 cells/uL (ref 200–950)
Basophils Absolute: 41 cells/uL (ref 0–200)
Basophils Relative: 0.4 %
Eosinophils Absolute: 144 cells/uL (ref 15–500)
Eosinophils Relative: 1.4 %
HCT: 38.7 % (ref 35.0–45.0)
Hemoglobin: 12.5 g/dL (ref 11.7–15.5)
Lymphs Abs: 1545 cells/uL (ref 850–3900)
MCH: 25.5 pg — ABNORMAL LOW (ref 27.0–33.0)
MCHC: 32.3 g/dL (ref 32.0–36.0)
MCV: 79 fL — ABNORMAL LOW (ref 80.0–100.0)
MPV: 10.2 fL (ref 7.5–12.5)
Monocytes Relative: 6.6 %
Neutro Abs: 7890 cells/uL — ABNORMAL HIGH (ref 1500–7800)
Neutrophils Relative %: 76.6 %
Platelets: 278 10*3/uL (ref 140–400)
RBC: 4.9 10*6/uL (ref 3.80–5.10)
RDW: 15.1 % — ABNORMAL HIGH (ref 11.0–15.0)
Total Lymphocyte: 15 %
WBC: 10.3 10*3/uL (ref 3.8–10.8)

## 2022-07-04 MED ORDER — OXYCODONE-ACETAMINOPHEN 10-325 MG PO TABS: 1.0000 | ORAL_TABLET | Freq: Three times a day (TID) | ORAL | 0 refills | Status: DC | PRN

## 2022-07-05 LAB — URINALYSIS, ROUTINE W REFLEX MICROSCOPIC
Bilirubin Urine: NEGATIVE
Glucose, UA: NEGATIVE
Hgb urine dipstick: NEGATIVE
Hyaline Cast: NONE SEEN /LPF
Ketones, ur: NEGATIVE
Nitrite: NEGATIVE
Protein, ur: NEGATIVE
RBC / HPF: NONE SEEN /HPF (ref 0–2)
Specific Gravity, Urine: 1.01 (ref 1.001–1.035)
pH: 7 (ref 5.0–8.0)

## 2022-07-05 LAB — MICROSCOPIC MESSAGE

## 2022-07-06 ENCOUNTER — Encounter: Payer: Self-pay | Admitting: Family Medicine

## 2022-07-07 ENCOUNTER — Ambulatory Visit (INDEPENDENT_AMBULATORY_CARE_PROVIDER_SITE_OTHER): Payer: BC Managed Care – PPO | Admitting: Family Medicine

## 2022-07-07 VITALS — BP 120/82 | HR 95 | Temp 98.3°F | Ht 61.0 in | Wt 191.0 lb

## 2022-07-07 DIAGNOSIS — R8281 Pyuria: Secondary | ICD-10-CM | POA: Diagnosis not present

## 2022-07-07 LAB — URINALYSIS, ROUTINE W REFLEX MICROSCOPIC
Bilirubin Urine: NEGATIVE
Glucose, UA: NEGATIVE
Hgb urine dipstick: NEGATIVE
Ketones, ur: NEGATIVE
Leukocytes,Ua: NEGATIVE
Nitrite: NEGATIVE
Protein, ur: NEGATIVE
Specific Gravity, Urine: 1.015 (ref 1.001–1.035)
pH: 6 (ref 5.0–8.0)

## 2022-07-07 NOTE — Progress Notes (Signed)
Subjective:    [name] ID: Andrea [name], female    DOB: 03/23/1971, 51 y.o.   MRN: [id number]  I recently saw [name] for surgical clearance.  Part of her work-up included a CBC which showed an elevated white blood cell count of approximately 23.  [name] went to the emergency room due to the elevated white blood cell count and in the emergency room, her urinalysis was abnormal.  Her leukocytosis was confirmed.  They started on antibiotics for possible urinary tract infection.  She did have a CT scan of the abdomen pelvis as well as a chest x-ray and blood cultures that showed no specific cause.  Blood cultures have remained negative to date.  CBC showed improvement in her white blood cell count after 1 antibiotic however she continued to have pyuria on her urinalysis so she received a second course of antibiotics/Macrobid through our office.  Repeat CBC earlier this week was back to normal however she continues to have +3 leukocyte esterase and rare bacteria on her urinalysis.  She is completely asymptomatic. Past Medical History:  Diagnosis Date   Angiolipoma of kidney    Arthritis    "hands" (09/02/2013)   B12 deficiency    Epicondylitis, lateral    Right   Fibromyalgia    GERD (gastroesophageal reflux disease)    IBS (irritable bowel syndrome)    Migraine headache    "maybe 6-8 times/yr" (09/02/2013)   Past Surgical History:  Procedure Laterality Date   APPENDECTOMY  12/11/1982   HYSTEROSCOPY  11/2021   PALATAL EXPANSION  12/11/2002   REDUCTION MAMMAPLASTY Bilateral ~ 2009   Current Outpatient Medications on File Prior to Visit  Medication Sig Dispense Refill   acetaminophen (TYLENOL) 500 MG tablet Take 1,000 mg by mouth every 6 (six) hours as needed for moderate pain or headache.     albuterol (VENTOLIN HFA) 108 (90 Base) MCG/ACT inhaler INHALE 2 PUFFS INTO THE LUNGS EVERY 6 HOURS AS NEEDED FOR WHEEZE OR SHORTNESS OF BREATH 8.5 each 2   ALPRAZolam (XANAX) 0.5 MG tablet  Take 1 tablet (0.5 mg total) by mouth 2 (two) times daily as needed for anxiety. 30 tablet 0   celecoxib (CELEBREX) 200 MG capsule Take 200 mg by mouth daily as needed for mild pain.     cyclobenzaprine (FLEXERIL) 10 MG tablet TAKE 1 TABLET BY MOUTH EVERY 8 HOURS AS NEEDED FOR MUSCLE SPASMS 60 tablet 1   diclofenac (VOLTAREN) 75 MG EC tablet TAKE 1 TABLET BY MOUTH TWICE A DAY 60 tablet 4   docusate sodium (COLACE) 100 MG capsule Take 200 mg by mouth daily as needed for mild constipation.     DULoxetine (CYMBALTA) 60 MG capsule TAKE 1 CAPSULE BY MOUTH EVERY DAY 90 capsule 1   EMGALITY 120 MG/ML SOAJ INJECT 120 MG INTO THE SKIN EVERY 30 (THIRTY) DAYS. 1 mL 11   hydrOXYzine (VISTARIL) 25 MG capsule Take 1 capsule (25 mg total) by mouth every 8 (eight) hours as needed for anxiety. Take first dose at night time and monitor for drowsiness.  If this  Medication makes you drowsy, do not take while driving or operating heavy machinery. 30 capsule 0   levofloxacin (LEVAQUIN) 500 MG tablet Take 1 tablet (500 mg total) by mouth daily. 7 tablet 0   LINZESS 290 MCG CAPS capsule TAKE 1 CAPSULE (290 MCG TOTAL) BY MOUTH DAILY BEFORE BREAKFAST. 30 capsule 3   nitrofurantoin, macrocrystal-monohydrate, (MACROBID) 100 MG capsule Take 1 capsule (100  mg total) by mouth 2 (two) times daily. 14 capsule 0   norethindrone (MICRONOR) 0.35 MG tablet Take 1 tablet by mouth at bedtime.     nystatin (MYCOSTATIN) 100000 UNIT/ML suspension Take 5 mLs (500,000 Units total) by mouth 4 (four) times daily. 60 mL 0   oxyCODONE-acetaminophen (PERCOCET) 10-325 MG tablet Take 1 tablet by mouth every 8 (eight) hours as needed for pain (for migraines). 30 tablet 0   phentermine (ADIPEX-P) 37.5 MG tablet Take 1 tablet (37.5 mg total) by mouth daily before breakfast. 30 tablet 2   Polyethyl Glycol-Propyl Glycol (SYSTANE OP) Apply 1 drop to eye daily as needed (dry eyes).     promethazine (PHENERGAN) 25 MG tablet TAKE 1 TABELT BY MOUTH EVERY 4  HOURS 20 tablet 1   sodium chloride (OCEAN) 0.65 % SOLN nasal spray Place 1 spray into both nostrils as needed for congestion.     Current Facility-Administered Medications on File Prior to Visit  Medication Dose Route Frequency Provider Last Rate Last Admin   betamethasone acetate-betamethasone sodium phosphate (CELESTONE) injection 3 mg  3 mg Intra-articular Once Edrick Kins, DPM       Allergies  Allergen Reactions   Cefdinir Hives   Prednisone Rash     Only when takes in large doses   Macrolides And Ketolides     Headache,shortness of breath, heavy chest   Ceftin [Cefuroxime Axetil] Rash   Sulfa Antibiotics Rash   Social History   Socioeconomic History   Marital status: Married    Spouse name: Not on file   Number of children: Not on file   Years of education: Not on file   Highest education level: Not on file  Occupational History   Occupation: kindergarten teacher  Tobacco Use   Smoking status: Never   Smokeless tobacco: Never  Vaping Use   Vaping Use: Never used  Substance and Sexual Activity   Alcohol use: No   Drug use: No   Sexual activity: Not Currently  Other Topics Concern   Not on file  Social History Narrative   Not on file   Social Determinants of Health   Financial Resource Strain: Not on file  Food Insecurity: Not on file  Transportation Needs: Not on file  Physical Activity: Not on file  Stress: Not on file  Social Connections: Not on file  Intimate Partner Violence: Not on file      Review of Systems  All other systems reviewed and are negative.      Objective:   Physical Exam Vitals reviewed.  Constitutional:      General: She is not in acute distress.    Appearance: She is normal weight. She is not ill-appearing, toxic-appearing or diaphoretic.  Cardiovascular:     Rate and Rhythm: Normal rate and regular rhythm.     Heart sounds: Normal heart sounds. No murmur heard.    No friction rub. No gallop.  Pulmonary:     Effort:  Pulmonary effort is normal. No respiratory distress.     Breath sounds: Normal breath sounds. No wheezing or rales.  Abdominal:     General: Abdomen is flat. Bowel sounds are normal.  Neurological:     Mental Status: She is alert.           Assessment & Plan:  Pyuria - Plan: Urine Culture, Urinalysis, Routine w reflex microscopic I believe the leukocytosis was likely due to a urinary tract infection and this has resolved after 2 rounds of antibiotics.  However I am concerned that her urinalysis remains abnormal.  This could be contaminant.  Therefore I will repeat a urinalysis and also check a urine culture.  If the urine culture shows bacteria, I will consult urology as [name] would likely need cystoscopy to determine if there is a structural abnormality in the bladder leading to recurrent urinary tract infections.  If the urine culture is negative, I would attribute the +3 leukocyte esterase to vaginal contaminant

## 2022-07-08 LAB — URINE CULTURE
MICRO NUMBER:: 13708247
Result:: NO GROWTH
SPECIMEN QUALITY:: ADEQUATE

## 2022-07-13 ENCOUNTER — Encounter: Payer: Self-pay | Admitting: Family Medicine

## 2022-07-17 ENCOUNTER — Other Ambulatory Visit: Payer: Self-pay

## 2022-07-17 NOTE — Telephone Encounter (Signed)
Pharmacy faxed a refill request for DULoxetine (CYMBALTA) 60 MG capsule [[id number]]    Order Details Dose, Route, Frequency: As Directed  Dispense Quantity: 90 capsule Refills: 1        Sig: TAKE 1 CAPSULE BY MOUTH EVERY DAY       Start Date: 09/06/21 End Date: --  Written Date: 09/06/21 Expiration Date: 09/06/22

## 2022-07-18 ENCOUNTER — Other Ambulatory Visit: Payer: Self-pay

## 2022-07-18 ENCOUNTER — Emergency Department (HOSPITAL_BASED_OUTPATIENT_CLINIC_OR_DEPARTMENT_OTHER): Payer: BC Managed Care – PPO

## 2022-07-18 ENCOUNTER — Encounter: Payer: Self-pay | Admitting: Family Medicine

## 2022-07-18 ENCOUNTER — Emergency Department (HOSPITAL_BASED_OUTPATIENT_CLINIC_OR_DEPARTMENT_OTHER)
Admission: EM | Admit: 2022-07-18 | Discharge: 2022-07-18 | Disposition: A | Discharge status: Home / Self Care | Payer: BC Managed Care – PPO | Attending: Emergency Medicine | Admitting: Emergency Medicine

## 2022-07-18 ENCOUNTER — Encounter (HOSPITAL_BASED_OUTPATIENT_CLINIC_OR_DEPARTMENT_OTHER): Payer: Self-pay | Admitting: Emergency Medicine

## 2022-07-18 DIAGNOSIS — Z79899 Other long term (current) drug therapy: Secondary | ICD-10-CM | POA: Insufficient documentation

## 2022-07-18 DIAGNOSIS — R519 Headache, unspecified: Secondary | ICD-10-CM | POA: Diagnosis present

## 2022-07-18 DIAGNOSIS — H9202 Otalgia, left ear: Secondary | ICD-10-CM | POA: Diagnosis not present

## 2022-07-18 LAB — CBC WITH DIFFERENTIAL/PLATELET
Abs Immature Granulocytes: 0.07 10*3/uL (ref 0.00–0.07)
Basophils Absolute: 0.1 10*3/uL (ref 0.0–0.1)
Basophils Relative: 0 %
Eosinophils Absolute: 0.1 10*3/uL (ref 0.0–0.5)
Eosinophils Relative: 0 %
HCT: 42.6 % (ref 36.0–46.0)
Hemoglobin: 13.1 g/dL (ref 12.0–15.0)
Immature Granulocytes: 1 %
Lymphocytes Relative: 18 %
Lymphs Abs: 2.4 10*3/uL (ref 0.7–4.0)
MCH: 24.7 pg — ABNORMAL LOW (ref 26.0–34.0)
MCHC: 30.8 g/dL (ref 30.0–36.0)
MCV: 80.2 fL (ref 80.0–100.0)
Monocytes Absolute: 0.4 10*3/uL (ref 0.1–1.0)
Monocytes Relative: 3 %
Neutro Abs: 10.4 10*3/uL — ABNORMAL HIGH (ref 1.7–7.7)
Neutrophils Relative %: 78 %
Platelets: 415 10*3/uL — ABNORMAL HIGH (ref 150–400)
RBC: 5.31 MIL/uL — ABNORMAL HIGH (ref 3.87–5.11)
RDW: 15.6 % — ABNORMAL HIGH (ref 11.5–15.5)
WBC: 13.5 10*3/uL — ABNORMAL HIGH (ref 4.0–10.5)
nRBC: 0 % (ref 0.0–0.2)

## 2022-07-18 LAB — COMPREHENSIVE METABOLIC PANEL
ALT: 11 U/L (ref 0–44)
AST: 14 U/L — ABNORMAL LOW (ref 15–41)
Albumin: 4.4 g/dL (ref 3.5–5.0)
Alkaline Phosphatase: 80 U/L (ref 38–126)
Anion gap: 9 (ref 5–15)
BUN: 16 mg/dL (ref 6–20)
CO2: 26 mmol/L (ref 22–32)
Calcium: 9.3 mg/dL (ref 8.9–10.3)
Chloride: 104 mmol/L (ref 98–111)
Creatinine, Ser: 0.87 mg/dL (ref 0.44–1.00)
GFR, Estimated: >60 mL/min (ref >60)
Glucose, Bld: 129 mg/dL — ABNORMAL HIGH (ref 70–99)
Potassium: 4 mmol/L (ref 3.5–5.1)
Sodium: 139 mmol/L (ref 135–145)
Total Bilirubin: 0.2 mg/dL — ABNORMAL LOW (ref 0.3–1.2)
Total Protein: 7.3 g/dL (ref 6.5–8.1)

## 2022-07-18 LAB — HCG, SERUM, QUALITATIVE: Preg, Serum: NEGATIVE

## 2022-07-18 MED ORDER — METOCLOPRAMIDE HCL 5 MG/ML IJ SOLN
10.0000 mg | Freq: Once | INTRAMUSCULAR | Status: AC
Start: 2022-07-18 — End: 2022-07-18
  Administered 2022-07-18: 10 mg via INTRAVENOUS
  Filled 2022-07-18: qty 2

## 2022-07-18 MED ORDER — SODIUM CHLORIDE 0.9 % IV BOLUS
1000.0000 mL | Freq: Once | INTRAVENOUS | Status: AC
  Administered 2022-07-18: 1000 mL via INTRAVENOUS

## 2022-07-18 MED ORDER — DIPHENHYDRAMINE HCL 50 MG/ML IJ SOLN
25.0000 mg | Freq: Once | INTRAMUSCULAR | Status: AC
  Administered 2022-07-18: 25 mg via INTRAVENOUS
  Filled 2022-07-18: qty 1

## 2022-07-18 MED ORDER — DULOXETINE HCL 60 MG PO CPEP: 60.0000 mg | ORAL_CAPSULE | Freq: Every day | ORAL | 0 refills | Status: DC

## 2022-07-18 NOTE — ED Triage Notes (Signed)
Pt arrives to ED with c/o migraine. She reports this started x3 days ago and is left sided. She reports mild numbness behind her left ear and pain behind her left eye. Pt reports treatment for tooth abscess which required a root canal this week, she reports being on two antibiotics for this.

## 2022-07-18 NOTE — ED Provider Notes (Signed)
[facility] Provider Note   CSN: [id number] Arrival date & time: 07/18/22  1432     History  Chief Complaint  Patient presents with   Migraine    Andrea [name] is a 51 y.o. female.  She has a history of migraines and presents with concern of "migraine" x 3 days. She reports it is predominately on her left side. She reports nausea, sensitivity to light, sensitivity to sound. She reports blurry vision in her left eye and "spots" that lasted for about 1 hour that she believes occurred 4 days ago.  She reports a "numbness" sensation on her left posterior ear that causes pain throughout her head when pressed, she is unsure when this sensation began in relation to her root canal. She denies fevers, vomiting. She reports this headache differs from her previous migraines, she reports the previous are bilateral. Of note, she reports a recent root canal/abscess drainage she believes was 1-2 weeks ago. She believes she gave herself an injection of Emgality last week for migraine. She has tried percocet for her headache. She denies a history of diabetes. She reports being unsure of the timeline for various aspects of her history.      Home Medications Prior to Admission medications   Medication Sig Start Date End Date Taking? Authorizing Provider  acetaminophen (TYLENOL) 500 MG tablet Take 1,000 mg by mouth every 6 (six) hours as needed for moderate pain or headache.    [provider]  albuterol (VENTOLIN HFA) 108 (90 Base) MCG/ACT inhaler INHALE 2 PUFFS INTO THE LUNGS EVERY 6 HOURS AS NEEDED FOR WHEEZE OR SHORTNESS OF BREATH 08/19/21   Susy Frizzle, MD  ALPRAZolam Duanne Moron) 0.5 MG tablet Take 1 tablet (0.5 mg total) by mouth 2 (two) times daily as needed for anxiety. 06/23/22   Susy Frizzle, MD  celecoxib (CELEBREX) 200 MG capsule Take 200 mg by mouth daily as needed for mild pain. 06/25/21   [provider]  cyclobenzaprine (FLEXERIL) 10 MG  tablet TAKE 1 TABLET BY MOUTH EVERY 8 HOURS AS NEEDED FOR MUSCLE SPASMS 06/29/22   Susy Frizzle, MD  diclofenac (VOLTAREN) 75 MG EC tablet TAKE 1 TABLET BY MOUTH TWICE A DAY 10/31/21   Susy Frizzle, MD  docusate sodium (COLACE) 100 MG capsule Take 200 mg by mouth daily as needed for mild constipation.    [provider]  DULoxetine (CYMBALTA) 60 MG capsule Take 1 capsule (60 mg total) by mouth daily. 07/18/22   Susy Frizzle, MD  EMGALITY 120 MG/ML SOAJ INJECT 120 MG INTO THE SKIN EVERY 30 (THIRTY) DAYS. 03/03/22   Susy Frizzle, MD  hydrOXYzine (VISTARIL) 25 MG capsule Take 1 capsule (25 mg total) by mouth every 8 (eight) hours as needed for anxiety. Take first dose at night time and monitor for drowsiness.  If this  Medication makes you drowsy, do not take while driving or operating heavy machinery. 05/02/21   Eulogio Bear, NP  levofloxacin (LEVAQUIN) 500 MG tablet Take 1 tablet (500 mg total) by mouth daily. 06/13/22   Valarie Merino, MD  LINZESS 290 MCG CAPS capsule TAKE 1 CAPSULE (290 MCG TOTAL) BY MOUTH DAILY BEFORE BREAKFAST. 09/06/21   Susy Frizzle, MD  nitrofurantoin, macrocrystal-monohydrate, (MACROBID) 100 MG capsule Take 1 capsule (100 mg total) by mouth 2 (two) times daily. 06/23/22   Susy Frizzle, MD  norethindrone (MICRONOR) 0.35 MG tablet Take 1 tablet by mouth at bedtime.  [provider]  nystatin (MYCOSTATIN) 100000 UNIT/ML suspension Take 5 mLs (500,000 Units total) by mouth 4 (four) times daily. 06/27/22   Susy Frizzle, MD  oxyCODONE-acetaminophen (PERCOCET) 10-325 MG tablet Take 1 tablet by mouth every 8 (eight) hours as needed for pain (for migraines). 07/04/22   Susy Frizzle, MD  phentermine (ADIPEX-P) 37.5 MG tablet Take 1 tablet (37.5 mg total) by mouth daily before breakfast. 12/08/21   Susy Frizzle, MD  Polyethyl Glycol-Propyl Glycol (SYSTANE OP) Apply 1 drop to eye daily as needed (dry eyes).    [provider]  promethazine (PHENERGAN) 25 MG tablet TAKE 1 TABELT BY MOUTH EVERY 4 HOURS 06/29/22   Susy Frizzle, MD  sodium chloride (OCEAN) 0.65 % SOLN nasal spray Place 1 spray into both nostrils as needed for congestion.    [provider]      Allergies    Cefdinir, Prednisone, Macrolides and ketolides, Ceftin [cefuroxime axetil], and Sulfa antibiotics    Review of Systems   Review of Systems  Physical Exam Updated Vital Signs BP 109/66   Pulse 84   Temp 98.4 F (36.9 C)   Resp 14   Ht 5' (1.524 m)   Wt 85.7 kg   SpO2 100%   BMI 36.91 kg/m   Physical Exam Vitals and nursing note reviewed.  Constitutional:      Appearance: She is well-developed.  HENT:     Head: Normocephalic and atraumatic.     Right Ear: Tympanic membrane, ear canal and external ear normal.     Left Ear: Tympanic membrane, ear canal and external ear normal.     Ears:     Comments: TMs normal bilaterally.  No significant mastoid tenderness, redness, or swelling.    Nose: Nose normal.     Mouth/Throat:     Pharynx: Uvula midline.  Eyes:     General: Lids are normal.     Extraocular Movements:     Right eye: No nystagmus.     Left eye: No nystagmus.     Conjunctiva/sclera: Conjunctivae normal.     Pupils: Pupils are equal, round, and reactive to light.  Cardiovascular:     Rate and Rhythm: Normal rate and regular rhythm.  Pulmonary:     Effort: Pulmonary effort is normal.     Breath sounds: Normal breath sounds.  Abdominal:     Palpations: Abdomen is soft.     Tenderness: There is no abdominal tenderness.  Musculoskeletal:     Cervical back: Normal range of motion and neck supple. No tenderness or bony tenderness.  Skin:    General: Skin is warm and dry.  Neurological:     Mental Status: She is alert and oriented to person, place, and time.     GCS: GCS eye subscore is 4. GCS verbal subscore is 5. GCS motor subscore is 6.     Cranial Nerves: No cranial nerve deficit.      Sensory: No sensory deficit.     Motor: No weakness.     Coordination: Coordination normal.     Comments: Upper extremity myotomes tested bilaterally:  C5 Shoulder abduction 5/5 C6 Elbow flexion/wrist extension 5/5 C7 Elbow extension 5/5 C8 Finger flexion 5/5 T1 Finger abduction 5/5  Lower extremity myotomes tested bilaterally: L2 Hip flexion 5/5 L3 Knee extension 5/5 L4 Ankle dorsiflexion 5/5 S1 Ankle plantar flexion 5/5     ED Results / Procedures / Treatments   Labs (all labs ordered are  listed, but only abnormal results are displayed) Labs Reviewed  CBC WITH DIFFERENTIAL/PLATELET - Abnormal; Notable for the following components:      Result Value   WBC 13.5 (*)    RBC 5.31 (*)    MCH 24.7 (*)    RDW 15.6 (*)    Platelets 415 (*)    Neutro Abs 10.4 (*)    All other components within normal limits  COMPREHENSIVE METABOLIC PANEL - Abnormal; Notable for the following components:   Glucose, Bld 129 (*)    AST 14 (*)    Total Bilirubin 0.2 (*)    All other components within normal limits  HCG, SERUM, QUALITATIVE    EKG None  Radiology CT Head Wo Contrast  Result Date: 07/18/2022 CLINICAL DATA:  Migraine, swelling behind left ear EXAM: CT HEAD WITHOUT CONTRAST TECHNIQUE: Contiguous axial images were obtained from the base of the skull through the vertex without intravenous contrast. RADIATION DOSE REDUCTION: This exam was performed according to the departmental dose-optimization program which includes automated exposure control, adjustment of the mA and/or kV according to patient size and/or use of iterative reconstruction technique. COMPARISON:  None Available. FINDINGS: Brain: No evidence of acute infarction, hemorrhage, mass, mass effect, or midline shift. No hydrocephalus or extra-axial fluid collection. Vascular: No hyperdense vessel. Skull: Normal. Negative for fracture or focal lesion. Sinuses/Orbits: No acute finding. Status post bilateral lens replacements. Other:  The mastoid air cells are well aerated. No abnormality is seen posterior to the imaged left ear. IMPRESSION: No acute intracranial process. No abnormality is seen posterior to the imaged left ear. Electronically Signed   By: Merilyn Baba M.D.   On: 07/18/2022 17:59    Procedures Procedures    Medications Ordered in ED Medications  metoCLOPramide (REGLAN) injection 10 mg (10 mg Intravenous Given 07/18/22 1730)  diphenhydrAMINE (BENADRYL) injection 25 mg (25 mg Intravenous Given 07/18/22 1732)  sodium chloride 0.9 % bolus 1,000 mL (1,000 mLs Intravenous New Bag/Given 07/18/22 1729)    ED Course/ Medical Decision Making/ A&P   Patient seen and examined. History obtained directly from patient. Work-up including labs, imaging, EKG ordered in triage, if performed, were reviewed.    Labs/EKG: Independently reviewed and interpreted.  This included: CBC with elevated white blood cell count at 13.5; CMP glucose 129 otherwise unremarkable; pregnancy negative.  Imaging: Ordered head CT.  Medications/Fluids: Ordered: IV Reglan, IV Benadryl, fluid bolus  Most recent vital signs reviewed and are as follows: BP 109/66   Pulse 84   Temp 98.4 F (36.9 C)   Resp 14   Ht 5' (1.524 m)   Wt 85.7 kg   SpO2 100%   BMI 36.91 kg/m   Initial impression: Headache    Reassessment performed. Patient appears comfortable. States she is feeling somewhat better. She is anxious to go home.   Imaging personally visualized and interpreted including: Head CT, agree negative.   Reviewed pertinent lab work and imaging with patient at bedside. Questions answered.   Most current vital signs reviewed and are as follows: BP 109/66   Pulse 84   Temp 98.4 F (36.9 C)   Resp 14   Ht 5' (1.524 m)   Wt 85.7 kg   SpO2 100%   BMI 36.91 kg/m   Plan: Discharge to home.   Prescriptions written for: None  Other home care instructions discussed: Avoidance of triggers  ED return instructions discussed: Patient  counseled to return if they have weakness in their arms or legs, slurred  speech, trouble walking or talking, confusion, trouble with their balance, or if they have any other concerns. Patient verbalizes understanding and agrees with plan.   Follow-up instructions discussed: Patient encouraged to follow-up with their PCP in 3 days.                           Medical Decision Making Amount and/or Complexity of Data Reviewed Labs: ordered. Radiology: ordered.  Risk Prescription drug management.   In regards to the patient's headache, critical differentials were considered including subarachnoid hemorrhage, intracerebral hemorrhage, epidural/subdural hematoma, pituitary apoplexy, vertebral/carotid artery dissection, giant cell arteritis, central venous thrombosis, reversible cerebral vasoconstriction, acute angle closure glaucoma, idiopathic intracranial hypertension, bacterial meningitis, viral encephalitis, carbon monoxide poisoning, posterior reversible encephalopathy syndrome, pre-eclampsia.   Reg flag symptoms related to these causes were considered including systemic symptoms (fever, weight loss), neurologic symptoms (confusion, mental status change, vision change, associated seizure), acute or sudden "thunderclap" onset, patient age 35 or older with new or progressive headache, patient of any age with first headache or change in headache pattern, pregnant or postpartum status, history of HIV or other immunocompromise, history of cancer, headache occurring with exertion, associated neck or shoulder pain, associated traumatic injury, concurrent use of anticoagulation, family history of spontaneous SAH, and concurrent drug use.    Other benign, more common causes of headache were considered including migraine, tension-type headache, cluster headache, referred pain from other cause such as sinus infection, dental pain, trigeminal neuralgia.   On exam, patient has a reassuring neuro exam including  baseline mental status, no significant neck pain or meningeal signs, no signs of severe infection or fever.  CT head was negative without any concerning findings.  In regards to the ear, normal TMs bilaterally.  No mastoid tenderness to suggest mastoiditis.  No signs of cellulitis or external otitis.  Unclear cause of ear pain, but does not appear to be an emergent cause.  The patient's vital signs, pertinent lab work and imaging were reviewed and interpreted as discussed in the ED course. Hospitalization was considered for further testing, treatments, or serial exams/observation. However as patient is well-appearing, has a stable exam over the course of their evaluation, and reassuring studies today, I do not feel that they warrant admission at this time. This plan was discussed with the patient who verbalizes agreement and comfort with this plan and seems reliable and able to return to the Emergency Department with worsening or changing symptoms.          Final Clinical Impression(s) / ED Diagnoses Final diagnoses:  Acute nonintractable headache, unspecified headache type  Left ear pain    Rx / DC Orders ED Discharge Orders     None         Carlisle Cater, Hershal Coria 07/18/22 2049    Regan Lemming, MD 07/18/22 2121

## 2022-07-18 NOTE — Telephone Encounter (Signed)
Pt last OV was 07/07/22, will refill medication.  Requested Prescriptions  Pending Prescriptions Disp Refills  . DULoxetine (CYMBALTA) 60 MG capsule 90 capsule 0    Sig: Take 1 capsule (60 mg total) by mouth daily.     Psychiatry: Antidepressants - SNRI - duloxetine Failed - 07/17/2022 12:55 PM      Failed - Completed PHQ-2 or PHQ-9 in the last 360 days      Failed - Valid encounter within last 6 months    Recent Outpatient Visits          6 months ago Fatigue, unspecified type   [place], Cammie Mcgee, MD   1 year ago [place], Jessica A, NP   1 year ago [place] Eulogio Bear, NP   1 year ago Neck pain   [facility] Susy Frizzle, MD   1 year ago [place], Cammie Mcgee, MD             Passed - Cr in normal range and within 360 days    Creat  Date Value Ref Range Status  06/08/2022 0.80 0.50 - 1.03 mg/dL Final   Creatinine, Ser  Date Value Ref Range Status  06/13/2022 0.86 0.44 - 1.00 mg/dL Final         Passed - eGFR is 30 or above and within 360 days    GFR, Est African American  Date Value Ref Range Status  07/08/2019 101 > OR = 60 mL/min/1.52m Final   GFR, Est Non African American  Date Value Ref Range Status  07/08/2019 87 > OR = 60 mL/min/1.713mFinal   GFR, Estimated  Date Value Ref Range Status  06/13/2022 >60 >60 mL/min Final    Comment:    (NOTE) Calculated using the CKD-EPI Creatinine Equation (2021)    eGFR  Date Value Ref Range Status  06/08/2022 89 > OR = 60 mL/min/1.7330minal    Comment:    The eGFR is based on the CKD-EPI 2021 equation. To calculate  the new eGFR from a previous Creatinine or Cystatin C result, go to https://www.kidney.org/professionals/ kdoqi/[email or web address]          Passed - Last BP in normal range    BP Readings from Last 1 Encounters:  07/07/22  120/82

## 2022-07-18 NOTE — Discharge Instructions (Signed)
Please read and follow all provided instructions.  Your diagnoses today include:  1. Acute nonintractable headache, unspecified headache type   2. Left ear pain     Tests performed today include: CT of your head which was normal and did not show any serious cause of your headache Complete blood cell count: White blood cell count was high, no other issues Basic metabolic panel: No problems Pregnancy test (urine or blood, in women only): Negative Vital signs. See below for your results today.   Medications:  In the Emergency Department you received: Reglan - antinausea/headache medication Benadryl - antihistamine to counteract potential side effects of reglan  Take any prescribed medications only as directed.  Additional information:  Follow any educational materials contained in this packet.  You are having a headache. No specific cause was found today for your headache. It may have been a migraine or other cause of headache. Stress, anxiety, fatigue, and depression are common triggers for headaches.   Your headache today does not appear to be life-threatening or require hospitalization, but often the exact cause of headaches is not determined in the emergency department. Therefore, follow-up with your doctor is very important to find out what may have caused your headache and whether or not you need any further diagnostic testing or treatment.   Sometimes headaches can appear benign (not harmful), but then more serious symptoms can develop which should prompt an immediate re-evaluation by your doctor or the emergency department.  BE VERY CAREFUL not to take multiple medicines containing Tylenol (also called acetaminophen). Doing so can lead to an overdose which can damage your liver and cause liver failure and possibly death.   Follow-up instructions: Please follow-up with your primary care provider in the next 3 days for further evaluation of your symptoms.   Return instructions:   Please return to the Emergency Department if you experience worsening symptoms. Return if the medications do not resolve your headache, if it recurs, or if you have multiple episodes of vomiting or cannot keep down fluids. Return if you have a change from the usual headache. RETURN IMMEDIATELY IF you: Develop a sudden, severe headache Develop confusion or become poorly responsive or faint Develop a fever above 100.6F or problem breathing Have a change in speech, vision, swallowing, or understanding Develop new weakness, numbness, tingling, incoordination in your arms or legs Have a seizure Please return if you have any other emergent concerns.  Additional Information:  Your vital signs today were: BP 109/66   Pulse 84   Temp 98.4 F (36.9 C)   Resp 14   Ht 5' (1.524 m)   Wt 85.7 kg   SpO2 100%   BMI 36.91 kg/m  If your blood pressure (BP) was elevated above 135/85 this visit, please have this repeated by your doctor within one month. --------------

## 2022-07-19 ENCOUNTER — Other Ambulatory Visit: Payer: Self-pay | Admitting: Family Medicine

## 2022-07-20 NOTE — Telephone Encounter (Signed)
Requested medication (s) are due for refill today: yes  Requested medication (s) are on the active medication list: yes    Last refill: 06/23/22  #30  0 refills  Future visit scheduled no  Notes to clinic:Not delegated, please review. Thank you.  Requested Prescriptions  Pending Prescriptions Disp Refills   ALPRAZolam (XANAX) 0.5 MG tablet [Pharmacy Med Name: ALPRAZOLAM 0.5 MG TABLET] 30 tablet 0    Sig: TAKE 1 TABLET BY MOUTH 2 TIMES DAILY AS NEEDED FOR ANXIETY.     Not Delegated - Psychiatry: Anxiolytics/Hypnotics 2 Failed - 07/19/2022  7:59 PM      Failed - This refill cannot be delegated      Failed - Urine Drug Screen completed in last 360 days      Failed - Valid encounter within last 6 months    Recent Outpatient Visits           6 months ago Fatigue, unspecified type   [place], Cammie Mcgee, MD   1 year ago Palpitations   [place] Eulogio Bear, NP   1 year ago [place] Eulogio Bear, NP   1 year ago Neck pain   [place] Dennard Schaumann, Cammie Mcgee, MD   1 year ago [place], Cammie Mcgee, MD              Passed - Patient is not pregnant

## 2022-08-15 NOTE — Progress Notes (Unsigned)
Subjective:    Patient ID: [name], female    DOB: [birth year], 51 y.o.   MRN: [id number]  HPI ROANN MERK is a 51 y.o. female who complains of pelvic and lower back pressure, urinary frequency for 2 days. She felt "feverish" last night and reports these as typical UTI symptoms for her. Urine dipstick shows positive for leukocytes.  Micro exam: 6-10 WBC's per HPF and few+ bacteria.   Past Medical History:  Diagnosis Date   Angiolipoma of kidney    Arthritis    "hands" (09/02/2013)   B12 deficiency    Epicondylitis, lateral    Right   Fibromyalgia    GERD (gastroesophageal reflux disease)    IBS (irritable bowel syndrome)    Migraine headache    "maybe 6-8 times/yr" (09/02/2013)   Past Surgical History:  Procedure Laterality Date   APPENDECTOMY  12/11/1982   HYSTEROSCOPY  11/2021   PALATAL EXPANSION  12/11/2002   REDUCTION MAMMAPLASTY Bilateral ~ 2009   Current Outpatient Medications on File Prior to Visit  Medication Sig Dispense Refill   acetaminophen (TYLENOL) 500 MG tablet Take 1,000 mg by mouth every 6 (six) hours as needed for moderate pain or headache.     albuterol (VENTOLIN HFA) 108 (90 Base) MCG/ACT inhaler INHALE 2 PUFFS INTO THE LUNGS EVERY 6 HOURS AS NEEDED FOR WHEEZE OR SHORTNESS OF BREATH 8.5 each 2   ALPRAZolam (XANAX) 0.5 MG tablet TAKE 1 TABLET BY MOUTH 2 TIMES DAILY AS NEEDED FOR ANXIETY. 30 tablet 0   celecoxib (CELEBREX) 200 MG capsule Take 200 mg by mouth daily as needed for mild pain.     cyclobenzaprine (FLEXERIL) 10 MG tablet TAKE 1 TABLET BY MOUTH EVERY 8 HOURS AS NEEDED FOR MUSCLE SPASMS 60 tablet 1   diclofenac (VOLTAREN) 75 MG EC tablet TAKE 1 TABLET BY MOUTH TWICE A DAY 60 tablet 4   docusate sodium (COLACE) 100 MG capsule Take 200 mg by mouth daily as needed for mild constipation.     DULoxetine (CYMBALTA) 60 MG capsule Take 1 capsule (60 mg total) by mouth daily. 90 capsule 0   EMGALITY 120 MG/ML SOAJ INJECT 120 MG INTO THE SKIN  EVERY 30 (THIRTY) DAYS. 1 mL 11   hydrOXYzine (VISTARIL) 25 MG capsule Take 1 capsule (25 mg total) by mouth every 8 (eight) hours as needed for anxiety. Take first dose at night time and monitor for drowsiness.  If this  Medication makes you drowsy, do not take while driving or operating heavy machinery. 30 capsule 0   levofloxacin (LEVAQUIN) 500 MG tablet Take 1 tablet (500 mg total) by mouth daily. 7 tablet 0   LINZESS 290 MCG CAPS capsule TAKE 1 CAPSULE (290 MCG TOTAL) BY MOUTH DAILY BEFORE BREAKFAST. 30 capsule 3   nitrofurantoin, macrocrystal-monohydrate, (MACROBID) 100 MG capsule Take 1 capsule (100 mg total) by mouth 2 (two) times daily. 14 capsule 0   norethindrone (MICRONOR) 0.35 MG tablet Take 1 tablet by mouth at bedtime.     nystatin (MYCOSTATIN) 100000 UNIT/ML suspension Take 5 mLs (500,000 Units total) by mouth 4 (four) times daily. 60 mL 0   oxyCODONE-acetaminophen (PERCOCET) 10-325 MG tablet Take 1 tablet by mouth every 8 (eight) hours as needed for pain (for migraines). 30 tablet 0   phentermine (ADIPEX-P) 37.5 MG tablet Take 1 tablet (37.5 mg total) by mouth daily before breakfast. 30 tablet 2   Polyethyl Glycol-Propyl Glycol (SYSTANE OP) Apply 1 drop to eye daily as needed (  dry eyes).     promethazine (PHENERGAN) 25 MG tablet TAKE 1 TABELT BY MOUTH EVERY 4 HOURS 20 tablet 1   sodium chloride (OCEAN) 0.65 % SOLN nasal spray Place 1 spray into both nostrils as needed for congestion.     Current Facility-Administered Medications on File Prior to Visit  Medication Dose Route Frequency Provider Last Rate Last Admin   betamethasone acetate-betamethasone sodium phosphate (CELESTONE) injection 3 mg  3 mg Intra-articular Once Edrick Kins, DPM       Allergies  Allergen Reactions   Cefdinir Hives   Prednisone Rash     Only when takes in large doses   Macrolides And Ketolides     Headache,shortness of breath, heavy chest   Ceftin [Cefuroxime Axetil] Rash   Sulfa Antibiotics Rash       Review of Systems  Constitutional:  Positive for fever. Negative for chills and fatigue.  Gastrointestinal:  Negative for nausea and vomiting.  Genitourinary:  Positive for flank pain, frequency and pelvic pain. Negative for difficulty urinating, dysuria, hematuria, urgency and vaginal bleeding.  All other systems reviewed and are negative.      Objective:   Physical Exam Vitals and nursing note reviewed.  Constitutional:      Appearance: Normal appearance.  Abdominal:     General: Abdomen is flat. There is no distension.     Palpations: Abdomen is soft. There is no mass.     Tenderness: There is abdominal tenderness. There is right CVA tenderness and left CVA tenderness. There is no guarding or rebound.  Skin:    General: Skin is warm and dry.  Neurological:     Mental Status: She is alert.  Psychiatric:        Mood and Affect: Mood normal.        Assessment & Plan:    Dysuria - Plan: Urinalysis, Routine w reflex microscopic, Urine Culture  Acute cystitis without hematuria  PLAN: Will treat her UTI with Macrobid '100mg'$  BID for 7 days and send for culture. As per her previous plan with Dr Dennard Schaumann, if culture shows bacteria, I will consult urology for need for cystoscopy and evaluation for structural abnormality leading to recurrent UTIs.   Also instructed to push fluids, may use Pyridium OTC prn. Call or return to clinic prn if these symptoms worsen or fail to improve within 72 hours.

## 2022-08-16 ENCOUNTER — Ambulatory Visit (INDEPENDENT_AMBULATORY_CARE_PROVIDER_SITE_OTHER): Payer: BC Managed Care – PPO | Admitting: Family Medicine

## 2022-08-16 VITALS — BP 112/86 | HR 92 | Temp 97.8°F | Ht 64.0 in | Wt 192.0 lb

## 2022-08-16 DIAGNOSIS — R300 Dysuria: Secondary | ICD-10-CM | POA: Diagnosis not present

## 2022-08-16 DIAGNOSIS — N3000 Acute cystitis without hematuria: Secondary | ICD-10-CM

## 2022-08-16 LAB — URINALYSIS, ROUTINE W REFLEX MICROSCOPIC
Bilirubin Urine: NEGATIVE
Glucose, UA: NEGATIVE
Hgb urine dipstick: NEGATIVE
Hyaline Cast: NONE SEEN /LPF
Ketones, ur: NEGATIVE
Nitrite: NEGATIVE
Protein, ur: NEGATIVE
RBC / HPF: NONE SEEN /HPF (ref 0–2)
Specific Gravity, Urine: 1.015 (ref 1.001–1.035)
pH: 7.5 (ref 5.0–8.0)

## 2022-08-16 LAB — MICROSCOPIC MESSAGE

## 2022-08-16 MED ORDER — NITROFURANTOIN MONOHYD MACRO 100 MG PO CAPS: 100.0000 mg | ORAL_CAPSULE | Freq: Two times a day (BID) | ORAL | 0 refills | Status: DC

## 2022-08-18 ENCOUNTER — Ambulatory Visit (INDEPENDENT_AMBULATORY_CARE_PROVIDER_SITE_OTHER): Payer: BC Managed Care – PPO | Admitting: Family Medicine

## 2022-08-18 ENCOUNTER — Encounter: Payer: Self-pay | Admitting: Family Medicine

## 2022-08-18 VITALS — BP 124/90 | HR 112 | Temp 98.6°F | Ht 64.0 in | Wt 190.0 lb

## 2022-08-18 DIAGNOSIS — R140 Abdominal distension (gaseous): Secondary | ICD-10-CM

## 2022-08-18 DIAGNOSIS — R102 Pelvic and perineal pain: Secondary | ICD-10-CM | POA: Diagnosis not present

## 2022-08-18 LAB — URINALYSIS, ROUTINE W REFLEX MICROSCOPIC
Bacteria, UA: NONE SEEN /HPF
Bilirubin Urine: NEGATIVE
Glucose, UA: NEGATIVE
Hgb urine dipstick: NEGATIVE
Hyaline Cast: NONE SEEN /LPF
Ketones, ur: NEGATIVE
Nitrite: NEGATIVE
Protein, ur: NEGATIVE
RBC / HPF: NONE SEEN /HPF (ref 0–2)
Specific Gravity, Urine: 1.01 (ref 1.001–1.035)
pH: 7 (ref 5.0–8.0)

## 2022-08-18 LAB — WET PREP FOR TRICH, YEAST, CLUE

## 2022-08-18 LAB — MICROSCOPIC MESSAGE

## 2022-08-18 MED ORDER — MAGNESIUM CITRATE PO SOLN: 1.0000 | Freq: Once | ORAL | 0 refills | Status: AC

## 2022-08-18 NOTE — Progress Notes (Signed)
Subjective:    [name] ID: Andrea [name], female    DOB: 20-Feb-1971, 51 y.o.   MRN: [id number]  HPI [name] presents today with complaints of worsening pelvic pain and abdominal distention this AM. She had a headache last night and has also been experiencing body aches. She denies dysuria, frequency, hematuria, bloody stools, constipation, or diarrhea. Endorses yellowish vaginal discharge and history of BV. She is scheduled for a hysterectomy this month.  For reference purposes, OV 08/16/2022: Andrea [name] is a 51 y.o. female who complains of pelvic and lower back pressure, urinary frequency for 2 days. She felt "feverish" last night and reports these as typical UTI symptoms for her. Urine dipstick shows positive for leukocytes.  Micro exam: 6-10 WBC's per HPF and few+ bacteria.   Past Medical History:  Diagnosis Date   Angiolipoma of kidney    Arthritis    "hands" (09/02/2013)   B12 deficiency    Epicondylitis, lateral    Right   Fibromyalgia    GERD (gastroesophageal reflux disease)    IBS (irritable bowel syndrome)    Migraine headache    "maybe 6-8 times/yr" (09/02/2013)   Past Surgical History:  Procedure Laterality Date   APPENDECTOMY  12/11/1982   HYSTEROSCOPY  11/2021   PALATAL EXPANSION  12/11/2002   REDUCTION MAMMAPLASTY Bilateral ~ 2009   Current Outpatient Medications on File Prior to Visit  Medication Sig Dispense Refill   acetaminophen (TYLENOL) 500 MG tablet Take 1,000 mg by mouth every 6 (six) hours as needed for moderate pain or headache.     albuterol (VENTOLIN HFA) 108 (90 Base) MCG/ACT inhaler INHALE 2 PUFFS INTO THE LUNGS EVERY 6 HOURS AS NEEDED FOR WHEEZE OR SHORTNESS OF BREATH 8.5 each 2   ALPRAZolam (XANAX) 0.5 MG tablet TAKE 1 TABLET BY MOUTH 2 TIMES DAILY AS NEEDED FOR ANXIETY. 30 tablet 0   celecoxib (CELEBREX) 200 MG capsule Take 200 mg by mouth daily as needed for mild pain.     cyclobenzaprine (FLEXERIL) 10 MG tablet TAKE 1 TABLET BY MOUTH  EVERY 8 HOURS AS NEEDED FOR MUSCLE SPASMS 60 tablet 1   diclofenac (VOLTAREN) 75 MG EC tablet TAKE 1 TABLET BY MOUTH TWICE A DAY 60 tablet 4   docusate sodium (COLACE) 100 MG capsule Take 200 mg by mouth daily as needed for mild constipation.     DULoxetine (CYMBALTA) 60 MG capsule Take 1 capsule (60 mg total) by mouth daily. 90 capsule 0   EMGALITY 120 MG/ML SOAJ INJECT 120 MG INTO THE SKIN EVERY 30 (THIRTY) DAYS. 1 mL 11   hydrOXYzine (VISTARIL) 25 MG capsule Take 1 capsule (25 mg total) by mouth every 8 (eight) hours as needed for anxiety. Take first dose at night time and monitor for drowsiness.  If this  Medication makes you drowsy, do not take while driving or operating heavy machinery. 30 capsule 0   levofloxacin (LEVAQUIN) 500 MG tablet Take 1 tablet (500 mg total) by mouth daily. 7 tablet 0   LINZESS 290 MCG CAPS capsule TAKE 1 CAPSULE (290 MCG TOTAL) BY MOUTH DAILY BEFORE BREAKFAST. 30 capsule 3   nitrofurantoin, macrocrystal-monohydrate, (MACROBID) 100 MG capsule Take 1 capsule (100 mg total) by mouth 2 (two) times daily. 14 capsule 0   norethindrone (MICRONOR) 0.35 MG tablet Take 1 tablet by mouth at bedtime.     nystatin (MYCOSTATIN) 100000 UNIT/ML suspension Take 5 mLs (500,000 Units total) by mouth 4 (four) times daily. 60 mL 0  oxyCODONE-acetaminophen (PERCOCET) 10-325 MG tablet Take 1 tablet by mouth every 8 (eight) hours as needed for pain (for migraines). 30 tablet 0   phentermine (ADIPEX-P) 37.5 MG tablet Take 1 tablet (37.5 mg total) by mouth daily before breakfast. 30 tablet 2   Polyethyl Glycol-Propyl Glycol (SYSTANE OP) Apply 1 drop to eye daily as needed (dry eyes).     promethazine (PHENERGAN) 25 MG tablet TAKE 1 TABELT BY MOUTH EVERY 4 HOURS 20 tablet 1   sodium chloride (OCEAN) 0.65 % SOLN nasal spray Place 1 spray into both nostrils as needed for congestion.     Current Facility-Administered Medications on File Prior to Visit  Medication Dose Route Frequency Provider  Last Rate Last Admin   betamethasone acetate-betamethasone sodium phosphate (CELESTONE) injection 3 mg  3 mg Intra-articular Once Edrick Kins, DPM       Allergies  Allergen Reactions   Cefdinir Hives   Prednisone Rash     Only when takes in large doses   Macrolides And Ketolides     Headache,shortness of breath, heavy chest   Ceftin [Cefuroxime Axetil] Rash   Sulfa Antibiotics Rash      Review of Systems  Constitutional:  Negative for chills, fatigue and fever.  Gastrointestinal:  Positive for abdominal distention and abdominal pain. Negative for blood in stool, constipation, diarrhea, nausea and vomiting.  Genitourinary:  Positive for flank pain, pelvic pain and vaginal discharge. Negative for decreased urine volume, difficulty urinating, dysuria, frequency, hematuria, urgency and vaginal bleeding.  All other systems reviewed and are negative.      Objective:   Physical Exam Vitals and nursing note reviewed.  Constitutional:      Appearance: Normal appearance.  Abdominal:     General: Abdomen is flat. Bowel sounds are normal. There is no distension.     Palpations: Abdomen is soft. There is no mass.     Tenderness: There is abdominal tenderness. There is no guarding or rebound.  Genitourinary:    General: Normal vulva.     Vagina: Vaginal discharge present.     Rectum: Normal.  Skin:    General: Skin is warm and dry.  Neurological:     Mental Status: She is alert.  Psychiatric:        Mood and Affect: Mood normal.        Assessment & Plan:   Pelvic pain - Plan: WET PREP FOR TRICH, YEAST, CLUE, Urinalysis, Routine w reflex microscopic, Urine Culture  Abdominal distention  UTI is improving with course of Macrobid, will continue full course. Wet prep was performed to rule out bacterial vaginosis and no clue cell were present. She denies sexual activity and declines STD testing. Recent colonoscopy showed no diverticulosis, however she did have a large stool burden  remaining after bowel prep. I suspect she may be experiencing constipation. Will order Magnesium Citrate. If she gets no relief from this or symptoms worsen will obtain imaging for further evaluation. Instructed to seek care if symptoms worsen or persist or she begins experiencing fevers, chills, night sweats.

## 2022-08-18 NOTE — Telephone Encounter (Signed)
Pt came in today to see Mila Merry, NP. Dr. Dennard Schaumann also aware.

## 2022-08-19 LAB — URINE CULTURE
MICRO NUMBER:: 13891498
Result:: NO GROWTH
SPECIMEN QUALITY:: ADEQUATE

## 2022-08-21 ENCOUNTER — Encounter: Payer: Self-pay | Admitting: Family Medicine

## 2022-08-23 ENCOUNTER — Encounter (HOSPITAL_BASED_OUTPATIENT_CLINIC_OR_DEPARTMENT_OTHER): Payer: Self-pay | Admitting: Obstetrics and Gynecology

## 2022-08-29 ENCOUNTER — Other Ambulatory Visit: Payer: Self-pay

## 2022-08-29 ENCOUNTER — Encounter (HOSPITAL_BASED_OUTPATIENT_CLINIC_OR_DEPARTMENT_OTHER): Payer: Self-pay | Admitting: Obstetrics and Gynecology

## 2022-08-29 NOTE — Progress Notes (Signed)
Your procedure is scheduled on Thursday, 09/07/22.  Report to [facility]   Call this number if you have problems the morning of surgery  :[phone].   OUR ADDRESS IS [place].  WE ARE LOCATED IN [facility].  PLEASE BRING YOUR INSURANCE CARD AND PHOTO ID DAY OF SURGERY.  ONLY 2 PEOPLE ARE ALLOWED IN  WAITING  ROOM.                                      REMEMBER:  DO NOT EAT FOOD, CANDY GUM OR MINTS  AFTER MIDNIGHT THE NIGHT BEFORE YOUR SURGERY . YOU MAY HAVE CLEAR LIQUIDS FROM MIDNIGHT THE NIGHT BEFORE YOUR SURGERY UNTIL  4:30 AM. NO CLEAR LIQUIDS AFTER   4:30 AM DAY OF SURGERY.  YOU MAY  BRUSH YOUR TEETH MORNING OF SURGERY AND RINSE YOUR MOUTH OUT, NO CHEWING GUM CANDY OR MINTS.     CLEAR LIQUID DIET   Foods Allowed                                                                     Foods Excluded  Coffee and tea, regular and decaf                             liquids that you cannot  Plain Jell-O                                                                   see through such as: Fruit ices (not with fruit pulp)                                     milk, soups, orange juice  Plain  Popsicles                                    All solid food Carbonated beverages, regular and diet                                    Cranberry, grape and apple juices Sports drinks like Gatorade _____________________________________________________________________     TAKE THESE MEDICATIONS MORNING OF SURGERY: Albuterol inhaler if needed, Percocet if needed, Phenergan if needed, Systane eye drops if needed, Saline nasal spray if needed  Please bring inhaler with you on the day of surgery.  DO NOT TAKE EMGALITY ON THE DAY OF SURGERY.  STOP TAKING PHENTERMINE AS OF 08/29/22.    UP TO 4 VISITORS  MAY VISIT IN THE EXTENDED RECOVERY ROOM UNTIL 800 PM ONLY.  ONE  VISITOR AGE 69 AND OVER MAY  SPEND THE NIGHT AND MUST BE IN EXTENDED  RECOVERY ROOM NO LATER THAN 800 PM . YOUR DISCHARGE TIME AFTER YOU SPEND THE NIGHT IS 900 AM THE MORNING AFTER YOUR SURGERY.  YOU MAY PACK A SMALL OVERNIGHT BAG WITH TOILETRIES FOR YOUR OVERNIGHT STAY IF YOU WISH.  YOUR PRESCRIPTION MEDICATIONS WILL BE PROVIDED DURING [place].                                      DO NOT WEAR JEWERLY, MAKE UP. DO NOT WEAR LOTIONS, POWDERS, PERFUMES OR NAIL POLISH ON YOUR FINGERNAILS. TOENAIL POLISH IS OK TO WEAR. DO NOT SHAVE FOR 48 HOURS PRIOR TO DAY OF SURGERY. MEN MAY SHAVE FACE AND NECK. CONTACTS, GLASSES, OR DENTURES MAY NOT BE WORN TO SURGERY.  REMEMBER: NO SMOKING, DRUGS OR ALCOHOL FOR 24 HOURS BEFORE YOUR SURGERY.                                    [facility] IS NOT RESPONSIBLE  FOR ANY BELONGINGS.                                                                    Marland Kitchen           [facility] - Preparing for Surgery Before surgery, you can play an important role.  Because skin is not sterile, your skin needs to be as free of germs as possible.  You can reduce the number of germs on your skin by washing with CHG (chlorahexidine gluconate) soap before surgery.  CHG is an antiseptic cleaner which kills germs and bonds with the skin to continue killing germs even after washing. Please DO NOT use if you have an allergy to CHG or antibacterial soaps.  If your skin becomes reddened/irritated stop using the CHG and inform your nurse when you arrive at Short Stay. Do not shave (including legs and underarms) for at least 48 hours prior to the first CHG shower.  You may shave your face/neck. Please follow these instructions carefully:  1.  Shower with CHG Soap the night before surgery and the  morning of Surgery.  2.  If you choose to wash your hair, wash your hair first as usual with your  normal  shampoo.  3.  After you shampoo, rinse your hair and body thoroughly to remove the  shampoo.                            4.  Use CHG as you would any other  liquid soap.  You can apply chg directly  to the skin and wash , please wash your belly button thoroughly with chg soap provided night before and morning of your surgery.                     Gently with a scrungie or clean washcloth.  5.  Apply the CHG Soap to your body ONLY FROM THE NECK DOWN.   Do not use on face/ open  Wound or open sores. Avoid contact with eyes, ears mouth and genitals (private parts).                       Wash face,  Genitals (private parts) with your normal soap.             6.  Wash thoroughly, paying special attention to the area where your surgery  will be performed.  7.  Thoroughly rinse your body with warm water from the neck down.  8.  DO NOT shower/wash with your normal soap after using and rinsing off  the CHG Soap.                9.  Pat yourself dry with a clean towel.            10.  Wear clean pajamas.            11.  Place clean sheets on your bed the night of your first shower and do not  sleep with pets. Day of Surgery : Do not apply any lotions/deodorants the morning of surgery.  Please wear clean clothes to the hospital/surgery center.  IF YOU HAVE ANY SKIN IRRITATION OR PROBLEMS WITH THE SURGICAL SOAP, PLEASE GET A BAR OF GOLD DIAL SOAP AND SHOWER THE NIGHT BEFORE YOUR SURGERY AND THE MORNING OF YOUR SURGERY. PLEASE LET THE NURSE KNOW MORNING OF YOUR SURGERY IF YOU HAD ANY PROBLEMS WITH THE SURGICAL SOAP.   ________________________________________________________________________                                                        QUESTIONS [facility] PRE OP NURSE PHONE [phone].

## 2022-08-29 NOTE — Progress Notes (Signed)
Spoke w/ via phone for pre-op interview---Andrea [name] needs dos---- urine pregnancy per anesthesia, surgeon orders pending as of 08/29/22              [name] results------08/30/22 [name] appt for cbc, type & screen, 06/13/22 Ekg in Epic & chart, 06/13/22 Chest xray in [place] test -----patient states asymptomatic no test needed Arrive at -------0530 on Thursday, 09/07/22 NPO after MN NO Solid Food.  Clear liquids from MN until---0430 Med rec completed Medications to take morning of surgery ----- Albuterol prn (please bring), Percocet prn, Phenergan prn, Systane eye gtts prn, Saline nasal spray prn Do not take Emgality on day of surgery. Patient instructed to stop taking Phentermine on 08/29/22. Patient stated that she had not taken Phentermine in a few weeks as of 08/29/22. Diabetic medication -----n/a Patient instructed no nail polish to be worn day of surgery Patient instructed to bring photo id and insurance card day of surgery Patient aware to have Driver (ride ) / caregiver    for 24 hours after surgery - husband Andrea [name] Patient Special Instructions -----Extended / overnight stay instructions given. Pre-Op special Istructions -----Requested orders via Epic IB on 08/22/22. Patient verbalized understanding of instructions that were given at this phone interview. Patient denies shortness of breath, chest pain, fever, cough at this phone interview.

## 2022-08-30 ENCOUNTER — Encounter (HOSPITAL_COMMUNITY)
Admission: RE | Admit: 2022-08-30 | Discharge: 2022-08-30 | Disposition: A | Discharge status: Home / Self Care | Payer: BC Managed Care – PPO | Source: Ambulatory Visit | Attending: Obstetrics and Gynecology | Admitting: Obstetrics and Gynecology

## 2022-08-30 DIAGNOSIS — Z01818 Encounter for other preprocedural examination: Secondary | ICD-10-CM

## 2022-08-30 DIAGNOSIS — Z01812 Encounter for preprocedural laboratory examination: Secondary | ICD-10-CM | POA: Insufficient documentation

## 2022-08-30 LAB — CBC
HCT: 40.1 % (ref 36.0–46.0)
Hemoglobin: 12.2 g/dL (ref 12.0–15.0)
MCH: 24.4 pg — ABNORMAL LOW (ref 26.0–34.0)
MCHC: 30.4 g/dL (ref 30.0–36.0)
MCV: 80.2 fL (ref 80.0–100.0)
Platelets: 285 10*3/uL (ref 150–400)
RBC: 5 MIL/uL (ref 3.87–5.11)
RDW: 14.3 % (ref 11.5–15.5)
WBC: 8.4 10*3/uL (ref 4.0–10.5)
nRBC: 0 % (ref 0.0–0.2)

## 2022-08-31 ENCOUNTER — Other Ambulatory Visit: Payer: Self-pay | Admitting: Family Medicine

## 2022-09-01 ENCOUNTER — Other Ambulatory Visit: Payer: Self-pay | Admitting: Family Medicine

## 2022-09-01 MED ORDER — OXYCODONE-ACETAMINOPHEN 10-325 MG PO TABS: 1.0000 | ORAL_TABLET | Freq: Three times a day (TID) | ORAL | 0 refills | Status: DC | PRN

## 2022-09-01 NOTE — Telephone Encounter (Signed)
Requested medication (s) are due for refill today -yes  Requested medication (s) are on the active medication list -yes  Future visit scheduled -no  Last refill: 07/20/22 #30  Notes to clinic: non delegated Rx  Requested Prescriptions  Pending Prescriptions Disp Refills   ALPRAZolam (XANAX) 0.5 MG tablet [Pharmacy Med Name: ALPRAZOLAM 0.5 MG TABLET] 30 tablet 0    Sig: TAKE 1 TABLET BY MOUTH TWICE A DAY AS NEEDED FOR ANXIETY     Not Delegated - Psychiatry: Anxiolytics/Hypnotics 2 Failed - 08/31/2022  7:58 PM      Failed - This refill cannot be delegated      Failed - Urine Drug Screen completed in last 360 days      Failed - Valid encounter within last 6 months    Recent Outpatient Visits           8 months ago Fatigue, unspecified type   [place] Pickard, Cammie Mcgee, MD   1 year ago Palpitations   [place], Jessica A, NP   1 year ago [place], Jessica A, NP   1 year ago Neck pain   [place] Dennard Schaumann, Cammie Mcgee, MD   1 year ago [place], Cammie Mcgee, MD              Passed - Patient is not pregnant         Requested Prescriptions  Pending Prescriptions Disp Refills   ALPRAZolam (XANAX) 0.5 MG tablet [Pharmacy Med Name: ALPRAZOLAM 0.5 MG TABLET] 30 tablet 0    Sig: TAKE 1 TABLET BY MOUTH TWICE A DAY AS NEEDED FOR ANXIETY     Not Delegated - Psychiatry: Anxiolytics/Hypnotics 2 Failed - 08/31/2022  7:58 PM      Failed - This refill cannot be delegated      Failed - Urine Drug Screen completed in last 360 days      Failed - Valid encounter within last 6 months    Recent Outpatient Visits           8 months ago Fatigue, unspecified type   [place] Susy Frizzle, MD   1 year ago Palpitations   [place], Jessica A, NP   1 year ago [place] Eulogio Bear, NP   1 year ago Neck pain   [place] Dennard Schaumann, Cammie Mcgee, MD   1 year ago [place], Cammie Mcgee, MD              Passed - Patient is not pregnant

## 2022-09-06 ENCOUNTER — Encounter (HOSPITAL_BASED_OUTPATIENT_CLINIC_OR_DEPARTMENT_OTHER): Payer: Self-pay | Admitting: Obstetrics and Gynecology

## 2022-09-06 NOTE — Anesthesia Preprocedure Evaluation (Signed)
Anesthesia Evaluation  Patient identified by MRN, date of birth, ID band Patient awake    Reviewed: Allergy & Precautions, NPO status , Patient's Chart, lab work & pertinent test results  Airway Mallampati: I  TM Distance: >3 FB Neck ROM: Full    Dental  (+) Teeth Intact, Dental Advisory Given   Pulmonary asthma ,    breath sounds clear to auscultation       Cardiovascular negative cardio ROS   Rhythm:Regular Rate:Normal     Neuro/Psych  Headaches, Anxiety    GI/Hepatic Neg liver ROS, GERD  ,  Endo/Other  negative endocrine ROS  Renal/GU Renal disease     Musculoskeletal  (+) Arthritis , Fibromyalgia -  Abdominal Normal abdominal exam  (+)   Peds  Hematology negative hematology ROS (+)   Anesthesia Other Findings   Reproductive/Obstetrics                            Anesthesia Physical Anesthesia Plan  ASA: 2  Anesthesia Plan: General   Post-op Pain Management:    Induction: Intravenous  PONV Risk Score and Plan: 4 or greater and Ondansetron, Dexamethasone, Midazolam and Scopolamine patch - Pre-op  Airway Management Planned: Oral ETT  Additional Equipment: None  Intra-op Plan:   Post-operative Plan: Extubation in OR  Informed Consent: I have reviewed the patients History and Physical, chart, labs and discussed the procedure including the risks, benefits and alternatives for the proposed anesthesia with the patient or authorized representative who has indicated his/her understanding and acceptance.     Dental advisory given  Plan Discussed with: CRNA  Anesthesia Plan Comments:        Anesthesia Quick Evaluation

## 2022-09-07 ENCOUNTER — Encounter (HOSPITAL_BASED_OUTPATIENT_CLINIC_OR_DEPARTMENT_OTHER): Payer: Self-pay | Admitting: Obstetrics and Gynecology

## 2022-09-07 ENCOUNTER — Other Ambulatory Visit: Payer: Self-pay

## 2022-09-07 ENCOUNTER — Ambulatory Visit (HOSPITAL_BASED_OUTPATIENT_CLINIC_OR_DEPARTMENT_OTHER)
Admission: RE | Admit: 2022-09-07 | Discharge: 2022-09-08 | Disposition: A | Discharge status: Home / Self Care | Payer: BC Managed Care – PPO | Source: Ambulatory Visit | Attending: Obstetrics and Gynecology | Admitting: Obstetrics and Gynecology

## 2022-09-07 ENCOUNTER — Ambulatory Visit (HOSPITAL_BASED_OUTPATIENT_CLINIC_OR_DEPARTMENT_OTHER): Payer: BC Managed Care – PPO | Admitting: Anesthesiology

## 2022-09-07 ENCOUNTER — Encounter (HOSPITAL_BASED_OUTPATIENT_CLINIC_OR_DEPARTMENT_OTHER)
Admission: RE | Disposition: A | Discharge status: Home / Self Care | Payer: Self-pay | Source: Ambulatory Visit | Attending: Obstetrics and Gynecology

## 2022-09-07 DIAGNOSIS — G43909 Migraine, unspecified, not intractable, without status migrainosus: Secondary | ICD-10-CM | POA: Insufficient documentation

## 2022-09-07 DIAGNOSIS — K589 Irritable bowel syndrome without diarrhea: Secondary | ICD-10-CM | POA: Diagnosis not present

## 2022-09-07 DIAGNOSIS — J45909 Unspecified asthma, uncomplicated: Secondary | ICD-10-CM | POA: Insufficient documentation

## 2022-09-07 DIAGNOSIS — Z79899 Other long term (current) drug therapy: Secondary | ICD-10-CM | POA: Insufficient documentation

## 2022-09-07 DIAGNOSIS — N939 Abnormal uterine and vaginal bleeding, unspecified: Secondary | ICD-10-CM | POA: Insufficient documentation

## 2022-09-07 DIAGNOSIS — F419 Anxiety disorder, unspecified: Secondary | ICD-10-CM | POA: Insufficient documentation

## 2022-09-07 DIAGNOSIS — Z01818 Encounter for other preprocedural examination: Secondary | ICD-10-CM

## 2022-09-07 DIAGNOSIS — M797 Fibromyalgia: Secondary | ICD-10-CM | POA: Insufficient documentation

## 2022-09-07 HISTORY — DX: Personal history of urinary (tract) infections: Z87.440

## 2022-09-07 HISTORY — DX: Anxiety disorder, unspecified: F41.9

## 2022-09-07 HISTORY — PX: LAPAROSCOPIC BILATERAL SALPINGECTOMY: SHX5889

## 2022-09-07 HISTORY — DX: Abnormal uterine and vaginal bleeding, unspecified: N93.9

## 2022-09-07 HISTORY — DX: Unspecified asthma, uncomplicated: J45.909

## 2022-09-07 HISTORY — PX: LAPAROSCOPIC SUPRACERVICAL HYSTERECTOMY: SHX5399

## 2022-09-07 HISTORY — PX: CYSTOSCOPY: SHX5120

## 2022-09-07 LAB — POCT PREGNANCY, URINE: Preg Test, Ur: NEGATIVE

## 2022-09-07 LAB — TYPE AND SCREEN
ABO/RH(D): O POS
Antibody Screen: NEGATIVE

## 2022-09-07 LAB — ABO/RH: ABO/RH(D): O POS

## 2022-09-07 SURGERY — HYSTERECTOMY, SUPRACERVICAL, LAPAROSCOPIC
Anesthesia: General | Site: Pelvis

## 2022-09-07 MED ORDER — FLUORESCEIN SODIUM 10 % IV SOLN
INTRAVENOUS | Status: DC | PRN
  Administered 2022-09-07: 1 mL via INTRAVENOUS

## 2022-09-07 MED ORDER — ROCURONIUM BROMIDE 10 MG/ML (PF) SYRINGE
PREFILLED_SYRINGE | INTRAVENOUS | Status: DC | PRN
  Administered 2022-09-07 (×2): 10 mg via INTRAVENOUS
  Administered 2022-09-07: 60 mg via INTRAVENOUS
  Administered 2022-09-07: 10 mg via INTRAVENOUS

## 2022-09-07 MED ORDER — STERILE WATER FOR IRRIGATION IR SOLN
Status: DC | PRN
  Administered 2022-09-07: 500 mL

## 2022-09-07 MED ORDER — LIDOCAINE HCL (PF) 2 % IJ SOLN
INTRAMUSCULAR | Status: AC
  Filled 2022-09-07: qty 5

## 2022-09-07 MED ORDER — CYCLOBENZAPRINE HCL 10 MG PO TABS: 10.0000 mg | ORAL_TABLET | Freq: Three times a day (TID) | ORAL | Status: DC | PRN

## 2022-09-07 MED ORDER — KETOROLAC TROMETHAMINE 30 MG/ML IJ SOLN
INTRAMUSCULAR | Status: AC
  Filled 2022-09-07: qty 1

## 2022-09-07 MED ORDER — ACETAMINOPHEN 160 MG/5ML PO SOLN: 325.0000 mg | ORAL | Status: DC | PRN

## 2022-09-07 MED ORDER — SCOPOLAMINE 1 MG/3DAYS TD PT72
MEDICATED_PATCH | TRANSDERMAL | Status: AC
  Filled 2022-09-07: qty 1

## 2022-09-07 MED ORDER — DOCUSATE SODIUM 100 MG PO CAPS: 200.0000 mg | ORAL_CAPSULE | Freq: Every day | ORAL | Status: DC | PRN

## 2022-09-07 MED ORDER — WHITE PETROLATUM EX OINT
TOPICAL_OINTMENT | CUTANEOUS | Status: AC
  Filled 2022-09-07: qty 5

## 2022-09-07 MED ORDER — DEXMEDETOMIDINE HCL IN NACL 80 MCG/20ML IV SOLN
INTRAVENOUS | Status: AC
  Filled 2022-09-07: qty 20

## 2022-09-07 MED ORDER — DEXAMETHASONE SODIUM PHOSPHATE 10 MG/ML IJ SOLN
INTRAMUSCULAR | Status: AC
  Filled 2022-09-07: qty 1

## 2022-09-07 MED ORDER — SCOPOLAMINE 1 MG/3DAYS TD PT72
1.0000 | MEDICATED_PATCH | TRANSDERMAL | Status: DC
  Administered 2022-09-07: 1.5 mg via TRANSDERMAL

## 2022-09-07 MED ORDER — ACETAMINOPHEN 500 MG PO TABS
1000.0000 mg | ORAL_TABLET | ORAL | Status: AC
  Administered 2022-09-07: 1000 mg via ORAL

## 2022-09-07 MED ORDER — OXYCODONE HCL 5 MG PO TABS: 5.0000 mg | ORAL_TABLET | Freq: Once | ORAL | Status: DC | PRN

## 2022-09-07 MED ORDER — FENTANYL CITRATE (PF) 100 MCG/2ML IJ SOLN
INTRAMUSCULAR | Status: AC
  Filled 2022-09-07: qty 2

## 2022-09-07 MED ORDER — KETOROLAC TROMETHAMINE 30 MG/ML IJ SOLN
INTRAMUSCULAR | Status: DC | PRN
  Administered 2022-09-07: 30 mg via INTRAVENOUS

## 2022-09-07 MED ORDER — MIDAZOLAM HCL 2 MG/2ML IJ SOLN
INTRAMUSCULAR | Status: AC
  Filled 2022-09-07: qty 2

## 2022-09-07 MED ORDER — ONDANSETRON HCL 4 MG/2ML IJ SOLN
INTRAMUSCULAR | Status: DC | PRN
  Administered 2022-09-07: 4 mg via INTRAVENOUS

## 2022-09-07 MED ORDER — ONDANSETRON HCL 4 MG/2ML IJ SOLN: 4.0000 mg | Freq: Four times a day (QID) | INTRAMUSCULAR | Status: DC | PRN

## 2022-09-07 MED ORDER — OXYCODONE HCL 5 MG PO TABS
ORAL_TABLET | ORAL | Status: AC
  Filled 2022-09-07: qty 2

## 2022-09-07 MED ORDER — DEXAMETHASONE SODIUM PHOSPHATE 10 MG/ML IJ SOLN
INTRAMUSCULAR | Status: DC | PRN
  Administered 2022-09-07 (×2): 5 mg via INTRAVENOUS

## 2022-09-07 MED ORDER — LACTATED RINGERS IV SOLN: INTRAVENOUS | Status: DC

## 2022-09-07 MED ORDER — AMISULPRIDE (ANTIEMETIC) 5 MG/2ML IV SOLN
10.0000 mg | Freq: Once | INTRAVENOUS | Status: AC | PRN
  Administered 2022-09-07: 10 mg via INTRAVENOUS

## 2022-09-07 MED ORDER — ACETAMINOPHEN 500 MG PO TABS
ORAL_TABLET | ORAL | Status: AC
  Filled 2022-09-07: qty 2

## 2022-09-07 MED ORDER — AMISULPRIDE (ANTIEMETIC) 5 MG/2ML IV SOLN
INTRAVENOUS | Status: AC
  Filled 2022-09-07: qty 4

## 2022-09-07 MED ORDER — GENTAMICIN SULFATE 40 MG/ML IJ SOLN
5.0000 mg/kg | INTRAVENOUS | Status: AC
  Administered 2022-09-07: 430 mg via INTRAVENOUS
  Filled 2022-09-07: qty 10.75

## 2022-09-07 MED ORDER — METRONIDAZOLE 500 MG/100ML IV SOLN
500.0000 mg | INTRAVENOUS | Status: AC
  Administered 2022-09-07: 500 mg via INTRAVENOUS

## 2022-09-07 MED ORDER — FLUORESCEIN SODIUM 10 % IV SOLN
INTRAVENOUS | Status: AC
  Filled 2022-09-07: qty 5

## 2022-09-07 MED ORDER — OXYCODONE HCL 5 MG PO TABS
5.0000 mg | ORAL_TABLET | ORAL | Status: DC | PRN
  Administered 2022-09-07: 10 mg via ORAL
  Administered 2022-09-07: 5 mg via ORAL
  Administered 2022-09-07: 10 mg via ORAL
  Administered 2022-09-08: 5 mg via ORAL
  Administered 2022-09-08: 10 mg via ORAL

## 2022-09-07 MED ORDER — ALPRAZOLAM 0.5 MG PO TABS: 0.5000 mg | ORAL_TABLET | Freq: Two times a day (BID) | ORAL | Status: DC | PRN

## 2022-09-07 MED ORDER — ACETAMINOPHEN 325 MG PO TABS: 325.0000 mg | ORAL_TABLET | ORAL | Status: DC | PRN

## 2022-09-07 MED ORDER — SENNA 8.6 MG PO TABS
ORAL_TABLET | ORAL | Status: AC
  Filled 2022-09-07: qty 1

## 2022-09-07 MED ORDER — SIMETHICONE 80 MG PO CHEW: 80.0000 mg | CHEWABLE_TABLET | Freq: Four times a day (QID) | ORAL | Status: DC | PRN

## 2022-09-07 MED ORDER — FENTANYL CITRATE (PF) 100 MCG/2ML IJ SOLN
25.0000 ug | INTRAMUSCULAR | Status: DC | PRN
  Administered 2022-09-07 (×2): 25 ug via INTRAVENOUS

## 2022-09-07 MED ORDER — DULOXETINE HCL 60 MG PO CPEP
60.0000 mg | ORAL_CAPSULE | Freq: Every day | ORAL | Status: DC
  Administered 2022-09-07: 60 mg via ORAL
  Filled 2022-09-07 (×2): qty 1

## 2022-09-07 MED ORDER — PROPOFOL 10 MG/ML IV BOLUS
INTRAVENOUS | Status: AC
  Filled 2022-09-07: qty 20

## 2022-09-07 MED ORDER — PHENYLEPHRINE HCL (PRESSORS) 10 MG/ML IV SOLN
INTRAVENOUS | Status: DC | PRN
  Administered 2022-09-07 (×2): 80 ug via INTRAVENOUS

## 2022-09-07 MED ORDER — MIDAZOLAM HCL 2 MG/2ML IJ SOLN
INTRAMUSCULAR | Status: DC | PRN
  Administered 2022-09-07: 2 mg via INTRAVENOUS

## 2022-09-07 MED ORDER — VASOPRESSIN 20 UNIT/ML IV SOLN
INTRAVENOUS | Status: DC | PRN
  Administered 2022-09-07: 10 mL via INTRAMUSCULAR

## 2022-09-07 MED ORDER — DEXMEDETOMIDINE HCL IN NACL 80 MCG/20ML IV SOLN
INTRAVENOUS | Status: DC | PRN
  Administered 2022-09-07: 4 ug via BUCCAL
  Administered 2022-09-07: 8 ug via BUCCAL

## 2022-09-07 MED ORDER — METRONIDAZOLE 500 MG/100ML IV SOLN
INTRAVENOUS | Status: AC
  Filled 2022-09-07: qty 100

## 2022-09-07 MED ORDER — OXYCODONE HCL 5 MG/5ML PO SOLN: 5.0000 mg | Freq: Once | ORAL | Status: DC | PRN

## 2022-09-07 MED ORDER — PROPOFOL 10 MG/ML IV BOLUS
INTRAVENOUS | Status: DC | PRN
  Administered 2022-09-07: 140 mg via INTRAVENOUS

## 2022-09-07 MED ORDER — BUPIVACAINE HCL (PF) 0.25 % IJ SOLN
INTRAMUSCULAR | Status: DC | PRN
  Administered 2022-09-07: 19 mL

## 2022-09-07 MED ORDER — KETOROLAC TROMETHAMINE 30 MG/ML IJ SOLN
30.0000 mg | Freq: Once | INTRAMUSCULAR | Status: AC
  Administered 2022-09-07: 30 mg via INTRAVENOUS

## 2022-09-07 MED ORDER — ACETAMINOPHEN 10 MG/ML IV SOLN: 1000.0000 mg | Freq: Once | INTRAVENOUS | Status: DC | PRN

## 2022-09-07 MED ORDER — POVIDONE-IODINE 10 % EX SWAB: 2.0000 | Freq: Once | CUTANEOUS | Status: DC

## 2022-09-07 MED ORDER — LIDOCAINE 2% (20 MG/ML) 5 ML SYRINGE
INTRAMUSCULAR | Status: DC | PRN
  Administered 2022-09-07: 60 mg via INTRAVENOUS

## 2022-09-07 MED ORDER — ACETAMINOPHEN 500 MG PO TABS
1000.0000 mg | ORAL_TABLET | Freq: Four times a day (QID) | ORAL | Status: DC
  Administered 2022-09-07 – 2022-09-08 (×4): 1000 mg via ORAL

## 2022-09-07 MED ORDER — ONDANSETRON HCL 4 MG PO TABS: 4.0000 mg | ORAL_TABLET | Freq: Four times a day (QID) | ORAL | Status: DC | PRN

## 2022-09-07 MED ORDER — ONDANSETRON HCL 4 MG/2ML IJ SOLN
INTRAMUSCULAR | Status: AC
  Filled 2022-09-07: qty 2

## 2022-09-07 MED ORDER — PROMETHAZINE HCL 25 MG/ML IJ SOLN: 6.2500 mg | INTRAMUSCULAR | Status: DC | PRN

## 2022-09-07 MED ORDER — SODIUM CHLORIDE 0.9 % IR SOLN
Status: DC | PRN
  Administered 2022-09-07: 800 mL
  Administered 2022-09-07: 300 mL

## 2022-09-07 MED ORDER — ROCURONIUM BROMIDE 10 MG/ML (PF) SYRINGE
PREFILLED_SYRINGE | INTRAVENOUS | Status: AC
  Filled 2022-09-07: qty 10

## 2022-09-07 MED ORDER — SUGAMMADEX SODIUM 200 MG/2ML IV SOLN
INTRAVENOUS | Status: DC | PRN
  Administered 2022-09-07: 200 mg via INTRAVENOUS

## 2022-09-07 MED ORDER — FENTANYL CITRATE (PF) 100 MCG/2ML IJ SOLN
INTRAMUSCULAR | Status: DC | PRN
  Administered 2022-09-07 (×2): 50 ug via INTRAVENOUS

## 2022-09-07 MED ORDER — SENNA 8.6 MG PO TABS
1.0000 | ORAL_TABLET | Freq: Two times a day (BID) | ORAL | Status: DC
  Administered 2022-09-07 (×2): 8.6 mg via ORAL

## 2022-09-07 SURGICAL SUPPLY — 60 items
ADH SKN CLS APL DERMABOND .7 (GAUZE/BANDAGES/DRESSINGS) ×3
BAG DECANTER FOR FLEXI CONT (MISCELLANEOUS) IMPLANT
BLADE SURG SZ10 CARB STEEL (BLADE) IMPLANT
COVER MAYO STAND STRL (DRAPES) ×4 IMPLANT
COVER SURGICAL LIGHT HANDLE (MISCELLANEOUS) IMPLANT
DERMABOND ADVANCED .7 DNX12 (GAUZE/BANDAGES/DRESSINGS) ×4 IMPLANT
DEVICE SUTURE ENDOST 10MM (ENDOMECHANICALS) IMPLANT
DRAPE SHEET LG 3/4 BI-LAMINATE (DRAPES) IMPLANT
DURAPREP 26ML APPLICATOR (WOUND CARE) ×4 IMPLANT
GAUZE 4X4 16PLY ~~LOC~~+RFID DBL (SPONGE) ×4 IMPLANT
GLOVE BIO SURGEON STRL SZ 6.5 (GLOVE) ×4 IMPLANT
GLOVE BIOGEL PI IND STRL 6.5 (GLOVE) ×8 IMPLANT
GLOVE BIOGEL PI IND STRL 7.0 (GLOVE) ×8 IMPLANT
GLOVE ECLIPSE 6.5 STRL STRAW (GLOVE) ×4 IMPLANT
GOWN STRL REUS W/TWL LRG LVL3 (GOWN DISPOSABLE) ×12 IMPLANT
HIBICLENS CHG 4% 4OZ BTL (MISCELLANEOUS) ×4 IMPLANT
IV NS 1000ML (IV SOLUTION) ×6
IV NS 1000ML BAXH (IV SOLUTION) IMPLANT
KIT TURNOVER CYSTO (KITS) ×4 IMPLANT
L-HOOK LAP DISP 36CM (ELECTROSURGICAL) ×3
LEGGING LITHOTOMY PAIR STRL (DRAPES) IMPLANT
LHOOK LAP DISP 36CM (ELECTROSURGICAL) ×4 IMPLANT
LIGASURE VESSEL 5MM BLUNT TIP (ELECTROSURGICAL) ×4 IMPLANT
MANIPULATOR VCARE LG CRV RETR (MISCELLANEOUS) IMPLANT
MANIPULATOR VCARE SML CRV RETR (MISCELLANEOUS) IMPLANT
MANIPULATOR VCARE STD CRV RETR (MISCELLANEOUS) IMPLANT
NDL SPNL 22GX3.5 QUINCKE BK (NEEDLE) IMPLANT
NEEDLE SPNL 22GX3.5 QUINCKE BK (NEEDLE) ×3 IMPLANT
PACK LAPAROSCOPY BASIN (CUSTOM PROCEDURE TRAY) ×4 IMPLANT
PACK TRENDGUARD 450 HYBRID PRO (MISCELLANEOUS) ×4 IMPLANT
PENCIL BUTTON HOLSTER BLD 10FT (ELECTRODE) ×4 IMPLANT
PROTECTOR NERVE ULNAR (MISCELLANEOUS) ×8 IMPLANT
SCISSORS LAP 5X35 DISP (ENDOMECHANICALS) IMPLANT
SET IRRIG Y TYPE TUR BLADDER L (SET/KITS/TRAYS/PACK) ×4 IMPLANT
SET SUCTION IRRIG HYDROSURG (IRRIGATION / IRRIGATOR) ×4 IMPLANT
SET TRI-LUMEN FLTR TB AIRSEAL (TUBING) ×4 IMPLANT
SPONGE T-LAP 4X18 ~~LOC~~+RFID (SPONGE) IMPLANT
SUT VIC AB 0 CT1 18XCR BRD8 (SUTURE) IMPLANT
SUT VIC AB 0 CT1 8-18 (SUTURE) ×3
SUT VIC AB 2-0 CT1 (SUTURE) IMPLANT
SUT VIC AB 3-0 PS2 18 (SUTURE) ×3
SUT VIC AB 3-0 PS2 18XBRD (SUTURE) ×4 IMPLANT
SUT VIC AB 4-0 KS 27 (SUTURE) IMPLANT
SUT VICRYL 0 UR6 27IN ABS (SUTURE) ×4 IMPLANT
SUT VLOC 180 0 9IN  GS21 (SUTURE) ×3
SUT VLOC 180 0 9IN GS21 (SUTURE) ×4 IMPLANT
SYR 10ML LL (SYRINGE) IMPLANT
SYR BULB IRRIG 60ML STRL (SYRINGE) IMPLANT
SYR CONTROL 10ML LL (SYRINGE) IMPLANT
SYSTEM CARTER THOMASON II (TROCAR) ×4 IMPLANT
TOWEL OR 17X26 10 PK STRL BLUE (TOWEL DISPOSABLE) ×8 IMPLANT
TRAY FOLEY W/BAG SLVR 14FR LF (SET/KITS/TRAYS/PACK) ×4 IMPLANT
TRENDGUARD 450 HYBRID PRO PACK (MISCELLANEOUS) ×3
TROCAR PORT AIRSEAL 5X120 (TROCAR) ×4 IMPLANT
TROCAR Z-THREAD FIOS 11X100 BL (TROCAR) ×4 IMPLANT
TROCAR Z-THREAD FIOS 5X100MM (TROCAR) ×4 IMPLANT
TUBING CONNECTING 10 (TUBING) IMPLANT
WARMER LAPAROSCOPE (MISCELLANEOUS) ×4 IMPLANT
WATER STERILE IRR 500ML POUR (IV SOLUTION) IMPLANT
YANKAUER SUCT BULB TIP NO VENT (SUCTIONS) IMPLANT

## 2022-09-07 NOTE — Brief Op Note (Signed)
09/07/2022  10:32 AM  PATIENT:  [name]  51 y.o. female  PRE-OPERATIVE DIAGNOSIS:  abnormal uterine bleeding  POST-OPERATIVE DIAGNOSIS:  abnormal uterine bleeding  PROCEDURE:  Procedure(s): ABORTED TOTAL LAPAROSCOPIC HYSTERECTOMY / laparoscopic assisted vaginal hysterectomy (N/A) LAPAROSCOPIC BILATERAL SALPINGECTOMY (Bilateral) CYSTOSCOPY (N/A)  SURGEON:  Surgeon(s) and Role:    * Kerrilyn Azbill, Melida Quitter, MD - Primary    * Bovard-Stuckert, Jody, MD - Assisting  ANESTHESIA:   general  EBL:  400 mL   BLOOD ADMINISTERED:none  DRAINS: none   LOCAL MEDICATIONS USED:  MARCAINE    and Amount: 18 ml  SPECIMEN:  Source of Specimen:  cervix, uterus, bilateral fallopian tubes  DISPOSITION OF SPECIMEN:  PATHOLOGY  COUNTS:  YES  TOURNIQUET:  * No tourniquets in log *  PLAN OF CARE: Admit for overnight observation  PATIENT DISPOSITION:  PACU - hemodynamically stable.   Delay start of Pharmacological VTE agent (>24hrs) due to surgical blood loss or risk of bleeding: yes

## 2022-09-07 NOTE — H&P (Signed)
Andrea [name] is an 51 y.o. female prsenting for scheduled surgery. Minimal spotting since last office visit. Occ lower abdominal cramping only. Has been taking regular Linzess in anticipation of surgery per GI. Has been told to avoid NSAIDS  Pertinent Gynecological History: Menses:  irr VB Bleeding: intermenstrual bleeding Contraception: oral progesterone-only contraceptive DES exposure: denies Blood transfusions: none Sexually transmitted diseases: HSV1 Previous GYN Procedures:  none   Last mammogram: normal Date: 05/23/22 Last pap: normal Date: 03/28/21 OB History: G2, P2002   Menstrual History: Menarche age: early teens No LMP recorded. (Menstrual status: Oral contraceptives).    Past Medical History:  Diagnosis Date   Abnormal uterine bleeding    Pt follows with Eula Flax, MD. She is scheduled for a hysterectomy on 09/07/22.   Angiolipoma of kidney    around 8 years ago per pt on 08/29/22, benign   Anxiety    follows w/ PCP Dr. Dennard Schaumann, [place] 07/07/22   Arthritis    "hands" (09/02/2013), neck   Asthma    seasonal, follows with PCP, Dr. Dennard Schaumann   B12 deficiency    Cervical polyp 2023   Epicondylitis, lateral    Right   Fibromyalgia    sees PCP, Dr. Dennard Schaumann, [place] 07/07/22 in Epic   GERD (gastroesophageal reflux disease)    History of recurrent UTIs    Hx of multiple UTIs. As of 08/23/22, most recent UTI on 08/16/22 treated with Macrobid 100 mg bid x 7 days. Urine culture negative. See OV notes from Mila Merry, [place] in [place], dated 08/16/22 & 08/18/22.   IBS (irritable bowel syndrome)    .   Migraine headache    Pt takes Emgality monthly injections and follows with PCP, Dr. Jenna Luo @ [place].   Palpitations 06/2022   Hx of heart palpitations, PVCs. Pt had increased heart palpitations in 06/2022 during a urinary tract infection and fever. 06/13/22 EKG in Epic.    Past Surgical History:  Procedure Laterality Date   APPENDECTOMY  12/11/1982    CATARACT EXTRACTION Bilateral 2017   COLONOSCOPY  06/05/2022   One 7 mm sessile sigmoid polyp   HYSTEROSCOPY  11/2021   PALATAL EXPANSION  12/11/2002   REDUCTION MAMMAPLASTY Bilateral ~ 2009    Family History  Problem Relation Age of Onset   Coronary artery disease Father 27       MI   Heart attack Father    Diabetes Sister    Irregular heart beat Sister    Congestive Heart Failure Paternal Grandmother    Arrhythmia Paternal Uncle     Social History:  reports that she has never smoked. She has never used smokeless tobacco. She reports that she does not drink alcohol and does not use drugs.  Allergies:  Allergies  Allergen Reactions   Cefdinir Hives   Prednisone Rash     Only when takes in large doses   Macrolides And Ketolides     Headache,shortness of breath, heavy chest   Ceftin [Cefuroxime Axetil] Rash   Sulfa Antibiotics Rash    Facility-Administered Medications Prior to Admission  Medication Dose Route Frequency Provider Last Rate Last Admin   betamethasone acetate-betamethasone sodium phosphate (CELESTONE) injection 3 mg  3 mg Intra-articular Once Edrick Kins, DPM       Medications Prior to Admission  Medication Sig Dispense Refill Last Dose   acetaminophen (TYLENOL) 500 MG tablet Take 1,000 mg by mouth every 6 (six) hours as needed for moderate pain or headache.  Past Week   ALPRAZolam (XANAX) 0.5 MG tablet TAKE 1 TABLET BY MOUTH TWICE A DAY AS NEEDED FOR ANXIETY 30 tablet 0 09/06/2022   Black Pepper-Turmeric (TURMERIC CURCUMIN) 04-999 MG CAPS Take 3 tablets by mouth.   Past Week   celecoxib (CELEBREX) 200 MG capsule Take 200 mg by mouth daily as needed for mild pain. Rarely takes   Past Month   cyclobenzaprine (FLEXERIL) 10 MG tablet TAKE 1 TABLET BY MOUTH EVERY 8 HOURS AS NEEDED FOR MUSCLE SPASMS (Patient taking differently: at bedtime. TAKE 1 TABLET BY MOUTH EVERY 8 HOURS AS NEEDED FOR MUSCLE SPASMS) 60 tablet 1 09/06/2022   docusate sodium (COLACE) 100 MG  capsule Take 200 mg by mouth daily as needed for mild constipation.   Past Week   DULoxetine (CYMBALTA) 60 MG capsule Take 1 capsule (60 mg total) by mouth daily. (Patient taking differently: Take 60 mg by mouth at bedtime.) 90 capsule 0 09/06/2022   EMGALITY 120 MG/ML SOAJ INJECT 120 MG INTO THE SKIN EVERY 30 (THIRTY) DAYS. 1 mL 11 Past Month   LINZESS 290 MCG CAPS capsule TAKE 1 CAPSULE (290 MCG TOTAL) BY MOUTH DAILY BEFORE BREAKFAST. (Patient taking differently: as needed.) 30 capsule 3 Past Week   nitrofurantoin, macrocrystal-monohydrate, (MACROBID) 100 MG capsule Take 1 capsule (100 mg total) by mouth 2 (two) times daily. 14 capsule 0 Past Month   oxyCODONE-acetaminophen (PERCOCET) 10-325 MG tablet Take 1 tablet by mouth every 8 (eight) hours as needed for pain (for migraines). 30 tablet 0 09/06/2022   Polyethyl Glycol-Propyl Glycol (SYSTANE OP) Apply 1 drop to eye daily as needed (dry eyes).   Past Month   promethazine (PHENERGAN) 25 MG tablet TAKE 1 TABELT BY MOUTH EVERY 4 HOURS (Patient taking differently: every 4 (four) hours as needed. TAKE 1 TABELT BY MOUTH EVERY 4 HOURS) 20 tablet 1 Past Month   albuterol (VENTOLIN HFA) 108 (90 Base) MCG/ACT inhaler INHALE 2 PUFFS INTO THE LUNGS EVERY 6 HOURS AS NEEDED FOR WHEEZE OR SHORTNESS OF BREATH 8.5 each 2 More than a month   diclofenac (VOLTAREN) 75 MG EC tablet TAKE 1 TABLET BY MOUTH TWICE A DAY (Patient not taking: Reported on 08/29/2022) 60 tablet 4 More than a month   norethindrone (MICRONOR) 0.35 MG tablet Take 1 tablet by mouth at bedtime.   Unknown   phentermine (ADIPEX-P) 37.5 MG tablet Take 1 tablet (37.5 mg total) by mouth daily before breakfast. (Patient not taking: Reported on 08/29/2022) 30 tablet 2 More than a month   sodium chloride (OCEAN) 0.65 % SOLN nasal spray Place 1 spray into both nostrils as needed for congestion.   More than a month    Review of Systems  Constitutional:  Negative for chills and fever.  Respiratory:  Negative  for shortness of breath.   Cardiovascular:  Negative for chest pain, palpitations and leg swelling.  Gastrointestinal:  Negative for abdominal pain, nausea and vomiting.  Neurological:  Negative for dizziness, weakness and headaches.  Psychiatric/Behavioral:  Negative for suicidal ideas.     Blood pressure 133/86, pulse 92, temperature 98.5 F (36.9 C), temperature source Oral, resp. rate 17, height 5' (1.524 m), weight 86.7 kg, SpO2 99 %. Physical Exam Gen: NAD, AAOx3 CV: CTAB, RRR Abd: RLQ scar c/w prior appy, rest NTTP and soft GU deferred Results for orders placed or performed during the hospital encounter of 09/07/22 (from the past 24 hour(s))  Pregnancy, urine POC     Status: None   Collection Time:  09/07/22  5:37 AM  Result Value Ref Range   Preg Test, Ur NEGATIVE NEGATIVE  ABO/Rh     Status: None   Collection Time: 09/07/22  6:17 AM  Result Value Ref Range   ABO/RH(D)      O POS Performed at [facility], [place] [address], [place], [place] [address]     No results found. Uterus 7x4.5x3.8cm uterus, EMS 8m, striations c/w adeno, possible cervical polyps noted. BL ovaries WNL.  Assessment/Plan: This is a [id number] on POPs presenting for scheduled TLH/BS/cysto for AUB s/p both oral contraception and D&C/Myosure removal of polyp. Satisfied fertility, desires definitive mgmt in form of hyterectomy Risks of TLH include infection of the uterus, pelvic organs, or skin, inadvertent injury to internal organs, such as bowel or bladder. If there is major injury, extensive surgery may be required. If injury is minor, it may be treated with relative ease. Discussed possibility of excessive blood loss and transfusion. Patient aware that no future fertility will remain after procedure. Patient accepts the possibility of blood transfusion, if necessary. Bowel and/or bladder injury may require prolonged inpatient stay and possible colostomy, Foley catheter, etc, as deemed  fit by other surgeon. Patient understands and agrees to move forward with surgery.   SMelida QuitterShivaji 09/07/2022, 7:41 AM

## 2022-09-07 NOTE — Op Note (Signed)
09/07/22 Surgeon: Eula Flax, MD Assistant: Janyth Contes, MD Preoperative Diagnosis: Abnormal uterine bleeding Postoperative Diagnosis: same as above Procedures performed: Aborted total laparoscopic hysterectomy, converted to laparoscopic-assisted vaginal hysterectomy with bilateral salpingectomy; cystoscopy. Lysis of adhesions Anesthesia: General by Dr Ileene Patrick IVF: 2 UOP: 297 EBL: 989 Complications: Difficulty in safely creating bladder flap from laparoscopic approach therefore colpotomy and completion of procedure completed vaginally  Findings: On external exam, External genitalia WNL. Notable hemorrhoids externally. BME reveals an anteverted uterus measuring 7cm. Anteverted vs anteflexed uterus   Indications: S/p hysteroscopy D&C/Myosure for AUB-P (polyp) while on POP. Amenorrhea short lived, recurrent AUB with repeat polyps on TVUS. Desires definitive surgical mgmt of bleeding; also recommended by neuro to avoid progesterone if possible  Consent: Risks of hysterectomy include infection of the uterus, pelvic organs, or skin, inadvertent injury to internal organs, such as bowel or bladder. If there is major injury, extensive surgery may be required. If injury is minor, it may be treated with relative ease. Discussed possibility of excessive blood loss and transfusion. Patient aware that no future fertility will remain after procedure. Patient accepts the possibility of blood transfusion, if necessary. Bowel and/or bladder injury may require prolonged inpatient stay and possible colostomy, Foley catheter, etc, as deemed fit by other surgeon. Patient understands and agrees to move forward with surgery  Operative Procedure: The patient was taken to the operating room where a timeout was To confirm correct patient and correct procedure.  The patient was given preoperative prophylactic antibiotics as recommended by a cog.  General esthesia was established.  The patient was then  positioned on the operating table in the dorsal lithotomy position with the legs supported using stirrups.  All pressure points were padded and a bear hugger was placed to maintain control of core body temperature.  The patient was then prepped and draped in the normal sterile fashion  An operative speculum was placed into the vagina.  A tenaculum was placed on the anterior lip of the cervix.  Uterus sounded to 7cm.  A Foley catheter was inserted into the bladder and a Vcare uterine manipulator was placed through the cervix into the uterus.  Attention was then turned to the abdomen where a small vertical incision was made infraumbilical.  A 5 mm trocar and sleeve were inserted through the incision into the peritoneum using direct visualization.  Laparoscopic visualization confirmed intraperitoneal insertion of the port.  Pneumoperitoneum was then established using carbon dioxide.  One 11m and one 5 mm ports were placed in bilateral lower quadrants making sure to measure 2 cm superior to and 2 cm medially to each corresponding ASIS.  Attention was then turned to the left uterine fundus which was grasped at the level of the fallopian tube using an atraumatic grasper.  The left ureter was identified to be well out of the surgical field.  The Ligasure was then used to coagulate and cut through the mesosalpinx to the level of the cornua.  The round ligament and subsequently the broad ligament was then coagulated and cut to the level of the uterine arteries.  The anterior leaf of the broad ligament was dissected down to begin creation of a bladder flap at the level of the lower uterine segment. Difficulty noted in deflecting vesicouteirne fold inferiorly in order to displace bladder. This process was repeated beginning at the right uterine fundus and continued in order to try and complete bladder flap. Again, despite incisions meeting at midline, unable to sufficiently displace bladder inferiorly enough to  safely  transect B cardinal ligaments and create colpotomy without possibly injuring ureters or denuding cervicalstroma and increasing EBL.   At this point, decision made to complete procedure vaginally   Attention was then turned to the perineum.  The weighted speculum was placed into the vagina.  The uterine manipulator was removed.  The cervix was grasped using a check of some tenaculum.  20% vasopressin and 100 mL normal saline was then injected approximately 10 mL around the cervix at the site of the cervicovaginal junction.  A semilunar incision was made in the mucosa of the vaginal fornix just above the porta below the attachment of the bladder using Bovie cautery.  The remainder of the bladder was freed from the anterior surface of the uterus by subsequent sharp and blunt dissection after the plica vesicouterine.  After the loose areolar plane was entered, the fascial dissection was completed.  The cervix was then pulled forward and a semilunar incision was made and joined on each of the sides of the cervix and connected to the height of the posterior fornix.  Confirmation of intraperitoneal entry was noted during posterior entry with release of gas.  The anterior and posterior incisions were then joined on each side of the cervix.  Care was taken to avoid injury to the rectum.  After cutting the mucosa, the uterosacral ligaments were then identified and clamped using Heaney clamps.  They were then cut and transfixed using 0 Vicryl sutures.  Bilateral ligaments were tagged with hemostats.  The bases of the cardinal ligaments were then clamped and cut using Heaney clamps and similarly transfixed.  Similar clamped cut suture technique was used until bilateral cornua were reached.  The uterus was noted to be adequately mobile and delivered easily through the vagina.  Care was taken to note hemostasis from bilateral pedicles. The uterus, tubes and cervix were then passed off to pathology.  Using the 0 swedged-on  Vicryl suture, bilateral uterosacral ligaments were plicated and remaining tags were tied together for increased apical support. Oozing noted at posterior peritoneum near cuff which wa\s made hemostatic with figure-of-eight 0-vicryl stitch. The vaginal cuff was then closed vertically using 0 Vicryl suture in a running locked fashion.  Good hemostasis was confirmed.  Cystoscopy was then carried out.  Fluorescein was administered by anesthesia.  The Foley catheter was then removed and 30 degree cystoscope inserted into the urethra.  Upon insertion of scope, positive bowel sign.  No evidence of suture or gross injury or bleeding.  Bilateral E flux of yellow-green tinted urine was noted from both left and right ureteral orifices.  Total fluid instilled into bladder was 200 cc.  The Foley was then replaced at the end of cystoscopy.  Attention was then returned to the abdomen where, on inspection after reestablishing pneumoperitoneum, good hemostasis was confirmed from bilateral adnexa as well as cuff.  LLQ 66m port site was closed using carter-thompson closure device with 0-vicryl suture. RLQ 534mport was then removed.  The pneumoperitoneum was evacuated with several breaths being given by anesthesia.  The infraumbilical port was then removed and all 3 port sites were closed using 4-0 Vicryl in a running subcuticular stitch plus Dermabond.  The patient was transferred to the recovery room in stable condition.  All needle, sponge and instrument counts were noted to be correct x2 at the end of the procedure.   Additional time needed during procedure given [facility])

## 2022-09-07 NOTE — Transfer of Care (Signed)
Immediate Anesthesia Transfer of Care Note  Patient: Andrea [name]  Procedure(s) Performed: Procedure(s) (LRB): ABORTED TOTAL LAPAROSCOPIC HYSTERECTOMY / laparoscopic assisted vaginal hysterectomy (N/A) LAPAROSCOPIC BILATERAL SALPINGECTOMY (Bilateral) CYSTOSCOPY (N/A)  Patient Location: PACU  Anesthesia Type: General  Level of Consciousness: awake, oriented, sedated and patient cooperative  Airway & Oxygen Therapy: Patient Spontanous Breathing and Patient connected to face mask oxygen  Post-op Assessment: Report given to PACU RN and Post -op Vital signs reviewed and stable  Post vital signs: Reviewed and stable  Complications: No apparent anesthesia complications Last Vitals:  Vitals Value Taken Time  BP 139/87 09/07/22 1039  Temp    Pulse 94 09/07/22 1043  Resp 22 09/07/22 1043  SpO2 98 % 09/07/22 1043  Vitals shown include unvalidated device data.  Last Pain:  Vitals:   09/07/22 0555  TempSrc: Oral  PainSc: 0-No pain      Patients Stated Pain Goal: 8 (83/38/25 0539)  Complications: No notable events documented.

## 2022-09-07 NOTE — Anesthesia Postprocedure Evaluation (Signed)
Anesthesia Post Note  Patient: Andrea [name]  Procedure(s) Performed: ABORTED TOTAL LAPAROSCOPIC HYSTERECTOMY / laparoscopic assisted vaginal hysterectomy (Pelvis) LAPAROSCOPIC BILATERAL SALPINGECTOMY (Bilateral: Pelvis) CYSTOSCOPY (Bladder)     Patient location during evaluation: PACU Anesthesia Type: General Level of consciousness: awake and alert Pain management: pain level controlled Vital Signs Assessment: post-procedure vital signs reviewed and stable Respiratory status: spontaneous breathing, nonlabored ventilation, respiratory function stable and patient connected to nasal cannula oxygen Cardiovascular status: blood pressure returned to baseline and stable Postop Assessment: no apparent nausea or vomiting Anesthetic complications: no   No notable events documented.  Last Vitals:  Vitals:   09/07/22 1214 09/07/22 1330  BP: 128/76 104/67  Pulse: 90 97  Resp: 20 18  Temp: 36.5 C   SpO2: 97% 97%    Last Pain:  Vitals:   09/07/22 1214  TempSrc: Oral  PainSc:                  Effie Berkshire

## 2022-09-07 NOTE — Progress Notes (Signed)
Patient sitting up in bed, tolerating liquids without N/V. No significant apin currently, has heat pack. Foley still in place. Intraop course reviewed including conversion to LAVH. BP 128/76 (BP Location: Right Arm)   Pulse 90   Temp 97.7 F (36.5 C) (Oral)   Resp 20   Ht 5' (1.524 m)   Wt 86.7 kg   SpO2 97%   BMI 37.32 kg/m  Pla  to DC Foley at 1300, Dc IV fluids at this time Encourage ambulation, incentive spirometry. Pending flatus.

## 2022-09-07 NOTE — Anesthesia Procedure Notes (Signed)
Procedure Name: Intubation Date/Time: 09/07/2022 7:58 AM  Performed by: Suan Halter, CRNAPre-anesthesia Checklist: Patient identified, Emergency Drugs available, Suction available and Patient being monitored Patient Re-evaluated:Patient Re-evaluated prior to induction Oxygen Delivery Method: Circle system utilized Preoxygenation: Pre-oxygenation with 100% oxygen Induction Type: IV induction Ventilation: Mask ventilation without difficulty Laryngoscope Size: Mac and 3 Grade View: Grade I Tube type: Oral Tube size: 7.0 mm Number of attempts: 1 Airway Equipment and Method: Stylet and Oral airway Placement Confirmation: ETT inserted through vocal cords under direct vision, positive ETCO2 and breath sounds checked- equal and bilateral Secured at: 22 cm Tube secured with: Tape Dental Injury: Teeth and Oropharynx as per pre-operative assessment

## 2022-09-08 DIAGNOSIS — N939 Abnormal uterine and vaginal bleeding, unspecified: Secondary | ICD-10-CM | POA: Diagnosis not present

## 2022-09-08 LAB — SURGICAL PATHOLOGY

## 2022-09-08 MED ORDER — OXYCODONE HCL 5 MG PO TABS: 5.0000 mg | ORAL_TABLET | Freq: Four times a day (QID) | ORAL | 0 refills | Status: DC | PRN

## 2022-09-08 MED ORDER — ACETAMINOPHEN 500 MG PO TABS
ORAL_TABLET | ORAL | Status: AC
  Filled 2022-09-08: qty 2

## 2022-09-08 MED ORDER — OXYCODONE HCL 5 MG PO TABS
ORAL_TABLET | ORAL | Status: AC
  Filled 2022-09-08: qty 2

## 2022-09-08 NOTE — Progress Notes (Signed)
POD #1  Subjective:  No acute events overnight.  Pt denies problems with ambulating, voiding or po intake.  She denies nausea or vomiting.  Pain is well controlled.  She has had flatus. She has not had bowel movement. Denies VB  Objective: Blood pressure (!) 105/58, pulse 89, temperature 97.8 F (36.6 C), resp. rate 18, height 5' (1.524 m), weight 86.7 kg, SpO2 100 %.  Physical Exam:  General: alert, cooperative and no distress Lochia:normal flow Chest: CTAB Heart: RRR no m/r/g Abdomen: +BS, soft, non-tender. Lap sites x3 CDI. Minimal distention from insufflation Extremities: neg edema, neg calf TTP BL, neg Homans BL   Assessment/Plan:  ASSESSMENT: Andrea [name] is a 51 y.o. [id number] s/p LAVH/BS/cysto for AUB. PMHx s/f fibromyalgia, IBS, migraines, asthma, anxiety.   -Continue PO pain meds -Encourage ambulation and incentive spirometry DC home today with 2wk incision check and 6wk postop   LOS: 0 days

## 2022-09-08 NOTE — Discharge Summary (Signed)
Physician Discharge Summary  Patient ID: Andrea [name] MRN: [id number] DOB/AGE: 03/25/1971 51 y.o.  Admit date: 09/07/2022 Discharge date: 09/08/2022  Admission Diagnoses:  Discharge Diagnoses:  Principal Problem:   Abnormal uterine bleeding (AUB)   Discharged Condition: good  Hospital Course: Admitted for scheduled TLH/BS/cysto for AUB. Converted to LAVH from [employer], please see operative note for full details. By POD#1, ambulating, tolerating PO, voiding, pain controlled on PO meds. Discharged home in stable fashion with routine precautions   Discharge Exam: Blood pressure (!) 105/58, pulse 89, temperature 97.8 F (36.6 C), resp. rate 18, height 5' (1.524 m), weight 86.7 kg, SpO2 100 %. General: alert, cooperative and no distress Lochia:normal flow Chest: CTAB Heart: RRR no m/r/g Abdomen: +BS, soft, non-tender. Lap sites x3 CDI. Minimal distention from insufflation Extremities: neg edema, neg calf TTP BL, neg Homans BL  Disposition: Discharge disposition: 01-Home or Self Care       Discharge Instructions     Call MD for:  difficulty breathing, headache or visual disturbances   Complete by: As directed    Call MD for:  hives   Complete by: As directed    Call MD for:  persistant dizziness or light-headedness   Complete by: As directed    Call MD for:  persistant nausea and vomiting   Complete by: As directed    Call MD for:  redness, tenderness, or signs of infection (pain, swelling, redness, odor or green/yellow discharge around incision site)   Complete by: As directed    Call MD for:  severe uncontrolled pain   Complete by: As directed    Call MD for:  temperature >100.4   Complete by: As directed    Diet - low sodium heart healthy   Complete by: As directed    Increase activity slowly   Complete by: As directed    Lifting restrictions   Complete by: As directed    Nothing >15lbs for 6 weeks   Sexual Activity Restrictions   Complete by: As directed     None for 6 weeks      Allergies as of 09/08/2022       Reactions   Cefdinir Hives   Prednisone Rash    Only when takes in large doses   Macrolides And Ketolides    Headache,shortness of breath, heavy chest   Ceftin [cefuroxime Axetil] Rash   Sulfa Antibiotics Rash        Medication List     STOP taking these medications    oxyCODONE-acetaminophen 10-325 MG tablet Commonly known as: PERCOCET       TAKE these medications    acetaminophen 500 MG tablet Commonly known as: TYLENOL Take 1,000 mg by mouth every 6 (six) hours as needed for moderate pain or headache.   albuterol 108 (90 Base) MCG/ACT inhaler Commonly known as: VENTOLIN HFA INHALE 2 PUFFS INTO THE LUNGS EVERY 6 HOURS AS NEEDED FOR WHEEZE OR SHORTNESS OF BREATH   ALPRAZolam 0.5 MG tablet Commonly known as: XANAX TAKE 1 TABLET BY MOUTH TWICE A DAY AS NEEDED FOR ANXIETY   celecoxib 200 MG capsule Commonly known as: CELEBREX Take 200 mg by mouth daily as needed for mild pain. Rarely takes   cyclobenzaprine 10 MG tablet Commonly known as: FLEXERIL TAKE 1 TABLET BY MOUTH EVERY 8 HOURS AS NEEDED FOR MUSCLE SPASMS What changed: when to take this   diclofenac 75 MG EC tablet Commonly known as: VOLTAREN TAKE 1 TABLET BY MOUTH TWICE A DAY  docusate sodium 100 MG capsule Commonly known as: COLACE Take 200 mg by mouth daily as needed for mild constipation.   DULoxetine 60 MG capsule Commonly known as: CYMBALTA Take 1 capsule (60 mg total) by mouth daily. What changed: when to take this   Emgality 120 MG/ML Soaj Generic drug: Galcanezumab-gnlm INJECT 120 MG INTO THE SKIN EVERY 30 (THIRTY) DAYS.   Linzess 290 MCG Caps capsule Generic drug: linaclotide TAKE 1 CAPSULE (290 MCG TOTAL) BY MOUTH DAILY BEFORE BREAKFAST. What changed: See the new instructions.   nitrofurantoin (macrocrystal-monohydrate) 100 MG capsule Commonly known as: Macrobid Take 1 capsule (100 mg total) by mouth 2 (two) times daily.    oxyCODONE 5 MG immediate release tablet Commonly known as: Roxicodone Take 1 tablet (5 mg total) by mouth every 6 (six) hours as needed for severe pain.   phentermine 37.5 MG tablet Commonly known as: Adipex-P Take 1 tablet (37.5 mg total) by mouth daily before breakfast.   promethazine 25 MG tablet Commonly known as: PHENERGAN TAKE 1 TABELT BY MOUTH EVERY 4 HOURS What changed: See the new instructions.   sodium chloride 0.65 % Soln nasal spray Commonly known as: OCEAN Place 1 spray into both nostrils as needed for congestion.   SYSTANE OP Apply 1 drop to eye daily as needed (dry eyes).   Turmeric Curcumin 04-999 MG Caps Take 3 tablets by mouth.        Follow-up Information     Alois Colgan, Melida Quitter, MD. Go in 2 week(s).   Specialty: Obstetrics and Gynecology Why: postop visit Contact information: [address] [place] [address] [phone]                 Signed: [place] 09/08/2022, 8:28 AM

## 2022-09-11 ENCOUNTER — Encounter (HOSPITAL_BASED_OUTPATIENT_CLINIC_OR_DEPARTMENT_OTHER): Payer: Self-pay | Admitting: Obstetrics and Gynecology

## 2022-09-13 ENCOUNTER — Encounter: Payer: Self-pay | Admitting: Family Medicine

## 2022-09-26 ENCOUNTER — Other Ambulatory Visit: Payer: Self-pay | Admitting: Family Medicine

## 2022-09-26 NOTE — Telephone Encounter (Signed)
Requested medication (s) are due for refill today: DUe 10/01/22  Requested medication (s) are on the active medication list: yes    Last refill: 09/01/22  #30  0 refills  Future visit scheduled No  Notes to clinic: Not delegated, please review. Last OV 07/07/22   Requested Prescriptions  Pending Prescriptions Disp Refills   ALPRAZolam (XANAX) 0.5 MG tablet [Pharmacy Med Name: ALPRAZOLAM 0.5 MG TABLET] 30 tablet 0    Sig: TAKE 1 TABLET BY MOUTH TWICE A DAY AS NEEDED FOR ANXIETY     Not Delegated - Psychiatry: Anxiolytics/Hypnotics 2 Failed - 09/26/2022  4:22 PM      Failed - This refill cannot be delegated      Failed - Urine Drug Screen completed in last 360 days      Failed - Valid encounter within last 6 months    Recent Outpatient Visits           9 months ago Fatigue, unspecified type   [place] Susy Frizzle, MD   1 year ago Palpitations   [place] Eulogio Bear, NP   1 year ago [place] Eulogio Bear, NP   1 year ago Neck pain   [place] Dennard Schaumann, Cammie Mcgee, MD   1 year ago [place], Cammie Mcgee, MD              Passed - Patient is not pregnant

## 2022-09-28 ENCOUNTER — Encounter: Payer: Self-pay | Admitting: Family Medicine

## 2022-10-08 ENCOUNTER — Other Ambulatory Visit: Payer: Self-pay | Admitting: Family Medicine

## 2022-10-09 NOTE — Telephone Encounter (Signed)
Requested medications are due for refill today.  Provider to determine  Requested medications are on the active medications list.  yes  Last refill. 06/29/2022 #60 1 rf  Future visit scheduled.   no  Notes to clinic.  Refill not delegated.    Requested Prescriptions  Pending Prescriptions Disp Refills   cyclobenzaprine (FLEXERIL) 10 MG tablet [Pharmacy Med Name: CYCLOBENZAPRINE 10 MG TABLET] 60 tablet 1    Sig: TAKE 1 TABLET BY MOUTH EVERY 8 HOURS AS NEEDED FOR MUSCLE SPASM     Not Delegated - Analgesics:  Muscle Relaxants Failed - 10/08/2022  6:37 AM      Failed - This refill cannot be delegated      Failed - Valid encounter within last 6 months    Recent Outpatient Visits           9 months ago Fatigue, unspecified type   [place] Susy Frizzle, MD   1 year ago Palpitations   [place] Eulogio Bear, NP   1 year ago [place] Eulogio Bear, NP   1 year ago Neck pain   [place] Dennard Schaumann, Cammie Mcgee, MD   1 year ago [place] Pickard, Cammie Mcgee, MD

## 2022-10-12 DIAGNOSIS — M654 Radial styloid tenosynovitis [de Quervain]: Secondary | ICD-10-CM | POA: Insufficient documentation

## 2022-10-15 ENCOUNTER — Inpatient Hospital Stay
Admit: 2022-10-15 | Discharge: 2022-10-15 | Disposition: A | Discharge status: Home / Self Care | Payer: PRIVATE HEALTH INSURANCE | Attending: Emergency Medicine

## 2022-10-15 DIAGNOSIS — M5441 Lumbago with sciatica, right side: Secondary | ICD-10-CM

## 2022-10-15 DIAGNOSIS — J040 Acute laryngitis: Secondary | ICD-10-CM

## 2022-10-15 LAB — RAPID STREP SCREEN: Strep A Ag: NEGATIVE

## 2022-10-15 MED ORDER — METHOCARBAMOL 750 MG PO TABS
750 MG | ORAL_TABLET | Freq: Four times a day (QID) | ORAL | 0 refills | Status: AC | PRN
Start: 2022-10-15 — End: 2022-10-20

## 2022-10-15 MED ORDER — LIDOCAINE 4 % EX PTCH
4 % | Freq: Every day | CUTANEOUS | 0 refills | Status: AC
Start: 2022-10-15 — End: 2022-10-25

## 2022-10-15 MED ORDER — KETOROLAC TROMETHAMINE 10 MG PO TABS
10 MG | ORAL_TABLET | Freq: Four times a day (QID) | ORAL | 0 refills | Status: AC | PRN
Start: 2022-10-15 — End: ?

## 2022-10-15 MED ORDER — DEXAMETHASONE SOD PHOSPHATE PF 10 MG/ML IJ SOLN
10 MG/ML | INTRAMUSCULAR | Status: AC
Start: 2022-10-15 — End: 2022-10-15
  Administered 2022-10-15: 22:00:00 10 mg via ORAL

## 2022-10-15 MED FILL — DEXAMETHASONE SOD PHOSPHATE PF 10 MG/ML IJ SOLN: 10 MG/ML | INTRAMUSCULAR | Qty: 1

## 2022-10-15 NOTE — ED Triage Notes (Signed)
Pt started with right hip pain and laryngitis Friday - possibly sciatica.  It is worse today  No known injury

## 2022-10-15 NOTE — Discharge Instructions (Signed)
Thank you!  Thank you for allowing me to care for you in the emergency department. It is my goal to provide you with excellent care. If you have not received excellent quality care, please ask to speak to the nurse manager. Please fill out the survey that will come to you by mail or email since we listen to your feedback!     Below you will find a list of your tests from today's visit.  Should you have any questions, please do not hesitate to call the emergency department.    Labs  Recent Results (from the past 12 hour(s))   Rapid Strep Screen    Collection Time: 10/15/22  3:45 PM    Specimen: Blood Serum; Throat   Result Value Ref Range    Strep A Ag Negative Negative         Radiologic Studies  No orders to display     ------------------------------------------------------------------------------------------------------------  The exam and treatment you received in the Emergency Department were for an urgent problem and are not intended as complete care. It is important that you follow-up with a doctor, nurse practitioner, or physician assistant to:  (1) confirm your diagnosis,  (2) re-evaluation of changes in your illness and treatment, and  (3) for ongoing care. Please take your discharge instructions with you when you go to your follow-up appointment.     If you have any problem arranging a follow-up appointment, contact the Emergency Department.  If your symptoms become worse or you do not improve as expected and you are unable to reach your health care provider, please return to the Emergency Department. We are available 24 hours a day.     If a prescription has been provided, please have it filled as soon as possible to prevent a delay in treatment. If you have any questions or reservations about taking the medication due to side effects or interactions with other medications, please call your primary care provider or contact the ER.

## 2022-10-15 NOTE — ED Provider Notes (Signed)
[facility] EMERGENCY DEPT  EMERGENCY DEPARTMENT HISTORY AND PHYSICAL EXAM      Date: 10/15/2022  Patient Name: Leah Ruiz  MRN: [id number]  Birthdate 1971-01-03  Date of evaluation: 10/15/2022  Provider: Cecil Cranker, MD   Note Started: 4:05 PM EST 10/15/22    HISTORY OF PRESENT ILLNESS     Chief Complaint   Patient presents with    Hip Pain             Laryngitis                              History Provided By: Patient    HPI: Leah Ruiz is a 51 y.o. female presents to the emergency department complaint of 3 days history of right lateral low back pain that radiates down the right leg.  Patient denies saddle anesthesia, denies bowel or bladder retention or incontinence, denies fevers or chills, denies trauma.  Patient also reports losing her voice during the last 3 days as well.  Patient denies any pain complaint at present, denies fevers or chills, denies any difficulty eating or drinking.    PAST MEDICAL HISTORY   Past Medical History:  Past Medical History:   Diagnosis Date    Hypertension        Past Surgical History:  Past Surgical History:   Procedure Laterality Date    TUBAL LIGATION         Family History:  History reviewed. No pertinent family history.    Social History:  Social History     Tobacco Use    Smoking status: Never    Smokeless tobacco: Never   Substance Use Topics    Alcohol use: Yes     Comment: occasionally    Drug use: Never       Allergies:  No Known Allergies    PCP: Mohiuddin, Abdul, MD    Current Meds:   Current Facility-Administered Medications   Medication Dose Route Frequency Provider Last Rate Last Admin    dexamethasone (DECADRON) Oral 10 mg  10 mg Oral NOW Cecil Cranker, MD         Current Outpatient Medications   Medication Sig Dispense Refill    ketorolac (TORADOL) 10 MG tablet Take 1 tablet by mouth every 6 hours as needed for Pain 20 tablet 0    methocarbamol (ROBAXIN-750) 750 MG tablet Take 1 tablet by mouth every 6 hours as needed (Muscle spasm) 20  tablet 0    lidocaine 4 % external patch Place 1 patch onto the skin daily for 10 days 10 each 0    amLODIPine (NORVASC) 5 MG tablet       losartan-hydroCHLOROthiazide (HYZAAR) 100-12.5 MG per tablet          Social Determinants of Health:   Social Determinants of Health     Tobacco Use: Low Risk  (10/15/2022)    Patient History     Smoking Tobacco Use: Never     Smokeless Tobacco Use: Never     Passive Exposure: Not on file   Alcohol Use: Not At Risk (10/15/2022)    AUDIT-C     Frequency of Alcohol Consumption: Never     Average Number of Drinks: Patient does not drink     Frequency of Binge Drinking: Never   Financial Resource Strain: Not on file   Food Insecurity: Not on file   Transportation Needs: Not on file  Physical Activity: Not on file   Stress: Not on file   Social Connections: Not on file   Intimate Partner Violence: Not on file   Depression: Not on file   Housing Stability: Not on file   Interpersonal Safety: Not on file   Utilities: Not on file       PHYSICAL EXAM   Physical Exam  Physical Exam  Constitutional:       General: No acute distress.     Appearance: Normal appearance.  Not toxic-appearing.   HENT:      Head: Normocephalic and atraumatic.      Nose: Nose normal.      Mouth/Throat:      Mouth: Mucous membranes are moist.  Minimal posterior oropharyngeal erythema without exudate.  Eyes:      Extraocular Movements: Extraocular movements intact.      Pupils: Pupils are equal, round, and reactive to light.   Cardiovascular:      Rate and Rhythm: Normal rate.      Pulses: Normal pulses.   Pulmonary:      Effort: Pulmonary effort is normal.      Breath sounds: No stridor.   Abdominal:      General: Abdomen is flat. There is no distension.   Musculoskeletal:         General: Normal range of motion.      Cervical back: Normal range of motion and neck supple.   Lumbar back: No midline tenderness, step-off or deformity.  Right lateral muscular tenderness/spasm noted.  Skin:     General: Skin is warm and  dry.      Capillary Refill: Capillary refill takes less than 2 seconds.   Neurological:      General: No focal deficit present.      Mental Status: Aert and oriented to person, place, and time.   Psychiatric:         Mood and Affect: Mood normal.         Behavior: Behavior normal.       SCREENINGS                   LAB, EKG AND DIAGNOSTIC RESULTS   Labs:  Recent Results (from the past 12 hour(s))   Rapid Strep Screen    Collection Time: 10/15/22  3:45 PM    Specimen: Blood Serum; Throat   Result Value Ref Range    Strep A Ag Negative Negative         EKG:.Not Applicable    Radiologic Studies:  Non-plain film images such as CT, Ultrasound and MRI are read by the radiologist. Plain radiographic images are visualized and preliminarily interpreted by the ED Provider with the following findings: Not Applicable.    Interpretation per the Radiologist below, if available at the time of this note:  No orders to display        EMERGENCY DEPARTMENT COURSE and DIFFERENTIAL DIAGNOSIS/MDM   CC/HPI Summary, DDx, ED Course, and Reassessment: We will evaluate for strep, will treat as low back pain with sciatica without any symptoms concerning for cauda equina.    Clinical Management Tools:  Not Applicable    Records Reviewed (source and summary of external notes): Prior medical records and Nursing notes    Vitals:    Vitals:    10/15/22 1530   BP: (!) 144/84   Pulse: 74   Resp: 18   Temp: 98.4 F (36.9 C)   TempSrc: Oral   SpO2: 100%  Weight: (!) 145.2 kg (320 lb)   Height: 1.829 m (6')        ED COURSE       Disposition Considerations (Tests not done, Shared Decision Making, Pt Expectation of Test or Treatment.): See MDM    Patient was given the following medications:  Medications   dexamethasone (DECADRON) Oral 10 mg (has no administration in time range)       CONSULTS: (Who and What was discussed)  None     Social Determinants affecting Dx or Tx: None    Smoking Cessation: Not Applicable    PROCEDURES   Unless otherwise noted  above, none  Procedures      CRITICAL CARE TIME   Patient does not meet Critical Care Time, 0 minutes    ED FINAL IMPRESSION     1. Acute right-sided low back pain with right-sided sciatica    2. Laryngitis, acute          DISPOSITION/PLAN   DISPOSITION Decision To Discharge 10/15/2022 04:32:22 PM    Discharge Note: The patient is stable for discharge home. The signs, symptoms, diagnosis, and discharge instructions have been discussed, understanding conveyed, and agreed upon. The patient is to follow up as recommended or return to ER should their symptoms worsen.      PATIENT REFERRED TO:  Glynn Octave, MD  [place] [address]  [phone]      As needed        DISCHARGE MEDICATIONS:     Medication List        START taking these medications      ketorolac 10 MG tablet  Commonly known as: TORADOL  Take 1 tablet by mouth every 6 hours as needed for Pain     lidocaine 4 % external patch  Place 1 patch onto the skin daily for 10 days     methocarbamol 750 MG tablet  Commonly known as: Robaxin-750  Take 1 tablet by mouth every 6 hours as needed (Muscle spasm)            ASK your doctor about these medications      amLODIPine 5 MG tablet  Commonly known as: NORVASC     losartan-hydroCHLOROthiazide 100-12.5 MG per tablet  Commonly known as: HYZAAR               Where to Get Your Medications        These medications were sent to [place] [place] [phone]  [place], [place] [address]      Phone: [phone]   ketorolac 10 MG tablet  lidocaine 4 % external patch  methocarbamol 750 MG tablet           DISCONTINUED MEDICATIONS:  Current Discharge Medication List          I am the Primary Clinician of Record. Amada Jupiter, MD (electronically signed)    (Please note that parts of this dictation were completed with voice recognition software. Quite often unanticipated grammatical, syntax, homophones,  and other interpretive errors are inadvertently transcribed by the computer software. Please disregards these errors. Please excuse any errors that have escaped final proofreading.)     Amada Jupiter, MD  10/15/22 [phone]

## 2022-10-17 LAB — CULTURE, THROAT: Culture: NORMAL

## 2022-10-21 ENCOUNTER — Other Ambulatory Visit: Payer: Self-pay | Admitting: Family Medicine

## 2022-10-23 NOTE — Telephone Encounter (Signed)
Requested Prescriptions  Pending Prescriptions Disp Refills   DULoxetine (CYMBALTA) 60 MG capsule [Pharmacy Med Name: DULOXETINE HCL DR 60 MG CAP] 90 capsule 1    Sig: TAKE 1 CAPSULE BY MOUTH EVERY DAY     Psychiatry: Antidepressants - SNRI - duloxetine Failed - 10/21/2022  9:00 AM      Failed - Valid encounter within last 6 months    Recent Outpatient Visits           10 months ago Fatigue, unspecified type   [place] Pickard, Cammie Mcgee, MD   1 year ago [place], Jessica A, NP   1 year ago [place] Eulogio Bear, NP   1 year ago Neck pain   [facility] Susy Frizzle, MD   1 year ago [place] Dennard Schaumann, Cammie Mcgee, MD              Passed - Cr in normal range and within 360 days    Creat  Date Value Ref Range Status  06/08/2022 0.80 0.50 - 1.03 mg/dL Final   Creatinine, Ser  Date Value Ref Range Status  07/18/2022 0.87 0.44 - 1.00 mg/dL Final         Passed - eGFR is 30 or above and within 360 days    GFR, Est African American  Date Value Ref Range Status  07/08/2019 101 > OR = 60 mL/min/1.42m Final   GFR, Est Non African American  Date Value Ref Range Status  07/08/2019 87 > OR = 60 mL/min/1.77mFinal   GFR, Estimated  Date Value Ref Range Status  07/18/2022 >60 >60 mL/min Final    Comment:    (NOTE) Calculated using the CKD-EPI Creatinine Equation (2021)    eGFR  Date Value Ref Range Status  06/08/2022 89 > OR = 60 mL/min/1.7375minal    Comment:    The eGFR is based on the CKD-EPI 2021 equation. To calculate  the new eGFR from a previous Creatinine or Cystatin C result, go to https://www.kidney.org/professionals/ kdoqi/gfr%5Fcalculator          Passed - Completed PHQ-2 or PHQ-9 in the last 360 days      Passed - Last BP in normal range    BP Readings from Last 1 Encounters:  09/08/22 109/63

## 2022-10-25 ENCOUNTER — Other Ambulatory Visit: Payer: Self-pay | Admitting: Family Medicine

## 2022-10-25 NOTE — Telephone Encounter (Signed)
Requested Prescriptions  Pending Prescriptions Disp Refills   LINZESS 290 MCG CAPS capsule [Pharmacy Med Name: LINZESS 290 MCG CAPSULE] 30 capsule 1    Sig: TAKE 1 CAPSULE BY MOUTH DAILY BEFORE BREAKFAST.     Gastroenterology: Irritable Bowel Syndrome Passed - 10/25/2022 12:12 PM      Passed - Valid encounter within last 12 months    Recent Outpatient Visits           10 months ago Fatigue, unspecified type   [place] Pickard, Cammie Mcgee, MD   1 year ago [place], Jessica A, NP   1 year ago [place] Eulogio Bear, NP   1 year ago Neck pain   [place] Dennard Schaumann, Cammie Mcgee, MD   1 year ago [place] Pickard, Cammie Mcgee, MD

## 2022-10-25 NOTE — Telephone Encounter (Signed)
Requested medication (s) are due for refill today: no  Requested medication (s) are on the active medication list: yes    Last refill:        03/03/22      26m      11 refills  Future visit scheduled no  Notes to clinic:Off protocol, please review. Thank you. Pharmacy comment: "Product backordered/unavailable: PEN on backorder please advise.  Requested Prescriptions  Pending Prescriptions Disp Refills   EMGALITY 120 MG/ML SOAJ [Pharmacy Med Name: EMGALITY 120 MG/ML PEN]  11    Sig: INJECT 120 MG INTO THE SKIN EVERY 30 (THIRTY) DAYS.     Off-Protocol Failed - 10/25/2022  1:29 PM      Failed - Medication not assigned to a protocol, review manually.      Passed - Valid encounter within last 12 months    Recent Outpatient Visits           10 months ago Fatigue, unspecified type   [place]   [place]   [place]   1 year ago Neck pain   [place]ge[place]   1 [place] Mcgee [place]

## 2022-10-26 ENCOUNTER — Other Ambulatory Visit: Payer: Self-pay | Admitting: Family Medicine

## 2022-10-26 NOTE — Telephone Encounter (Signed)
Requested medication (s) are due for refill today: yes  Requested medication (s) are on the active medication list: yes  Last refill:  10/26/22 #1 ml 11 refills  Future visit scheduled: no  Notes to clinic:  medication not assigned to a protocol. Pharmacy comment: Product Backordered/Unavailable:PEN NO LONGER AVAILABLE PLEASE RESEND AS SYRINGE.        Please advise       Requested Prescriptions  Pending Prescriptions Disp Refills   EMGALITY 120 MG/ML SOAJ [Pharmacy Med Name: EMGALITY 120 MG/ML PEN]  11    Sig: INJECT 120 MG INTO THE SKIN EVERY 30 (THIRTY) DAYS.     Off-Protocol Failed - 10/26/2022  1:41 PM      Failed - Medication not assigned to a protocol, review manually.      Passed - Valid encounter within last 12 months    Recent Outpatient Visits           10 months ago Fatigue, unspecified type   [place] Pickard, Cammie Mcgee, MD   1 year ago [place], Jessica A, NP   1 year ago [place] Eulogio Bear, NP   1 year ago Neck pain   [place] Dennard Schaumann, Cammie Mcgee, MD   1 year ago [place] Pickard, Cammie Mcgee, MD

## 2022-10-31 ENCOUNTER — Other Ambulatory Visit: Payer: Self-pay

## 2022-10-31 DIAGNOSIS — G43829 Menstrual migraine, not intractable, without status migrainosus: Secondary | ICD-10-CM

## 2022-10-31 MED ORDER — EMGALITY 120 MG/ML ~~LOC~~ SOAJ: 120.0000 mg | SUBCUTANEOUS | 11 refills | Status: DC

## 2022-11-06 ENCOUNTER — Encounter: Payer: Self-pay | Admitting: Family Medicine

## 2022-11-06 ENCOUNTER — Telehealth: Payer: Self-pay

## 2022-11-06 ENCOUNTER — Other Ambulatory Visit: Payer: Self-pay | Admitting: Family Medicine

## 2022-11-06 NOTE — Telephone Encounter (Signed)
MY CHART MESSAGE FROM PATIENT: ----- Message -----      From:Andrea [name]      Sent:11/06/2022  4:38 PM EST        LT:[id number] Avel Peace, MD   Subject:Emgality  Good afternoon Dr. Dennard Schaumann. I have not had my Emgality for weeks now. I just kept getting messages from [facility] about it not being in stock/ back ordered. I just just called [facility] and the lady said they no longer make injectable medicine anymore. She said that your office was waiting approval from my insurance company to use syringes. Is there not another injectable migraine medicine that  can be used?   I have had so many migraines the past few weeks and  I'm feeling a bit desperate for some relief .   Thanks, Andrea [name] I guess syringes give yourself shot I can do it ain't shit to you  MY REPLY: Hi Andrea [name],   I am sorry you are going through all of this. I sent in a prescription ok to change the medication to the syringes last week. I will send your message to Dr. Dennard Schaumann to see if there is anything else that can be used. Thank you.  Karle Starch

## 2022-11-07 ENCOUNTER — Telehealth: Payer: Self-pay | Admitting: Family Medicine

## 2022-11-07 NOTE — Telephone Encounter (Signed)
  Prescription Request  11/07/2022  Is this a "Controlled Substance" medicine? No  LOV: 11/06/2022   What is the name of the medication or equipment?   Semaglutide-Weight Management (WEGOVY) 0.25 MG/0.5ML Darden Palmer [[id number]]  ENDED   Have you contacted your pharmacy to request a refill? Yes   Which pharmacy would you like this sent to?  [id number]: [phone]: [phone]  Patient notified that their request is being sent to the clinical staff for review and that they should receive a response within 2 business days.   Please advise pharmacist at [phone]

## 2022-11-08 ENCOUNTER — Other Ambulatory Visit: Payer: Self-pay

## 2022-11-08 DIAGNOSIS — G43829 Menstrual migraine, not intractable, without status migrainosus: Secondary | ICD-10-CM

## 2022-11-08 MED ORDER — AIMOVIG 140 MG/ML ~~LOC~~ SOAJ: 140.0000 mg | SUBCUTANEOUS | 3 refills | Status: DC

## 2022-11-14 ENCOUNTER — Encounter: Payer: Self-pay | Admitting: Family Medicine

## 2022-11-15 ENCOUNTER — Telehealth: Payer: Self-pay

## 2022-11-15 ENCOUNTER — Other Ambulatory Visit: Payer: Self-pay

## 2022-11-15 NOTE — Telephone Encounter (Signed)
MY CHART MESSAGE FROM PATIENT:  Hey Dr. Dennard Schaumann. I am requesting a refill on my Oxycodone. Due to not having my Emgality or the replacement yet, I have had more migraines.  Thanks, Andrea [name]   LAST RF WAS 09/08/2022 but was filled by Dr. Eula Flax.  I have also asked patient why the pharmacy has not filled her Emgality yet. Thank you.

## 2022-11-16 ENCOUNTER — Other Ambulatory Visit: Payer: Self-pay | Admitting: Family Medicine

## 2022-11-16 MED ORDER — OXYCODONE HCL 5 MG PO TABS: 5.0000 mg | ORAL_TABLET | Freq: Four times a day (QID) | ORAL | 0 refills | Status: DC | PRN

## 2022-12-05 ENCOUNTER — Encounter: Payer: Self-pay | Admitting: Family Medicine

## 2022-12-07 ENCOUNTER — Telehealth: Payer: BC Managed Care – PPO | Admitting: Physician Assistant

## 2022-12-07 ENCOUNTER — Other Ambulatory Visit: Payer: Self-pay | Admitting: Family Medicine

## 2022-12-07 DIAGNOSIS — R3989 Other symptoms and signs involving the genitourinary system: Secondary | ICD-10-CM | POA: Diagnosis not present

## 2022-12-07 MED ORDER — NITROFURANTOIN MONOHYD MACRO 100 MG PO CAPS: 100.0000 mg | ORAL_CAPSULE | Freq: Two times a day (BID) | ORAL | 0 refills | Status: DC

## 2022-12-07 MED ORDER — OXYCODONE-ACETAMINOPHEN 5-325 MG PO TABS: 1.0000 | ORAL_TABLET | ORAL | 0 refills | Status: AC | PRN

## 2022-12-07 NOTE — Progress Notes (Signed)
E-Visit for Urinary Problems  We are sorry that you are not feeling well.  Here is how we plan to help!  Based on what you shared with me it looks like you most likely have a simple urinary tract infection.  A UTI (Urinary Tract Infection) is a bacterial infection of the bladder.  Most cases of urinary tract infections are simple to treat but a key part of your care is to encourage you to drink plenty of fluids and watch your symptoms carefully.  I have prescribed MacroBid 100 mg twice a day for 5 days.  Your symptoms should gradually improve. Call us if the burning in your urine worsens, you develop worsening fever, back pain or pelvic pain or if your symptoms do not resolve after completing the antibiotic.  Urinary tract infections can be prevented by drinking plenty of water to keep your body hydrated.  Also be sure when you wipe, wipe from front to back and don't hold it in!  If possible, empty your bladder every 4 hours.  HOME CARE Drink plenty of fluids Compete the full course of the antibiotics even if the symptoms resolve Remember, when you need to go.go. Holding in your urine can increase the likelihood of getting a UTI! GET HELP RIGHT AWAY IF: You cannot urinate You get a high fever Worsening back pain occurs You see blood in your urine You feel sick to your stomach or throw up You feel like you are going to pass out  MAKE SURE YOU  Understand these instructions. Will watch your condition. Will get help right away if you are not doing well or get worse.   Thank you for choosing an e-visit.  Your e-visit answers were reviewed by a board certified advanced clinical practitioner to complete your personal care plan. Depending upon the condition, your plan could have included both over the counter or prescription medications.  Please review your pharmacy choice. Make sure the pharmacy is open so you can pick up prescription now. If there is a problem, you may contact your  provider through MyChart messaging and have the prescription routed to another pharmacy.  Your safety is important to us. If you have drug allergies check your prescription carefully.   For the next 24 hours you can use MyChart to ask questions about today's visit, request a non-urgent call back, or ask for a work or school excuse. You will get an email in the next two days asking about your experience. I hope that your e-visit has been valuable and will speed your recovery.  I have spent 5 minutes in review of e-visit questionnaire, review and updating patient chart, medical decision making and response to patient.   Satoshi Kalas M Massiah Minjares, PA-C  

## 2022-12-10 ENCOUNTER — Other Ambulatory Visit: Payer: Self-pay | Admitting: Family Medicine

## 2022-12-12 NOTE — Telephone Encounter (Signed)
Requested medication (s) are due for refill today: Yes  Requested medication (s) are on the active medication list: Yes  Last refill:  11/07/22  Future visit scheduled: No  Notes to clinic:  See request.    Requested Prescriptions  Pending Prescriptions Disp Refills   ALPRAZolam (XANAX) 0.5 MG tablet [Pharmacy Med Name: ALPRAZOLAM 0.5 MG TABLET] 30 tablet 0    Sig: TAKE 1 TABLET BY MOUTH TWICE A DAY AS NEEDED FOR ANXIETY     Not Delegated - Psychiatry: Anxiolytics/Hypnotics 2 Failed - 12/10/2022 10:00 AM      Failed - This refill cannot be delegated      Failed - Urine Drug Screen completed in last 360 days      Failed - Valid encounter within last 6 months    Recent Outpatient Visits           11 months ago Fatigue, unspecified type   [place] Susy Frizzle, MD   1 year ago Palpitations   [place] Eulogio Bear, NP   1 year ago [place] Eulogio Bear, NP   1 year ago Neck pain   [place] Dennard Schaumann, Cammie Mcgee, MD   1 year ago [place], Cammie Mcgee, MD              Passed - Patient is not pregnant

## 2022-12-21 ENCOUNTER — Telehealth: Payer: BC Managed Care – PPO | Admitting: Physician Assistant

## 2022-12-21 DIAGNOSIS — N390 Urinary tract infection, site not specified: Secondary | ICD-10-CM

## 2022-12-21 NOTE — Progress Notes (Signed)
Because of recurring symptoms after antibiotic treatment and concern for ascending infection, I feel your condition warrants further evaluation and I recommend that you be seen in a face to face visit.   NOTE: There will be NO CHARGE for this eVisit   If you are having a true medical emergency please call 911.      For an urgent face to face visit, [facility] has seven urgent care centers for your convenience:     [facility] [place] Get Driving Directions [phone] [place], [place] [address]    [facility] [place] [facility]) Get Driving Directions [phone] [place], [place] [address]  [facility] [place] ([place]) Get Driving Directions [phone] [address] [place],  [place]  [address]  [facility] [place] [facility] - at [address] Get Driving Directions  [phone] [address] [place],  [place] [address]   [facility] Get Driving Directions [phone] [place], [place], [place] [address]   [facility] Get Driving Directions  [phone] [address], [place] [address]   [facility] [place] Get Driving Directions [phone] [address], [place],  [address]  Your MyChart E-visit questionnaire answers were reviewed by a board certified advanced clinical practitioner to complete your personal care plan based on your specific symptoms.  Thank you for using e-Visits.

## 2022-12-25 ENCOUNTER — Ambulatory Visit: Payer: BC Managed Care – PPO | Admitting: Family Medicine

## 2022-12-27 ENCOUNTER — Telehealth: Payer: Self-pay | Admitting: Family Medicine

## 2022-12-27 ENCOUNTER — Other Ambulatory Visit: Payer: Self-pay

## 2022-12-27 DIAGNOSIS — G43829 Menstrual migraine, not intractable, without status migrainosus: Secondary | ICD-10-CM

## 2022-12-27 MED ORDER — AIMOVIG 140 MG/ML ~~LOC~~ SOAJ: 140.0000 mg | SUBCUTANEOUS | 3 refills | Status: DC

## 2022-12-27 NOTE — Telephone Encounter (Signed)
Prescription Request  12/27/2022  Is this a "Controlled Substance" medicine? No  LOV: 07/07/2022  What is the name of the medication or equipment?   Erenumab-aooe (AIMOVIG) 140 MG/ML SOAJ   Have you contacted your pharmacy to request a refill? Yes   Which pharmacy would you like this sent to?  [id number]: [phone]: [phone]   Patient notified that their request is being sent to the clinical staff for review and that they should receive a response within 2 business days.   Please advise pharmacist at [phone]

## 2022-12-28 NOTE — Telephone Encounter (Signed)
Erroneous encounter. Please disregard.

## 2023-01-02 ENCOUNTER — Other Ambulatory Visit: Payer: Self-pay | Admitting: Family Medicine

## 2023-01-02 ENCOUNTER — Encounter: Payer: Self-pay | Admitting: Family Medicine

## 2023-01-02 NOTE — Telephone Encounter (Signed)
Requested medication (s) are due for refill today: yes  Requested medication (s) are on the active medication list: yes  Last refill:  10/09/22  Future visit scheduled: yes  Notes to clinic:  Unable to refill per protocol, cannot delegate.      Requested Prescriptions  Pending Prescriptions Disp Refills   cyclobenzaprine (FLEXERIL) 10 MG tablet [Pharmacy Med Name: CYCLOBENZAPRINE 10 MG TABLET] 60 tablet 1    Sig: TAKE 1 TABLET BY MOUTH EVERY 8 HOURS AS NEEDED FOR MUSCLE SPASMS     Not Delegated - Analgesics:  Muscle Relaxants Failed - 01/02/2023  9:01 AM      Failed - This refill cannot be delegated      Failed - Valid encounter within last 6 months    Recent Outpatient Visits           1 year ago Fatigue, unspecified type   [place] Susy Frizzle, MD   1 year ago Palpitations   [place] Eulogio Bear, NP   1 year ago [place] Eulogio Bear, NP   1 year ago Neck pain   [place] Dennard Schaumann, Cammie Mcgee, MD   1 year ago [place] Pickard, Cammie Mcgee, MD

## 2023-01-16 ENCOUNTER — Telehealth: Payer: Self-pay

## 2023-01-16 NOTE — Telephone Encounter (Signed)
PRIOR AUTH FOR AIMOVIG SENT TO COVER MY MEDS:  Danelle Earthly (Key: [id number])  Your information has been submitted to [place]. To check for an updated outcome later, reopen this PA request from your dashboard.  If [employer] has not responded to your request within 24 hours, contact [place] at [phone]. If you think there may be a problem with your PA request, use our live chat feature at the bottom right.

## 2023-01-19 ENCOUNTER — Other Ambulatory Visit: Payer: Self-pay | Admitting: Family Medicine

## 2023-01-19 NOTE — Telephone Encounter (Signed)
Requested medication (s) are due for refill today - yes  Requested medication (s) are on the active medication list -yes  Future visit scheduled -no  Last refill: 12/12/22 #30  Notes to clinic: non delegated Rx  Requested Prescriptions  Pending Prescriptions Disp Refills   ALPRAZolam (XANAX) 0.5 MG tablet [Pharmacy Med Name: ALPRAZOLAM 0.5 MG TABLET] 30 tablet 0    Sig: TAKE 1 TABLET BY MOUTH TWICE A DAY AS NEEDED FOR ANXIETY     Not Delegated - Psychiatry: Anxiolytics/Hypnotics 2 Failed - 01/19/2023  7:52 AM      Failed - This refill cannot be delegated      Failed - Urine Drug Screen completed in last 360 days      Failed - Valid encounter within last 6 months    Recent Outpatient Visits           1 year ago Fatigue, unspecified type   [place] Pickard, Cammie Mcgee, MD   1 year ago Palpitations   [place], Jessica A, NP   1 year ago [place], Jessica A, NP   1 year ago Neck pain   [place] Dennard Schaumann, Cammie Mcgee, MD   1 year ago [place], Cammie Mcgee, MD              Passed - Patient is not pregnant         Requested Prescriptions  Pending Prescriptions Disp Refills   ALPRAZolam (XANAX) 0.5 MG tablet [Pharmacy Med Name: ALPRAZOLAM 0.5 MG TABLET] 30 tablet 0    Sig: TAKE 1 TABLET BY MOUTH TWICE A DAY AS NEEDED FOR ANXIETY     Not Delegated - Psychiatry: Anxiolytics/Hypnotics 2 Failed - 01/19/2023  7:52 AM      Failed - This refill cannot be delegated      Failed - Urine Drug Screen completed in last 360 days      Failed - Valid encounter within last 6 months    Recent Outpatient Visits           1 year ago Fatigue, unspecified type   [place] Susy Frizzle, MD   1 year ago Palpitations   [place] Eulogio Bear, NP   1 year ago [place] Eulogio Bear, NP   1 year ago Neck pain   [place] Dennard Schaumann, Cammie Mcgee, MD   1 year ago [place], Cammie Mcgee, MD              Passed - Patient is not pregnant

## 2023-01-22 ENCOUNTER — Other Ambulatory Visit: Payer: Self-pay | Admitting: Family Medicine

## 2023-01-22 DIAGNOSIS — Z1231 Encounter for screening mammogram for malignant neoplasm of breast: Secondary | ICD-10-CM

## 2023-01-30 ENCOUNTER — Encounter: Payer: Self-pay | Admitting: Family Medicine

## 2023-02-11 ENCOUNTER — Other Ambulatory Visit: Payer: Self-pay | Admitting: Family Medicine

## 2023-02-16 ENCOUNTER — Other Ambulatory Visit: Payer: Self-pay | Admitting: Family Medicine

## 2023-02-16 ENCOUNTER — Other Ambulatory Visit: Payer: Self-pay

## 2023-02-16 ENCOUNTER — Encounter: Payer: Self-pay | Admitting: Family Medicine

## 2023-02-16 MED ORDER — OXYCODONE-ACETAMINOPHEN 5-325 MG PO TABS: 1.0000 | ORAL_TABLET | ORAL | 0 refills | Status: AC | PRN

## 2023-02-21 ENCOUNTER — Encounter: Payer: Self-pay | Admitting: Family Medicine

## 2023-02-27 ENCOUNTER — Other Ambulatory Visit: Payer: Self-pay | Admitting: Family Medicine

## 2023-02-27 ENCOUNTER — Telehealth: Payer: Self-pay

## 2023-02-27 NOTE — Telephone Encounter (Signed)
PA SENT TO Tommie SamsDanelle Earthly ([id number])  Your information has been submitted to [place]. To check for an updated outcome later, reopen this PA request from your dashboard.  If [employer] has not responded to your request within 24 hours, contact [place] at [phone]. If you think there may be a problem with your PA request, use our live chat feature at the bottom right.

## 2023-03-06 NOTE — Telephone Encounter (Signed)
PA-Oxycodone has been approve.   Sent Mychart msg via pt.   Good Morning, Andrea [name]  Your PA for Oxycodone has been approved effective 02/27/2023-08/30/2023.  You can check with your pharmacy when it will be ready for you to pick up. If you have any other questions please contact our office at [phone].  Thank you and have a great day.

## 2023-03-07 ENCOUNTER — Other Ambulatory Visit: Payer: Self-pay | Admitting: Family Medicine

## 2023-03-07 NOTE — Telephone Encounter (Signed)
Requested medication (s) are due for refill today: yes  Requested medication (s) are on the active medication list: yes  Last refill:  01/02/23 #60/0  Future visit scheduled: no  Notes to clinic:  not delegated, due for CPE, called pt, LVMTCB to schedule      Requested Prescriptions  Pending Prescriptions Disp Refills   cyclobenzaprine (FLEXERIL) 10 MG tablet [Pharmacy Med Name: CYCLOBENZAPRINE 10 MG TABLET] 60 tablet 0    Sig: TAKE 1 TABLET BY MOUTH EVERY 8 HOURS AS NEEDED FOR MUSCLE SPASM     Not Delegated - Analgesics:  Muscle Relaxants Failed - 03/07/2023  9:49 AM      Failed - This refill cannot be delegated      Failed - Valid encounter within last 6 months    Recent Outpatient Visits           1 year ago Fatigue, unspecified type   [place] Susy Frizzle, MD   1 year ago Palpitations   [place], Jessica A, NP   1 year ago [place] Eulogio Bear, NP   1 year ago Neck pain   [place] Dennard Schaumann, Cammie Mcgee, MD   1 year ago [place] Pickard, Cammie Mcgee, MD

## 2023-03-27 ENCOUNTER — Other Ambulatory Visit: Payer: Self-pay | Admitting: Family Medicine

## 2023-03-27 ENCOUNTER — Ambulatory Visit (INDEPENDENT_AMBULATORY_CARE_PROVIDER_SITE_OTHER): Payer: BC Managed Care – PPO | Admitting: Family Medicine

## 2023-03-27 ENCOUNTER — Encounter: Payer: Self-pay | Admitting: Family Medicine

## 2023-03-27 VITALS — BP 124/72 | HR 97 | Temp 97.8°F | Ht 60.0 in | Wt 178.8 lb

## 2023-03-27 DIAGNOSIS — M2550 Pain in unspecified joint: Secondary | ICD-10-CM

## 2023-03-27 DIAGNOSIS — Z13220 Encounter for screening for lipoid disorders: Secondary | ICD-10-CM

## 2023-03-27 NOTE — Progress Notes (Signed)
Subjective:    Patient ID: Andrea [name], female    DOB: 11/22/1971, 52 y.o.   MRN: [id number]  Patient presents today for an annual checkup.  She sees a gynecologist who performs her pelvic exams and orders her mammograms.  Her colonoscopy is up-to-date.  She is due for regular lab work.  Her immunizations are up-to-date although we did discuss a COVID booster and a shingles vaccine.  She continues to suffer with severe joint pains.  She complains of pain in her neck, in her shoulders, in both hands, both wrist.  She states that the pain can be debilitating and is present on a daily basis.  Previously she was taking Cymbalta but she stopped Cymbalta because she felt that it was not helping her pain at all.  She states that after her husband rubs her shoulders she will have lingering pain that can last hours even though he is not rubbing her shoulders forward.  She has been diagnosed with fibromyalgia in the past.  She tried Lyrica and gabapentin without any relief of the pain.  She has never tried nortriptyline or amitriptyline.  She has never tried [place].  She Topamax for migraines but saw no benefit.  She does take Flexeril at night to help her rest and to help with the muscle pain.  She was also written to take Celebrex but she has not been taking that.  She is uncertain if it helped in the past Past Medical History:  Diagnosis Date   Abnormal uterine bleeding    Pt follows with Ellison Hughs, MD. She is scheduled for a hysterectomy on 09/07/22.   Angiolipoma of kidney    around 8 years ago per pt on 08/29/22, benign   Anxiety    follows w/ PCP Dr. Tanya Nones, LOV 07/07/22   Arthritis    "hands" (09/02/2013), neck   Asthma    seasonal, follows with PCP, Dr. Tanya Nones   B12 deficiency    Cervical polyp 2023   Epicondylitis, lateral    Right   Fibromyalgia    sees PCP, Dr. Tanya Nones, LOV 07/07/22 in Epic   GERD (gastroesophageal reflux disease)    History of recurrent UTIs    Hx of multiple  UTIs. As of 08/23/22, most recent UTI on 08/16/22 treated with Macrobid 100 mg bid x 7 days. Urine culture negative. See OV notes from Kurtis Bushman, FNP in [place], dated 08/16/22 & 08/18/22.   IBS (irritable bowel syndrome)    .   Migraine headache    Pt takes Emgality monthly injections and follows with PCP, Dr. Lynnea Ferrier @ [facility].   Palpitations 06/2022   Hx of heart palpitations, PVCs. Pt had increased heart palpitations in 06/2022 during a urinary tract infection and fever. 06/13/22 EKG in Epic.   Past Surgical History:  Procedure Laterality Date   APPENDECTOMY  12/11/1982   CATARACT EXTRACTION Bilateral 2017   COLONOSCOPY  06/05/2022   One 7 mm sessile sigmoid polyp   CYSTOSCOPY N/A 09/07/2022   Procedure: CYSTOSCOPY;  Surgeon: Carlisle Cater, MD;  Location: [facility];  Service: Gynecology;  Laterality: N/A;   HYSTEROSCOPY  11/2021   LAPAROSCOPIC BILATERAL SALPINGECTOMY Bilateral 09/07/2022   Procedure: LAPAROSCOPIC BILATERAL SALPINGECTOMY;  Surgeon: Carlisle Cater, MD;  Location[facility];  Service: Gynecology;  Laterality: Bilateral;   LAPAROSCOPIC SUPRACERVICAL HYSTERECTOMY N/A 09/07/2022   Procedure: ABORTED TOTAL LAPAROSCOPIC HYSTERECTOMY / laparoscopic assisted vaginal hysterectomy;  Surgeon: Carlisle Cater,  MD;  Location: [facility];  Service: Gynecology;  Laterality: N/A;   PALATAL EXPANSION  12/11/2002   REDUCTION MAMMAPLASTY Bilateral ~ 2009   Current Outpatient Medications on File Prior to Visit  Medication Sig Dispense Refill   albuterol (VENTOLIN HFA) 108 (90 Base) MCG/ACT inhaler INHALE 2 PUFFS INTO THE LUNGS EVERY 6 HOURS AS NEEDED FOR WHEEZE OR SHORTNESS OF BREATH 8.5 each 2   ALPRAZolam (XANAX) 0.5 MG tablet TAKE 1 TABLET BY MOUTH TWICE A DAY AS NEEDED FOR ANXIETY 30 tablet 0   cyclobenzaprine (FLEXERIL) 10 MG tablet TAKE 1 TABLET BY MOUTH EVERY 8 HOURS AS NEEDED FOR MUSCLE SPASM 60 tablet 0    docusate sodium (COLACE) 100 MG capsule Take 200 mg by mouth daily as needed for mild constipation.     EMGALITY 120 MG/ML SOAJ SMARTSIG:120 Milligram(s) SUB-Q Once a Month     linaclotide (LINZESS) 290 MCG CAPS capsule TAKE 1 CAPSULE BY MOUTH DAILY BEFORE BREAKFAST. 30 capsule 1   promethazine (PHENERGAN) 25 MG tablet TAKE 1 TABELT BY MOUTH EVERY 4 HOURS (Patient taking differently: every 4 (four) hours as needed. TAKE 1 TABELT BY MOUTH EVERY 4 HOURS) 20 tablet 1   sodium chloride (OCEAN) 0.65 % SOLN nasal spray Place 1 spray into both nostrils as needed for congestion.     celecoxib (CELEBREX) 200 MG capsule Take 200 mg by mouth daily as needed for mild pain. Rarely takes (Patient not taking: Reported on 03/27/2023)     Erenumab-aooe (AIMOVIG) 140 MG/ML SOAJ Inject 140 mg into the skin every 30 (thirty) days. (Patient not taking: Reported on 03/27/2023) 1.12 mL 3   Current Facility-Administered Medications on File Prior to Visit  Medication Dose Route Frequency Provider Last Rate Last Admin   betamethasone acetate-betamethasone sodium phosphate (CELESTONE) injection 3 mg  3 mg Intra-articular Once Felecia Shelling, DPM       Allergies  Allergen Reactions   Cefdinir Hives   Prednisone Rash     Only when takes in large doses   Macrolides And Ketolides     Headache,shortness of breath, heavy chest   Ceftin [Cefuroxime Axetil] Rash   Sulfa Antibiotics Rash   Social History   Socioeconomic History   Marital status: Married    Spouse name: Not on file   Number of children: Not on file   Years of education: Not on file   Highest education level: Associate degree: occupational, Scientist, product/process development, or vocational program  Occupational History   Occupation: Midwife  Tobacco Use   Smoking status: Never   Smokeless tobacco: Never  Vaping Use   Vaping Use: Never used  Substance and Sexual Activity   Alcohol use: No   Drug use: No   Sexual activity: Not on file  Other Topics Concern    Not on file  Social History Narrative   Not on file   Social Determinants of Health   Financial Resource Strain: Low Risk  (03/23/2023)   Overall Financial Resource Strain (CARDIA)    Difficulty of Paying Living Expenses: Not hard at all  Food Insecurity: No Food Insecurity (03/23/2023)   Hunger Vital Sign    Worried About Running Out of Food in the Last Year: Never true    Ran Out of Food in the Last Year: Never true  Transportation Needs: No Transportation Needs (03/23/2023)   PRAPARE - Administrator, Civil Service (Medical): No    Lack of Transportation (Non-Medical): No  Physical Activity: Unknown (  03/23/2023)   Exercise Vital Sign    Days of Exercise per Week: Patient declined    Minutes of Exercise per Session: Not on file  Stress: Stress Concern Present (03/23/2023)   [employer] of Occupational Health - Occupational Stress Questionnaire    Feeling of Stress : To some extent  Social Connections: Unknown (03/23/2023)   Social Connection and Isolation Panel [NHANES]    Frequency of Communication with Friends and Family: More than three times a week    Frequency of Social Gatherings with Friends and Family: Three times a week    Attends Religious Services: Patient declined    Active Member of Clubs or Organizations: Patient declined    Attends Banker Meetings: Not on file    Marital Status: Married  Catering manager Violence: Not on file      Review of Systems  All other systems reviewed and are negative.      Objective:   Physical Exam Vitals reviewed.  Constitutional:      General: She is not in acute distress.    Appearance: She is normal weight. She is not ill-appearing, toxic-appearing or diaphoretic.  Cardiovascular:     Rate and Rhythm: Normal rate and regular rhythm.     Heart sounds: Normal heart sounds. No murmur heard.    No friction rub. No gallop.  Pulmonary:     Effort: Pulmonary effort is normal. No respiratory  distress.     Breath sounds: Normal breath sounds. No wheezing or rales.  Abdominal:     General: Abdomen is flat. Bowel sounds are normal.  Neurological:     Mental Status: She is alert.           Assessment & Plan:  Polyarthralgia - Plan: CK, CBC with Differential/Platelet, COMPLETE METABOLIC PANEL WITH GFR, Sedimentation rate, Rheumatoid factor, ANA  Screening cholesterol level - Plan: Lipid panel Given the diffuse aches and pains all over her body I would check a CK to evaluate for myositis.  I will check a sedimentation rate, rheumatoid factor, and an ANA to evaluate for any other autoimmune diseases.  She does have a previous history of fibromyalgia and I believe her history is consistent with that.  I will check a CBC a CMP and a fasting lipid panel as part of her regular annual labs.  If labs are completely normal I would recommend trying Celebrex 200 mg a day for fibromyalgia and potentially add nortriptyline at night for pain control versus gabapentin.  Patient could also use muscle relaxers more frequently than just at night.  However I would like to stop with taking a regular NSAID to see if this helps

## 2023-03-28 ENCOUNTER — Telehealth: Payer: Self-pay

## 2023-03-28 LAB — LIPID PANEL
Cholesterol: 208 mg/dL — ABNORMAL HIGH (ref <200)
HDL: 61 mg/dL (ref >=50)
LDL Cholesterol (Calc): 129 mg/dL (calc) — ABNORMAL HIGH
Non-HDL Cholesterol (Calc): 147 mg/dL (calc) — ABNORMAL HIGH (ref <130)
Total CHOL/HDL Ratio: 3.4 (calc) (ref <5.0)
Triglycerides: 84 mg/dL (ref <150)

## 2023-03-28 LAB — CBC WITH DIFFERENTIAL/PLATELET
Absolute Monocytes: 529 cells/uL (ref 200–950)
Basophils Absolute: 88 cells/uL (ref 0–200)
Basophils Relative: 0.9 %
Eosinophils Absolute: 108 cells/uL (ref 15–500)
Eosinophils Relative: 1.1 %
HCT: 37.3 % (ref 35.0–45.0)
Hemoglobin: 11.8 g/dL (ref 11.7–15.5)
Lymphs Abs: 2323 cells/uL (ref 850–3900)
MCH: 24.2 pg — ABNORMAL LOW (ref 27.0–33.0)
MCHC: 31.6 g/dL — ABNORMAL LOW (ref 32.0–36.0)
MCV: 76.4 fL — ABNORMAL LOW (ref 80.0–100.0)
MPV: 11.4 fL (ref 7.5–12.5)
Monocytes Relative: 5.4 %
Neutro Abs: 6752 cells/uL (ref 1500–7800)
Neutrophils Relative %: 68.9 %
Platelets: 314 10*3/uL (ref 140–400)
RBC: 4.88 10*6/uL (ref 3.80–5.10)
RDW: 14.6 % (ref 11.0–15.0)
Total Lymphocyte: 23.7 %
WBC: 9.8 10*3/uL (ref 3.8–10.8)

## 2023-03-28 LAB — COMPLETE METABOLIC PANEL WITH GFR
AG Ratio: 2 (calc) (ref 1.0–2.5)
ALT: 16 U/L (ref 6–29)
AST: 16 U/L (ref 10–35)
Albumin: 4.5 g/dL (ref 3.6–5.1)
Alkaline phosphatase (APISO): 89 U/L (ref 37–153)
BUN: 19 mg/dL (ref 7–25)
CO2: 28 mmol/L (ref 20–32)
Calcium: 9.8 mg/dL (ref 8.6–10.4)
Chloride: 105 mmol/L (ref 98–110)
Creat: 0.78 mg/dL (ref 0.50–1.03)
Globulin: 2.2 g/dL (calc) (ref 1.9–3.7)
Glucose, Bld: 102 mg/dL — ABNORMAL HIGH (ref 65–99)
Potassium: 4.2 mmol/L (ref 3.5–5.3)
Sodium: 140 mmol/L (ref 135–146)
Total Bilirubin: 0.2 mg/dL (ref 0.2–1.2)
Total Protein: 6.7 g/dL (ref 6.1–8.1)
eGFR: 92 mL/min/{1.73_m2} (ref >=60)

## 2023-03-28 LAB — RHEUMATOID FACTOR: Rheumatoid fact SerPl-aCnc: 12 IU/mL (ref <14)

## 2023-03-28 LAB — CK: Total CK: 49 U/L (ref 29–143)

## 2023-03-28 LAB — SEDIMENTATION RATE: Sed Rate: 6 mm/h (ref 0–30)

## 2023-03-28 NOTE — Telephone Encounter (Signed)
Littie Deeds (Key: [id number])   Your information has been submitted to [employer]. To check for an updated outcome later, reopen this PA request from your dashboard.  If [employer] has not responded to your request within 24 hours, contact [employer] at [phone]. If you think there may be a problem with your PA request, use our live chat feature at the bottom right.

## 2023-03-29 NOTE — Telephone Encounter (Signed)
PA for [facility] approved from 03/28/2023-03/27/2024. Pt advised via my chart message. Mjp,lpn

## 2023-03-30 LAB — ANTI-NUCLEAR AB-TITER (ANA TITER): ANA Titer 1: 1:160 {titer} — ABNORMAL HIGH

## 2023-03-30 LAB — ANA: Anti Nuclear Antibody (ANA): POSITIVE — AB

## 2023-04-03 ENCOUNTER — Other Ambulatory Visit: Payer: Self-pay

## 2023-04-03 DIAGNOSIS — R768 Other specified abnormal immunological findings in serum: Secondary | ICD-10-CM

## 2023-04-03 DIAGNOSIS — M2550 Pain in unspecified joint: Secondary | ICD-10-CM

## 2023-04-04 ENCOUNTER — Encounter: Payer: Self-pay | Admitting: Family Medicine

## 2023-04-05 ENCOUNTER — Other Ambulatory Visit: Payer: Self-pay | Admitting: Family Medicine

## 2023-04-05 MED ORDER — PREDNISONE 20 MG PO TABS
ORAL_TABLET | ORAL | 0 refills | Status: DC
Start: 2023-04-05 — End: 2023-06-26

## 2023-04-05 MED ORDER — OXYCODONE-ACETAMINOPHEN 5-325 MG PO TABS: 1.0000 | ORAL_TABLET | ORAL | 0 refills | Status: AC | PRN

## 2023-04-14 ENCOUNTER — Emergency Department (HOSPITAL_BASED_OUTPATIENT_CLINIC_OR_DEPARTMENT_OTHER)
Admission: EM | Admit: 2023-04-14 | Discharge: 2023-04-14 | Disposition: A | Discharge status: Home / Self Care | Payer: BC Managed Care – PPO | Attending: Emergency Medicine | Admitting: Emergency Medicine

## 2023-04-14 ENCOUNTER — Emergency Department (HOSPITAL_BASED_OUTPATIENT_CLINIC_OR_DEPARTMENT_OTHER): Payer: BC Managed Care – PPO

## 2023-04-14 ENCOUNTER — Encounter (HOSPITAL_BASED_OUTPATIENT_CLINIC_OR_DEPARTMENT_OTHER): Payer: Self-pay

## 2023-04-14 DIAGNOSIS — R000 Tachycardia, unspecified: Secondary | ICD-10-CM | POA: Diagnosis not present

## 2023-04-14 DIAGNOSIS — R0789 Other chest pain: Secondary | ICD-10-CM

## 2023-04-14 DIAGNOSIS — R519 Headache, unspecified: Secondary | ICD-10-CM | POA: Insufficient documentation

## 2023-04-14 DIAGNOSIS — R42 Dizziness and giddiness: Secondary | ICD-10-CM

## 2023-04-14 DIAGNOSIS — R55 Syncope and collapse: Secondary | ICD-10-CM | POA: Diagnosis present

## 2023-04-14 LAB — BASIC METABOLIC PANEL
Anion gap: 10 (ref 5–15)
BUN: 17 mg/dL (ref 6–20)
CO2: 24 mmol/L (ref 22–32)
Calcium: 9.2 mg/dL (ref 8.9–10.3)
Chloride: 103 mmol/L (ref 98–111)
Creatinine, Ser: 0.76 mg/dL (ref 0.44–1.00)
GFR, Estimated: >60 mL/min (ref >60)
Glucose, Bld: 109 mg/dL — ABNORMAL HIGH (ref 70–99)
Potassium: 3.5 mmol/L (ref 3.5–5.1)
Sodium: 137 mmol/L (ref 135–145)

## 2023-04-14 LAB — URINALYSIS, ROUTINE W REFLEX MICROSCOPIC
Bilirubin Urine: NEGATIVE
Glucose, UA: NEGATIVE mg/dL
Hgb urine dipstick: NEGATIVE
Ketones, ur: NEGATIVE mg/dL
Nitrite: NEGATIVE
Protein, ur: NEGATIVE mg/dL
Specific Gravity, Urine: <1.005 — ABNORMAL LOW (ref 1.005–1.030)
pH: 5.5 (ref 5.0–8.0)

## 2023-04-14 LAB — CBC
HCT: 40 % (ref 36.0–46.0)
Hemoglobin: 12.5 g/dL (ref 12.0–15.0)
MCH: 24.2 pg — ABNORMAL LOW (ref 26.0–34.0)
MCHC: 31.3 g/dL (ref 30.0–36.0)
MCV: 77.5 fL — ABNORMAL LOW (ref 80.0–100.0)
Platelets: 324 10*3/uL (ref 150–400)
RBC: 5.16 MIL/uL — ABNORMAL HIGH (ref 3.87–5.11)
RDW: 15 % (ref 11.5–15.5)
WBC: 9.8 10*3/uL (ref 4.0–10.5)
nRBC: 0 % (ref 0.0–0.2)

## 2023-04-14 LAB — D-DIMER, QUANTITATIVE: D-Dimer, Quant: <0.27 ug/mL-FEU (ref 0.00–0.50)

## 2023-04-14 LAB — TROPONIN I (HIGH SENSITIVITY)
Troponin I (High Sensitivity): <2 ng/L (ref <18)
Troponin I (High Sensitivity): <2 ng/L (ref <18)

## 2023-04-14 LAB — CBG MONITORING, ED: Glucose-Capillary: 113 mg/dL — ABNORMAL HIGH (ref 70–99)

## 2023-04-14 LAB — PREGNANCY, URINE: Preg Test, Ur: NEGATIVE

## 2023-04-14 MED ORDER — LACTATED RINGERS IV BOLUS
1000.0000 mL | Freq: Once | INTRAVENOUS | Status: AC
  Administered 2023-04-14: 1000 mL via INTRAVENOUS

## 2023-04-14 MED ORDER — ONDANSETRON HCL 4 MG/2ML IJ SOLN
4.0000 mg | Freq: Once | INTRAMUSCULAR | Status: AC
  Administered 2023-04-14: 4 mg via INTRAVENOUS
  Filled 2023-04-14: qty 2

## 2023-04-14 MED ORDER — METOCLOPRAMIDE HCL 5 MG/ML IJ SOLN: 10.0000 mg | Freq: Once | INTRAMUSCULAR | Status: DC

## 2023-04-14 MED ORDER — KETOROLAC TROMETHAMINE 30 MG/ML IJ SOLN: 15.0000 mg | Freq: Once | INTRAMUSCULAR | Status: DC

## 2023-04-14 MED ORDER — MECLIZINE HCL 25 MG PO TABS: 25.0000 mg | ORAL_TABLET | Freq: Three times a day (TID) | ORAL | 0 refills | Status: DC | PRN

## 2023-04-14 MED ORDER — ONDANSETRON 4 MG PO TBDP: 4.0000 mg | ORAL_TABLET | Freq: Three times a day (TID) | ORAL | 0 refills | Status: DC | PRN

## 2023-04-14 MED ORDER — IOHEXOL 350 MG/ML SOLN
80.0000 mL | Freq: Once | INTRAVENOUS | Status: AC | PRN
  Administered 2023-04-14: 80 mL via INTRAVENOUS

## 2023-04-14 MED ORDER — MECLIZINE HCL 25 MG PO TABS
25.0000 mg | ORAL_TABLET | Freq: Once | ORAL | Status: AC
  Administered 2023-04-14: 25 mg via ORAL
  Filled 2023-04-14: qty 1

## 2023-04-14 MED ORDER — DIPHENHYDRAMINE HCL 50 MG/ML IJ SOLN: 25.0000 mg | Freq: Once | INTRAMUSCULAR | Status: DC

## 2023-04-14 NOTE — ED Notes (Signed)
Dc instructions reviewed with patient. Patient voiced understanding. Dc with belongings.  °

## 2023-04-14 NOTE — ED Notes (Signed)
Pt ambulated 262ft, tolerated well. No dizziness ,but does report her head feeling kinda funny, foggy. Pt did not take the reglan/toradol/benadryl so those did not contribute to symptoms. Pt still has some tightness "funny feeling" in chest.

## 2023-04-14 NOTE — ED Notes (Signed)
Tolerate water

## 2023-04-14 NOTE — Discharge Instructions (Addendum)
Your testing is reassuring.  No evidence of any narrowing of blood vessels in your neck or head.  No evidence of heart attack.  Take the medication as prescribed for dizziness.  You declined transfer for MRI today.  Follow-up with your doctor.  The cardiologist should call you to help arrange a stress test next week.  Return to the ED with exertional chest pain, pain associate with shortness of breath, nausea, vomiting, sweating or other concerns.

## 2023-04-14 NOTE — ED Triage Notes (Signed)
She reports having recently finishing a steroid dosepack for cervical radiculopathy. She is here today with c/o "a headache for three days; and this morning, as I got up the room was spinning and I'm having a slight chest pressure".

## 2023-04-14 NOTE — ED Provider Notes (Signed)
[place] EMERGENCY DEPARTMENT AT [facility] Provider Note   CSN: [id number] Arrival date & time: 04/14/23  5621     History  Chief Complaint  Patient presents with   Vertigo/near-syncope    Andrea [name] is a 52 y.o. female.  Patient with a history of anxiety, fibromyalgia, migraine headaches, GERD presenting with dizziness onset this morning when she woke up about 5 AM.  Reports diffuse gradual onset headache for the past 3 to 4 days similar to her typical migraines.  Denies thunderclap onset.  Headache has been intermittent coming and going.  Not having a headache currently.  When she woke up this morning she felt room spinning dizziness and was quite dizzy with position change.  She did fall due to dizziness but did not pass out or lose consciousness.  Denies head injury.  Denies any neck pain or back pain.  She recently completed a course of steroids for cervical radiculopathy about 4 days ago.  She was on prednisone for about a week due to neck pain that was attributed to cervical radiculopathy.  This is improved.  She has no weakness, numbness or tingling.  No difficulty speaking or difficulty swallowing.  No visual changes.  No fever.  Has never had a dizziness before with her migraines.  Additionally reports intermittent chest "pressure" for the past 3 days that comes and goes lasting for few minutes at a time.  Happens randomly and is not exertional or pleuritic.  No radiation of the pain.  Describes palpitations which she attributes to prednisone use.  Denies any history of CAD.  Did have CT coronary study in 2018 that showed no calcifications. Not having the chest pain currently.  She reports the most of last for 2 minutes at a time.  Known to have an abnormal EKG.  The history is provided by the patient.       Home Medications Prior to Admission medications   Medication Sig Start Date End Date Taking? Authorizing Provider  predniSONE (DELTASONE) 20 MG tablet 3  tabs poqday 1-2, 2 tabs poqday 3-4, 1 tab poqday 5-6 04/05/23   Donita Brooks, MD  albuterol (VENTOLIN HFA) 108 (90 Base) MCG/ACT inhaler INHALE 2 PUFFS INTO THE LUNGS EVERY 6 HOURS AS NEEDED FOR WHEEZE OR SHORTNESS OF BREATH 08/19/21   Donita Brooks, MD  ALPRAZolam Prudy Feeler) 0.5 MG tablet TAKE 1 TABLET BY MOUTH TWICE A DAY AS NEEDED FOR ANXIETY 03/27/23   Donita Brooks, MD  celecoxib (CELEBREX) 200 MG capsule Take 200 mg by mouth daily as needed for mild pain. Rarely takes Patient not taking: Reported on 03/27/2023 06/25/21   [provider]  cyclobenzaprine (FLEXERIL) 10 MG tablet TAKE 1 TABLET BY MOUTH EVERY 8 HOURS AS NEEDED FOR MUSCLE SPASMS 04/05/23   Donita Brooks, MD  docusate sodium (COLACE) 100 MG capsule Take 200 mg by mouth daily as needed for mild constipation.    [provider]  EMGALITY 120 MG/ML SOAJ SMARTSIG:120 Milligram(s) SUB-Q Once a Month 01/30/23   [provider]  Erenumab-aooe (AIMOVIG) 140 MG/ML SOAJ Inject 140 mg into the skin every 30 (thirty) days. Patient not taking: Reported on 03/27/2023 12/27/22   Donita Brooks, MD  linaclotide (LINZESS) 290 MCG CAPS capsule TAKE 1 CAPSULE BY MOUTH DAILY BEFORE BREAKFAST. 10/25/22   Donita Brooks, MD  promethazine (PHENERGAN) 25 MG tablet TAKE 1 TABELT BY MOUTH EVERY 4 HOURS Patient taking differently: every 4 (four) hours as needed. TAKE  1 TABELT BY MOUTH EVERY 4 HOURS 06/29/22   Donita Brooks, MD  sodium chloride (OCEAN) 0.65 % SOLN nasal spray Place 1 spray into both nostrils as needed for congestion.    [provider]      Allergies    Cefdinir, Prednisone, Macrolides and ketolides, Ceftin [cefuroxime axetil], and Sulfa antibiotics    Review of Systems   Review of Systems  Constitutional:  Negative for activity change, appetite change and fever.  HENT:  Negative for congestion and rhinorrhea.   Eyes:  Negative for visual disturbance.  Respiratory:  Positive for chest  tightness. Negative for shortness of breath.   Cardiovascular:  Positive for chest pain.  Gastrointestinal:  Negative for abdominal pain, nausea and vomiting.  Genitourinary:  Negative for dysuria.  Musculoskeletal:  Negative for arthralgias and myalgias.  Skin:  Negative for rash.  Neurological:  Positive for dizziness, weakness and headaches. Negative for syncope and numbness.   all other systems are negative except as noted in the HPI and PMH.    Physical Exam Updated Vital Signs BP 122/76   Pulse 84   Temp 98.2 F (36.8 C) (Oral)   Resp 17   LMP  (LMP Unknown)   SpO2 (!) 75%  Physical Exam Vitals and nursing note reviewed.  Constitutional:      General: She is not in acute distress.    Appearance: She is well-developed.  HENT:     Head: Normocephalic and atraumatic.     Mouth/Throat:     Pharynx: No oropharyngeal exudate.  Eyes:     Conjunctiva/sclera: Conjunctivae normal.     Pupils: Pupils are equal, round, and reactive to light.  Neck:     Comments: No meningismus. Cardiovascular:     Rate and Rhythm: Normal rate and regular rhythm.     Heart sounds: Normal heart sounds. No murmur heard. Pulmonary:     Effort: Pulmonary effort is normal. No respiratory distress.     Breath sounds: Normal breath sounds.  Abdominal:     Palpations: Abdomen is soft.     Tenderness: There is no abdominal tenderness. There is no guarding or rebound.  Musculoskeletal:        General: No tenderness. Normal range of motion.     Cervical back: Normal range of motion and neck supple.  Skin:    General: Skin is warm.  Neurological:     Mental Status: She is alert and oriented to person, place, and time.     Cranial Nerves: No cranial nerve deficit.     Motor: No abnormal muscle tone.     Coordination: Coordination normal.     Comments: CN 2-12 intact, no ataxia on finger to nose, no nystagmus, 5/5 strength throughout, no pronator drift, Romberg positive but normal gait. Head impulse  testing negative, test of skew negative.  No nystagmus visualized  Psychiatric:        Behavior: Behavior normal.     ED Results / Procedures / Treatments   Labs (all labs ordered are listed, but only abnormal results are displayed) Labs Reviewed  BASIC METABOLIC PANEL - Abnormal; Notable for the following components:      Result Value   Glucose, Bld 109 (*)    All other components within normal limits  CBC - Abnormal; Notable for the following components:   RBC 5.16 (*)    MCV 77.5 (*)    MCH 24.2 (*)    All other components within normal limits  URINALYSIS,  ROUTINE W REFLEX MICROSCOPIC - Abnormal; Notable for the following components:   Color, Urine STRAW (*)    APPearance HAZY (*)    Specific Gravity, Urine <1.005 (*)    Leukocytes,Ua LARGE (*)    Bacteria, UA RARE (*)    All other components within normal limits  CBG MONITORING, ED - Abnormal; Notable for the following components:   Glucose-Capillary 113 (*)    All other components within normal limits  PREGNANCY, URINE  D-DIMER, QUANTITATIVE  TROPONIN I (HIGH SENSITIVITY)  TROPONIN I (HIGH SENSITIVITY)    EKG EKG Interpretation  Date/Time:  Saturday Apr 14 2023 07:30:51 EDT Ventricular Rate:  101 PR Interval:  118 QRS Duration: 68 QT Interval:  328 QTC Calculation: 425 R Axis:   59 Text Interpretation: Sinus tachycardia with occasional Premature ventricular complexes T wave abnormality, consider anterior ischemia Abnormal ECG When compared with ECG of 13-Jun-2022 09:19, Significant changes have occurred No significant change was found Confirmed by Glynn Octave [phone]) on 04/14/2023 7:45:22 AM  Radiology CT ANGIO HEAD NECK W WO CM  Result Date: 04/14/2023 CLINICAL DATA:  52 year old female with persistent headache for 3 days, chest pressure, room spinning. EXAM: CT ANGIOGRAPHY HEAD AND NECK WITH AND WITHOUT CONTRAST TECHNIQUE: Multidetector CT imaging of the head and neck was performed using the standard  protocol during bolus administration of intravenous contrast. Multiplanar CT image reconstructions and MIPs were obtained to evaluate the vascular anatomy. Carotid stenosis measurements (when applicable) are obtained utilizing NASCET criteria, using the distal internal carotid diameter as the denominator. RADIATION DOSE REDUCTION: This exam was performed according to the departmental dose-optimization program which includes automated exposure control, adjustment of the mA and/or kV according to patient size and/or use of iterative reconstruction technique. CONTRAST:  80mL OMNIPAQUE IOHEXOL 350 MG/ML SOLN COMPARISON:  Head CT 07/18/2022. FINDINGS: CT HEAD Brain: Cerebral volume remains normal for age. No midline shift, ventriculomegaly, mass effect, evidence of mass lesion, intracranial hemorrhage or evidence of cortically based acute infarction. Gray-white matter differentiation is within normal limits throughout the brain. Calvarium and skull base: No acute osseous abnormality identified. Paranasal sinuses: Visualized paranasal sinuses and mastoids are clear. Orbits: Visualized orbits and scalp soft tissues are within normal limits. CTA NECK Skeleton: Ordinary cervical spine disc and endplate degeneration C4-C5, C5-C6. Chronic anterior C1-odontoid degeneration. No acute osseous abnormality identified. Upper chest: Negative. Other neck: Negative. Aortic arch: 3 vessel arch.  Mild arch tortuosity. Right carotid system: Mild motion artifact at the thoracic inlet. Otherwise negative right CCA. Negative right carotid bifurcation, cervical right ICA. Left carotid system: Negative aside from mild motion artifact, tortuous left ICA distal to the bulb. Vertebral arteries: Negative aside from mild motion artifact at the thoracic inlet. Codominant cervical vertebral arteries. CTA HEAD Posterior circulation: Distal vertebral arteries are codominant, negative. Patent vertebrobasilar junction, right PICA origin. Left AICA  appears dominant and patent. Patent basilar artery without stenosis. Patent SCA and PCA origins. Posterior communicating arteries are diminutive or absent. Bilateral PCA branches are within normal limits. Anterior circulation: Both ICA siphons are patent. Tortuous cavernous segments. No siphon plaque or tortuosity. Patent carotid termini, MCA and ACA origins. Anterior communicating artery and bilateral ACA branches are within normal limits. Left MCA M1 segment, bifurcation are patent and negative. Right MCA M1 segment and trifurcation are negative. Bilateral MCA branches are within normal limits. Venous sinuses: Patent. Anatomic variants: None significant. Review of the MIP images confirms the above findings IMPRESSION: 1. Normal CTA Head and Neck.  Normal CT  appearance of the brain. 2. Cervical spine degeneration C1-C2, C4-C5, C5-C6. Electronically Signed   By: Odessa Fleming M.D.   On: 04/14/2023 09:10    Procedures Procedures    Medications Ordered in ED Medications  lactated ringers bolus 1,000 mL (has no administration in time range)  ondansetron (ZOFRAN) injection 4 mg (has no administration in time range)  meclizine (ANTIVERT) tablet 25 mg (has no administration in time range)    ED Course/ Medical Decision Making/ A&P                             Medical Decision Making Amount and/or Complexity of Data Reviewed Independent Historian: spouse Labs: ordered. Decision-making details documented in ED Course. Radiology: ordered and independent interpretation performed. Decision-making details documented in ED Course. ECG/medicine tests: ordered and independent interpretation performed. Decision-making details documented in ED Course.  Risk Prescription drug management.   Gradual onset headache for the past 3 days typical of her migraines.  Room spinning dizziness onset this morning.  Neurological exam is nonfocal.  No ataxia or nystagmus.  Suspect likely peripheral vertigo.  Additionally  having intermittent chest pressure for the past several days.  EKG does show anterior lateral T wave inversions similar to previous. Pain sounds atypical for ACS.  It is brief and intermittent.  She did have negative CT coronary study in 2018.  Patient with likely peripheral vertigo.  She does have a positive Romberg on exam but a normal gait.  Gradual onset headache similar to previous migraines.  Low suspicion for subarachnoid hemorrhage, meningitis, temporal arteritis.  Labs reassuring.  Troponin negative.  Low suspicion for ACS, PE, aortic dissection.  D-dimer is negative.  Will trend troponin. UA with possible infection. Denies any UTI symptoms.  Will send culture.  CTA negative for large vessel occlusion or aneurysm.  Results reviewed interpreted by me.  No hemorrhage.  Orthostatics are negative.  She reports dizziness has improved.  Troponin negative with low suspicion for ACS.  Discussed low suspicion for CVA.  MRI not available at this facility.  She declines transfer for MRI and agrees that peripheral vertigo more likely than CVA.  Patient tolerating p.o. and ambulatory.  Reports she is not feeling dizzy but "foggy" in her head.  No residual headache. Troponin negative x 2.  Will refer to cardiology for consideration of updating her ischemic testing.  Low suspicion for ACS or pulmonary embolism today.  She declines transfer for MRI to rule out CVA but low suspicion for this as well.  She feels improved with treatment for peripheral vertigo.  Follow-up with PCP.  Return to the ED with exertional chest pain, pain associate with shortness of breath, nausea, vomiting, diaphoresis or any other concerns         Final Clinical Impression(s) / ED Diagnoses Final diagnoses:  None    Rx / DC Orders ED Discharge Orders     None         Lemon Whitacre, Jeannett Senior, MD 04/14/23 1129

## 2023-04-15 ENCOUNTER — Encounter: Payer: Self-pay | Admitting: Family Medicine

## 2023-04-15 LAB — URINE CULTURE: Culture: <10000 — AB

## 2023-04-16 ENCOUNTER — Telehealth: Payer: Self-pay

## 2023-04-16 NOTE — Transitions of Care (Post Inpatient/ED Visit) (Signed)
04/16/2023  Name: Andrea [name] MRN: [id number] DOB: 1971-02-09  Today's TOC FU Call Status: Today's TOC FU Call Status:: Successful TOC FU Call Competed TOC FU Call Complete Date: 04/16/23  Transition Care Management Follow-up Telephone Call Date of Discharge: 04/14/23 Discharge Facility: [facility] ([facility]) Type of Discharge: Emergency Department Reason for ED Visit: Other: How have you been since you were released from the hospital?: Better Any questions or concerns?: Yes Patient Questions/Concerns:: referred to Cardiology by ER but wants to know if that is necessary, she would like Dr. Caren [name] opinion. Patient Questions/Concerns Addressed: Notified Provider of Patient Questions/Concerns  Items Reviewed: Did you receive and understand the discharge instructions provided?: Yes Medications obtained,verified, and reconciled?: Yes (Medications Reviewed) Any new allergies since your discharge?: No Dietary orders reviewed?: NA Do you have support at home?: Yes  Medications Reviewed Today: Medications Reviewed Today     Reviewed by Andrea [name] (Physician Assistant Certified) on 12/07/22 at 1740  Med List Status: <None>   Medication Order Taking? Sig Documenting Provider Last Dose Status Informant  albuterol (VENTOLIN HFA) 108 (90 Base) MCG/ACT inhaler [id number] No INHALE 2 PUFFS INTO THE LUNGS EVERY 6 HOURS AS NEEDED FOR WHEEZE OR SHORTNESS OF BREATH Andrea Brooks, MD More than a month Active Self  ALPRAZolam (XANAX) 0.5 MG tablet [id number]  TAKE 1 TABLET BY MOUTH TWICE A DAY AS NEEDED FOR ANXIETY Andrea Brooks, MD  Active   betamethasone acetate-betamethasone sodium phosphate (CELESTONE) injection 3 mg [id number]   Andrea [name], DPM  Active   Black Pepper-Turmeric (TURMERIC CURCUMIN) 04-999 MG CAPS [id number] No Take 3 tablets by mouth. [provider] Past Week Active Self  celecoxib (CELEBREX) 200 MG capsule [id number] No Take 200 mg by  mouth daily as needed for mild pain. Rarely takes [provider] Past Month Active Self  cyclobenzaprine (FLEXERIL) 10 MG tablet [id number]  TAKE 1 TABLET BY MOUTH EVERY 8 HOURS AS NEEDED FOR MUSCLE SPASM Andrea Brooks, MD  Active   diclofenac (VOLTAREN) 75 MG EC tablet [id number] No TAKE 1 TABLET BY MOUTH TWICE A DAY  Patient not taking: Reported on 08/29/2022   Andrea Brooks, MD More than a month Active   docusate sodium (COLACE) 100 MG capsule [id number] No Take 200 mg by mouth daily as needed for mild constipation. [provider] Past Week Active Self  DULoxetine (CYMBALTA) 60 MG capsule [id number]  TAKE 1 CAPSULE BY MOUTH EVERY DAY [name], Andrea [place], MD  Active   Erenumab-aooe (AIMOVIG) 140 MG/ML Ivory Broad [id number]  Inject 140 mg into the skin every 30 (thirty) days. Andrea Brooks, MD  Active   linaclotide Karlene Einstein) 290 MCG CAPS capsule [id number]  TAKE 1 CAPSULE BY MOUTH DAILY BEFORE BREAKFAST. Andrea Brooks, MD  Active   nitrofurantoin, macrocrystal-monohydrate, (MACROBID) 100 MG capsule [id number] No Take 1 capsule (100 mg total) by mouth 2 (two) times daily. Andrea Meo, FNP Past Month Active Self  oxyCODONE-acetaminophen (PERCOCET/ROXICET) 5-325 MG tablet [id number]  Take 1 tablet by mouth every 4 (four) hours as needed for up to 5 days for severe pain. Andrea Brooks, MD  Active   Polyethyl Glycol-Propyl Glycol [facility] OP) [id number] No Apply 1 drop to eye daily as needed (dry eyes). [provider] Past Month Active Self  promethazine (PHENERGAN) 25 MG tablet [id number] No TAKE 1 TABELT BY MOUTH EVERY 4 HOURS  Patient taking differently: every 4 (four) hours as needed. TAKE 1 TABELT BY MOUTH EVERY  4 HOURS   Andrea Brooks, MD Past Month Active Self  sodium chloride (OCEAN) 0.65 % SOLN nasal spray [id number] No Place 1 spray into both nostrils as needed for congestion. [provider] More than a month Active Self            Home  Care and Equipment/Supplies: Were Home Health Services Ordered?: NA Any new equipment or medical supplies ordered?: NA  Functional Questionnaire: Do you need assistance with bathing/showering or dressing?: No Do you need assistance with meal preparation?: No Do you need assistance with eating?: No Do you have difficulty maintaining continence: No Do you need assistance with getting out of bed/getting out of a chair/moving?: No Do you have difficulty managing or taking your medications?: No  Follow up appointments reviewed: PCP Follow-up appointment confirmed?: Yes Date of PCP follow-up appointment?: 04/20/23 Follow-up Provider: Dr. Tanya [name] Specialist [facility] Follow-up appointment confirmed?: No (patient wants Dr. Caren [name] opinion on referral) Reason Specialist Follow-Up Not Confirmed: Patient has Specialist Provider Number and will Call for Appointment Do you need transportation to your follow-up appointment?: No Do you understand care options if your condition(s) worsen?: Yes-patient verbalized understanding    SIGNATURE Andrea [name], CMA (AAMA)  [facility] [phone]

## 2023-04-20 ENCOUNTER — Ambulatory Visit (INDEPENDENT_AMBULATORY_CARE_PROVIDER_SITE_OTHER): Payer: BC Managed Care – PPO | Admitting: Family Medicine

## 2023-04-20 ENCOUNTER — Ambulatory Visit: Payer: BC Managed Care – PPO | Attending: Family Medicine

## 2023-04-20 ENCOUNTER — Encounter: Payer: Self-pay | Admitting: Family Medicine

## 2023-04-20 VITALS — BP 120/84 | HR 95 | Temp 98.6°F | Ht 61.0 in | Wt 180.0 lb

## 2023-04-20 DIAGNOSIS — R002 Palpitations: Secondary | ICD-10-CM | POA: Diagnosis not present

## 2023-04-20 DIAGNOSIS — H8110 Benign paroxysmal vertigo, unspecified ear: Secondary | ICD-10-CM | POA: Diagnosis not present

## 2023-04-20 NOTE — Progress Notes (Signed)
Subjective:    Patient ID: Andrea [name], female    DOB: [birth year], 52 y.o.   MRN: [id number]  Patient is here today for hospital discharge follow-up.  Saturday went to the emergency room.  She woke up with severe vertigo.  If she laid down, the room began to spin.  She sat up the room began to spin.  She also had some heaviness in her chest and a racing heartbeat so she went to the emergency room.  In the emergency room, the patient had a negative CTA of the brain.  EKG showed diffuse ST segment depression which is a chronic finding for this patient.  They recommended outpatient cardiology evaluation.  They treated the patient for vertigo.  She is here today for follow-up.  She continues to endorse vertigo but the symptoms are consistent with BPPV.  Vertigo only occurs if she lays down or sits up in bed.  If she holds still the vertigo stops.  The vertigo is triggered by head movement.  She denies any hearing loss or headache.  There is no other neurologic deficit.  Therefore I feel that she has benign paroxysmal positional vertigo.  She does however report an occasional heartbeat.  Review of her EKG shows the chronic ST segment depression however it also shows a very short PR interval.  There is no discernible delta wave.  She reports that the racing heartbeat is seconds to minutes in duration.  She denies any syncope.  The patient had a normal coronary CT in 2019 with no plaque.  The Past Medical History:  Diagnosis Date   Abnormal uterine bleeding    Pt follows with Ellison Hughs, MD. She is scheduled for a hysterectomy on 09/07/22.   Angiolipoma of kidney    around 8 years ago per pt on 08/29/22, benign   Anxiety    follows w/ PCP Dr. Tanya Nones, LOV 07/07/22   Arthritis    "hands" (09/02/2013), neck   Asthma    seasonal, follows with PCP, Dr. Tanya Nones   B12 deficiency    Cervical polyp 2023   Epicondylitis, lateral    Right   Fibromyalgia    sees PCP, Dr. Tanya Nones, LOV 07/07/22 in Epic    GERD (gastroesophageal reflux disease)    History of recurrent UTIs    Hx of multiple UTIs. As of 08/23/22, most recent UTI on 08/16/22 treated with Macrobid 100 mg bid x 7 days. Urine culture negative. See OV notes from Kurtis Bushman, FNP in [place], dated 08/16/22 & 08/18/22.   IBS (irritable bowel syndrome)    .   Migraine headache    Pt takes Emgality monthly injections and follows with PCP, Dr. Lynnea Ferrier @ [facility].   Palpitations 06/2022   Hx of heart palpitations, PVCs. Pt had increased heart palpitations in 06/2022 during a urinary tract infection and fever. 06/13/22 EKG in Epic.   Past Surgical History:  Procedure Laterality Date   APPENDECTOMY  12/11/1982   CATARACT EXTRACTION Bilateral 2017   COLONOSCOPY  06/05/2022   One 7 mm sessile sigmoid polyp   CYSTOSCOPY N/A 09/07/2022   Procedure: CYSTOSCOPY;  Surgeon: Carlisle Cater, MD;  Location: [facility];  Service: Gynecology;  Laterality: N/A;   HYSTEROSCOPY  11/2021   LAPAROSCOPIC BILATERAL SALPINGECTOMY Bilateral 09/07/2022   Procedure: LAPAROSCOPIC BILATERAL SALPINGECTOMY;  Surgeon: Carlisle Cater, MD;  Location: [facility];  Service: Gynecology;  Laterality: Bilateral;   LAPAROSCOPIC SUPRACERVICAL HYSTERECTOMY N/A 09/07/2022  Procedure: ABORTED TOTAL LAPAROSCOPIC HYSTERECTOMY / laparoscopic assisted vaginal hysterectomy;  Surgeon: Carlisle Cater, MD;  Location: [facility];  Service: Gynecology;  Laterality: N/A;   PALATAL EXPANSION  12/11/2002   REDUCTION MAMMAPLASTY Bilateral ~ 2009   Current Outpatient Medications on File Prior to Visit  Medication Sig Dispense Refill   predniSONE (DELTASONE) 20 MG tablet 3 tabs poqday 1-2, 2 tabs poqday 3-4, 1 tab poqday 5-6 12 tablet 0   albuterol (VENTOLIN HFA) 108 (90 Base) MCG/ACT inhaler INHALE 2 PUFFS INTO THE LUNGS EVERY 6 HOURS AS NEEDED FOR WHEEZE OR SHORTNESS OF BREATH 8.5 each 2   ALPRAZolam (XANAX) 0.5 MG  tablet TAKE 1 TABLET BY MOUTH TWICE A DAY AS NEEDED FOR ANXIETY 30 tablet 0   celecoxib (CELEBREX) 200 MG capsule Take 200 mg by mouth daily as needed for mild pain. Rarely takes (Patient not taking: Reported on 03/27/2023)     cyclobenzaprine (FLEXERIL) 10 MG tablet TAKE 1 TABLET BY MOUTH EVERY 8 HOURS AS NEEDED FOR MUSCLE SPASMS 60 tablet 0   docusate sodium (COLACE) 100 MG capsule Take 200 mg by mouth daily as needed for mild constipation.     EMGALITY 120 MG/ML SOAJ SMARTSIG:120 Milligram(s) SUB-Q Once a Month     Erenumab-aooe (AIMOVIG) 140 MG/ML SOAJ Inject 140 mg into the skin every 30 (thirty) days. (Patient not taking: Reported on 03/27/2023) 1.12 mL 3   linaclotide (LINZESS) 290 MCG CAPS capsule TAKE 1 CAPSULE BY MOUTH DAILY BEFORE BREAKFAST. 30 capsule 1   meclizine (ANTIVERT) 25 MG tablet Take 1 tablet (25 mg total) by mouth 3 (three) times daily as needed for dizziness. 30 tablet 0   ondansetron (ZOFRAN-ODT) 4 MG disintegrating tablet Take 1 tablet (4 mg total) by mouth every 8 (eight) hours as needed for nausea or vomiting. 20 tablet 0   promethazine (PHENERGAN) 25 MG tablet TAKE 1 TABELT BY MOUTH EVERY 4 HOURS (Patient taking differently: every 4 (four) hours as needed. TAKE 1 TABELT BY MOUTH EVERY 4 HOURS) 20 tablet 1   sodium chloride (OCEAN) 0.65 % SOLN nasal spray Place 1 spray into both nostrils as needed for congestion.     Current Facility-Administered Medications on File Prior to Visit  Medication Dose Route Frequency Provider Last Rate Last Admin   betamethasone acetate-betamethasone sodium phosphate (CELESTONE) injection 3 mg  3 mg Intra-articular Once Felecia Shelling, DPM       Allergies  Allergen Reactions   Cefdinir Hives   Prednisone Rash     Only when takes in large doses   Macrolides And Ketolides     Headache,shortness of breath, heavy chest   Ceftin [Cefuroxime Axetil] Rash   Sulfa Antibiotics Rash   Social History   Socioeconomic History   Marital status:  Married    Spouse name: Not on file   Number of children: Not on file   Years of education: Not on file   Highest education level: Associate degree: occupational, Scientist, product/process development, or vocational program  Occupational History   Occupation: Midwife  Tobacco Use   Smoking status: Never   Smokeless tobacco: Never  Vaping Use   Vaping Use: Never used  Substance and Sexual Activity   Alcohol use: No   Drug use: No   Sexual activity: Not on file  Other Topics Concern   Not on file  Social History Narrative   Not on file   Social Determinants of Health   Financial Resource Strain: Low Risk  (  03/23/2023)   Overall Financial Resource Strain (CARDIA)    Difficulty of Paying Living Expenses: Not hard at all  Food Insecurity: No Food Insecurity (03/23/2023)   Hunger Vital Sign    Worried About Running Out of Food in the Last Year: Never true    Ran Out of Food in the Last Year: Never true  Transportation Needs: No Transportation Needs (03/23/2023)   PRAPARE - Administrator, Civil Service (Medical): No    Lack of Transportation (Non-Medical): No  Physical Activity: Unknown (03/23/2023)   Exercise Vital Sign    Days of Exercise per Week: Patient declined    Minutes of Exercise per Session: Not on file  Stress: Stress Concern Present (03/23/2023)   [employer] of Occupational Health - Occupational Stress Questionnaire    Feeling of Stress : To some extent  Social Connections: Unknown (03/23/2023)   Social Connection and Isolation Panel [NHANES]    Frequency of Communication with Friends and Family: More than three times a week    Frequency of Social Gatherings with Friends and Family: Three times a week    Attends Religious Services: Patient declined    Active Member of Clubs or Organizations: Patient declined    Attends Banker Meetings: Not on file    Marital Status: Married  Catering manager Violence: Not on file      Review of Systems  All  other systems reviewed and are negative.      Objective:   Physical Exam Vitals reviewed.  Constitutional:      General: She is not in acute distress.    Appearance: She is normal weight. She is not ill-appearing, toxic-appearing or diaphoretic.  Cardiovascular:     Rate and Rhythm: Normal rate and regular rhythm.     Heart sounds: Normal heart sounds. No murmur heard.    No friction rub. No gallop.  Pulmonary:     Effort: Pulmonary effort is normal. No respiratory distress.     Breath sounds: Normal breath sounds. No wheezing or rales.  Abdominal:     General: Abdomen is flat. Bowel sounds are normal.  Neurological:     Mental Status: She is alert.           Assessment & Plan:  Palpitations - Plan: LONG TERM MONITOR (3-14 DAYS)  Benign paroxysmal positional vertigo, unspecified laterality Given abnormal EKG, I will schedule the patient for a 14-day Zio patch monitor.  I want to ensure that the patient is not experiencing SVT or some other significant arrhythmia.  I do not feel that she requires a workup for cardiac ischemia given the negative coronary CT in 2019.  EKG changes have been present for quite some time.  I believe the vertigo is BPPV.  Recommended tincture of time.  I anticipate that this will improve over the next 2 weeks

## 2023-04-20 NOTE — Progress Notes (Unsigned)
Enrolled patient for a 14 day Zio XT monitor to be mailed to patients home  DOD to read 

## 2023-04-21 ENCOUNTER — Other Ambulatory Visit: Payer: Self-pay | Admitting: Family Medicine

## 2023-04-23 NOTE — Telephone Encounter (Signed)
Requested medication (s) are due for refill today: routing for review  Requested medication (s) are on the active medication list: yes  Last refill:  04/05/23  Future visit scheduled: no  Notes to clinic:  Unable to refill per protocol, cannot delegate.      Requested Prescriptions  Pending Prescriptions Disp Refills   cyclobenzaprine (FLEXERIL) 10 MG tablet [Pharmacy Med Name: CYCLOBENZAPRINE 10 MG TABLET] 60 tablet 0    Sig: TAKE 1 TABLET BY MOUTH EVERY 8 HOURS AS NEEDED FOR MUSCLE SPASM     Not Delegated - Analgesics:  Muscle Relaxants Failed - 04/21/2023  9:21 AM      Failed - This refill cannot be delegated      Failed - Valid encounter within last 6 months    Recent Outpatient Visits           1 year ago Fatigue, unspecified type   [facility] Donita Brooks, MD   1 year ago Palpitations   [facility] Valentino Nose, NP   1 year ago Bronchitis   [facility] Valentino Nose, NP   2 years ago Neck pain   [facility] Tanya Nones, Priscille Heidelberg, MD   2 years ago Fibromyalgia   [facility] Pickard, Priscille Heidelberg, MD       Future Appointments             In 5 months Pollyann Savoy, MD [facility]

## 2023-04-24 DIAGNOSIS — R002 Palpitations: Secondary | ICD-10-CM

## 2023-05-03 ENCOUNTER — Other Ambulatory Visit: Payer: Self-pay | Admitting: Family Medicine

## 2023-05-04 MED ORDER — ALPRAZOLAM 0.5 MG PO TABS: 0.5000 mg | ORAL_TABLET | Freq: Three times a day (TID) | ORAL | 0 refills | Status: DC | PRN

## 2023-05-19 DIAGNOSIS — J069 Acute upper respiratory infection, unspecified: Secondary | ICD-10-CM

## 2023-05-19 NOTE — ED Provider Notes (Signed)
[facility] EMERGENCY DEPT  EMERGENCY DEPARTMENT HISTORY AND PHYSICAL EXAM      Date: 05/19/2023  Patient Name: Leah Ruiz  MRN: [id number]  Birthdate 1971-04-19  Date of evaluation: 05/19/2023  Provider: Dr. Unknown Jim, MD  Note Started: 9:39 PM EDT 05/19/23    HISTORY OF PRESENT ILLNESS     Chief Complaint   Patient presents with    Cough    Pharyngitis    Fatigue       History Provided By: Patient    HPI: CARY WILFORD is a 52 y.o. female.  Patient presents with complaint of intermittent episode of nonproductive cough for last 4 days along with sore throat with subjective fever.  No headache or body ache.  Patient complains generalized fatigue with loss of sense of taste and smell.  No vomiting or diarrhea.  No obvious sick contact.  No OTC treatment.  No history hematuria.  No chest pain or shortness of breath        PAST MEDICAL HISTORY   Past Medical History:  Past Medical History:   Diagnosis Date    Hypertension        Past Surgical History:  Past Surgical History:   Procedure Laterality Date    TUBAL LIGATION         Family History:  History reviewed. No pertinent family history.    Social History:  Social History     Tobacco Use    Smoking status: Never    Smokeless tobacco: Never   Substance Use Topics    Alcohol use: Yes     Comment: occasionally    Drug use: Never       Allergies:  No Known Allergies    PCP: Mohiuddin, Abdul, MD    Current Meds:   Current Facility-Administered Medications   Medication Dose Route Frequency Provider Last Rate Last Admin    acetaminophen (TYLENOL) tablet 1,000 mg  1,000 mg Oral NOW Adana Marik P, MD        ibuprofen (ADVIL;MOTRIN) tablet 800 mg  800 mg Oral NOW Deirdre Gryder P, MD        predniSONE (DELTASONE) tablet 60 mg  60 mg Oral NOW Rosalin Hawking, MD         Current Outpatient Medications   Medication Sig Dispense Refill    predniSONE (DELTASONE) 20 MG tablet Take 3 tablets once a day for 2 days, then take 2 tablets once a day for 2 days, then take 1 take once a day for 2  days 12 tablet 1    guaiFENesin (MUCINEX) 600 MG extended release tablet Take 2 tablets by mouth 2 times daily for 15 days 30 tablet 0    azithromycin (ZITHROMAX Z-PAK) 250 MG tablet Take 2 tablets (500 mg) on Day 1, and then take 1 tablet (250 mg) on days 2 through 5. 1 packet 0    ibuprofen (ADVIL) 200 MG tablet Take 2 tablets by mouth every 8 hours as needed for Pain 20 tablet 0    acetaminophen (TYLENOL) 500 MG tablet Take 2 tablets by mouth every 8 hours as needed for Fever or Pain 360 tablet 1    amLODIPine (NORVASC) 5 MG tablet       losartan-hydroCHLOROthiazide (HYZAAR) 100-12.5 MG per tablet       ketorolac (TORADOL) 10 MG tablet Take 1 tablet by mouth every 6 hours as needed for Pain 20 tablet 0       Social Determinants of Health:  Social Determinants of Health     Tobacco Use: Low Risk  (05/19/2023)    Patient History     Smoking Tobacco Use: Never     Smokeless Tobacco Use: Never     Passive Exposure: Not on file   Alcohol Use: Not At Risk (10/15/2022)    AUDIT-C     Frequency of Alcohol Consumption: Never     Average Number of Drinks: Patient does not drink     Frequency of Binge Drinking: Never   Financial Resource Strain: Not on file   Food Insecurity: Not on file   Transportation Needs: Not on file   Physical Activity: Not on file   Stress: Not on file   Social Connections: Not on file   Intimate Partner Violence: Not on file   Depression: Not on file   Housing Stability: Not on file   Interpersonal Safety: Not At Risk (06/18/2022)    Interpersonal Safety Domain Source: IP Abuse Screening     Physical abuse: Denies     Verbal abuse: Denies     Emotional abuse: Denies     Financial abuse: Denies     Sexual abuse: Denies   Utilities: Not on file       PHYSICAL EXAM   Physical Exam  Vitals and nursing note reviewed.   Constitutional:       General: She is not in acute distress.     Appearance: Normal appearance. She is not ill-appearing, toxic-appearing or diaphoretic.   HENT:      Head: Normocephalic and  atraumatic.      Nose: No rhinorrhea.      Mouth/Throat:      Mouth: Mucous membranes are moist.      Pharynx: Posterior oropharyngeal erythema present.      Comments: Mild hoarseness of voice.  No stridor  Eyes:      General: No scleral icterus.     Conjunctiva/sclera: Conjunctivae normal.      Comments: No photophobia.   Cardiovascular:      Rate and Rhythm: Normal rate and regular rhythm.   Pulmonary:      Effort: Pulmonary effort is normal.      Breath sounds: Normal breath sounds. No wheezing, rhonchi or rales.   Abdominal:      General: Abdomen is flat. Bowel sounds are normal.      Palpations: Abdomen is soft.   Musculoskeletal:      Cervical back: Neck supple. No rigidity or tenderness.      Right lower leg: No edema.      Left lower leg: No edema.   Skin:     General: Skin is warm and dry.   Neurological:      General: No focal deficit present.      Mental Status: She is alert and oriented to person, place, and time.   Psychiatric:         Mood and Affect: Mood normal.         Behavior: Behavior normal.           SCREENINGS                   LAB, EKG AND DIAGNOSTIC RESULTS   Labs:  Recent Results (from the past 12 hour(s))   COVID-19 & Influenza Combo    Collection Time: 05/19/23  9:01 PM    Specimen: Nasopharyngeal   Result Value Ref Range    SARS-CoV-2, PCR Not Detected Not Detected  Rapid Influenza A By PCR Not Detected Not Detected      Rapid Influenza B By PCR Not Detected Not Detected             ED COURSE and DIFFERENTIAL DIAGNOSIS/MDM   9:42 PM Differential and Considerations:     Differential diagnosis that were considered include: URI/pharyngitis/laryngitis/COVID/flu    Patient present with symptoms/signs suggestive of URI with mild laryngitis.  Alert nontoxic-appearing.  Well-hydrated.  No respiratory distress.  No meningeal signs.  Lung sounds clear to auscultation without wheeze or rhonchi.  O2 saturation 99% on room air.  COVID and influenza were negative.  Patient discharged with  symptomatic treatment of URI with a course of prednisone/Mucinex/Tylenol/ibuprofen and a course of Zithromax.  Return cautions given.  Adequate hydration recommended            Records Reviewed (source and summary of external notes): Prior medical records and Nursing notes.    Vitals:    Vitals:    05/19/23 2057   BP: (!) 158/101   Pulse: 92   Resp: 18   Temp: 99.5 F (37.5 C)   TempSrc: Oral   SpO2: 99%   Weight: (!) 145.2 kg (320 lb)   Height: 1.829 m (6')        ED COURSE     Tolerated p.o. well without vomiting      Patient was given the following medications:  Medications   acetaminophen (TYLENOL) tablet 1,000 mg (has no administration in time range)   ibuprofen (ADVIL;MOTRIN) tablet 800 mg (has no administration in time range)   predniSONE (DELTASONE) tablet 60 mg (has no administration in time range)       CONSULTS: See ED Course/MDM for further details.            PROCEDURES   Unless otherwise noted above, none  Procedures      CRITICAL CARE TIME       ED IMPRESSION     1. Upper respiratory tract infection, unspecified type          DISPOSITION/PLAN   DISPOSITION Decision To Discharge 05/19/2023 09:38:21 PM    Discharge Note: The patient is stable for discharge home. The signs, symptoms, diagnosis, and discharge instructions have been discussed, understanding conveyed, and agreed upon. The patient is to follow up as recommended or return to ER should their symptoms worsen.      PATIENT REFERRED TO:  Your Primary Care doctor    Call in 1 day          DISCHARGE MEDICATIONS:     Medication List        START taking these medications      acetaminophen 500 MG tablet  Commonly known as: TYLENOL  Take 2 tablets by mouth every 8 hours as needed for Fever or Pain     azithromycin 250 MG tablet  Commonly known as: Zithromax Z-Pak  Take 2 tablets (500 mg) on Day 1, and then take 1 tablet (250 mg) on days 2 through 5.     guaiFENesin 600 MG extended release tablet  Commonly known as: MUCINEX  Take 2 tablets by mouth 2  times daily for 15 days     ibuprofen 200 MG tablet  Commonly known as: Advil  Take 2 tablets by mouth every 8 hours as needed for Pain     predniSONE 20 MG tablet  Commonly known as: DELTASONE  Take 3 tablets once a day for 2 days, then take 2 tablets  once a day for 2 days, then take 1 take once a day for 2 days            ASK your doctor about these medications      amLODIPine 5 MG tablet  Commonly known as: NORVASC     ketorolac 10 MG tablet  Commonly known as: TORADOL  Take 1 tablet by mouth every 6 hours as needed for Pain     losartan-hydroCHLOROthiazide 100-12.5 MG per tablet  Commonly known as: HYZAAR               Where to Get Your Medications        These medications were sent to [facility] - [place], [place] - [address] - P [phone] Carmon Ginsberg [phone]  [address], [place] [address]      Phone: [phone]   acetaminophen 500 MG tablet  azithromycin 250 MG tablet  guaiFENesin 600 MG extended release tablet  ibuprofen 200 MG tablet  predniSONE 20 MG tablet           DISCONTINUED MEDICATIONS:  Current Discharge Medication List          I am the Primary Clinician of Record. Alann Avey Theotis Barrio, MD (electronically signed)    (Please note that parts of this dictation were completed with voice recognition software. Quite often unanticipated grammatical, syntax, homophones, and other interpretive errors are inadvertently transcribed by the computer software. Please disregards these errors. Please excuse any errors that have escaped final proofreading.)     Rosalin Hawking, MD  05/19/23 2142

## 2023-05-19 NOTE — ED Triage Notes (Signed)
States she's been coughing since Tuesday with mouth soreness and ear aches. Also c/o weakness and nausea and no taste

## 2023-05-20 ENCOUNTER — Inpatient Hospital Stay
Admit: 2023-05-20 | Discharge: 2023-05-20 | Disposition: A | Discharge status: Home / Self Care | Payer: PRIVATE HEALTH INSURANCE | Attending: Emergency Medicine

## 2023-05-20 LAB — COVID-19 & INFLUENZA COMBO
Rapid Influenza A By PCR: NOT DETECTED
Rapid Influenza B By PCR: NOT DETECTED
SARS-CoV-2, PCR: NOT DETECTED

## 2023-05-20 MED ORDER — AZITHROMYCIN 250 MG PO TABS
250 | PACK | ORAL | 0 refills | Status: AC
Start: 2023-05-20 — End: ?

## 2023-05-20 MED ORDER — ACETAMINOPHEN 500 MG PO TABS
500 | ORAL_TABLET | Freq: Three times a day (TID) | ORAL | 1 refills | Status: AC | PRN
Start: 2023-05-20 — End: ?

## 2023-05-20 MED ORDER — IBUPROFEN 200 MG PO TABS
200 | ORAL_TABLET | Freq: Three times a day (TID) | ORAL | 0 refills | Status: AC | PRN
Start: 2023-05-20 — End: 2023-05-26

## 2023-05-20 MED ORDER — IBUPROFEN 800 MG PO TABS
800 | ORAL | Status: AC
Start: 2023-05-20 — End: 2023-05-19
  Administered 2023-05-20: 02:00:00 800 mg via ORAL

## 2023-05-20 MED ORDER — ACETAMINOPHEN 500 MG PO TABS
500 | ORAL | Status: AC
Start: 2023-05-20 — End: 2023-05-19
  Administered 2023-05-20: 02:00:00 1000 mg via ORAL

## 2023-05-20 MED ORDER — PREDNISONE 20 MG PO TABS
20 | ORAL | Status: AC
Start: 2023-05-20 — End: 2023-05-19
  Administered 2023-05-20: 02:00:00 60 mg via ORAL

## 2023-05-20 MED ORDER — PREDNISONE 20 MG PO TABS
20 | ORAL_TABLET | ORAL | 1 refills | Status: AC
Start: 2023-05-20 — End: ?

## 2023-05-20 MED ORDER — GUAIFENESIN ER 600 MG PO TB12
600 | ORAL_TABLET | Freq: Two times a day (BID) | ORAL | 0 refills | Status: AC
Start: 2023-05-20 — End: 2023-06-03

## 2023-05-20 MED FILL — IBUPROFEN 800 MG PO TABS: 800 MG | ORAL | Qty: 1

## 2023-05-20 MED FILL — PREDNISONE 20 MG PO TABS: 20 MG | ORAL | Qty: 3

## 2023-05-20 MED FILL — ACETAMINOPHEN EXTRA STRENGTH 500 MG PO TABS: 500 MG | ORAL | Qty: 2

## 2023-05-21 ENCOUNTER — Other Ambulatory Visit: Payer: Self-pay | Admitting: Family Medicine

## 2023-05-21 ENCOUNTER — Encounter: Payer: Self-pay | Admitting: Family Medicine

## 2023-05-21 MED ORDER — OXYCODONE-ACETAMINOPHEN 5-325 MG PO TABS: 1.0000 | ORAL_TABLET | ORAL | 0 refills | Status: AC | PRN

## 2023-05-22 ENCOUNTER — Telehealth: Payer: Self-pay

## 2023-05-22 NOTE — Telephone Encounter (Signed)
Pt called in to discuss results of recent test she had done. Please advise.  Cb#: [id number]

## 2023-05-23 NOTE — Progress Notes (Deleted)
Office Visit Note  Patient: Andrea [name]             Date of Birth: [birth year]           MRN: [id number]             PCP: Donita Brooks, MD Referring: Donita Brooks, MD Visit Date: 06/04/2023 Occupation: @GUAROCC @  Subjective:  No chief complaint on file.   History of Present Illness: Andrea [name] is a 52 y.o. female ***     Activities of Daily Living:  Patient reports morning stiffness for *** {minute/hour:19697}.   Patient {ACTIONS;DENIES/REPORTS:21021675::"Denies"} nocturnal pain.  Difficulty dressing/grooming: {ACTIONS;DENIES/REPORTS:21021675::"Denies"} Difficulty climbing stairs: {ACTIONS;DENIES/REPORTS:21021675::"Denies"} Difficulty getting out of chair: {ACTIONS;DENIES/REPORTS:21021675::"Denies"} Difficulty using hands for taps, buttons, cutlery, and/or writing: {ACTIONS;DENIES/REPORTS:21021675::"Denies"}  No Rheumatology ROS completed.   PMFS History:  Patient Active Problem List   Diagnosis Date Noted   Abnormal uterine bleeding (AUB) 09/07/2022   UTI (urinary tract infection) 08/15/2021   Hyperglycemia 08/15/2021   Class 2 obesity due to excess calories with body mass index (BMI) of 35.0 to 35.9 in adult 08/15/2021   Lateral epicondylitis of right elbow 08/26/2018   Trigger middle finger of right hand 08/26/2018   Irritable bowel syndrome 06/29/2018   Menstrual migraine 06/29/2018   Fibromyalgia 08/15/2017   Chest pain with low risk for cardiac etiology 09/02/2013   Family history of early CAD 09/02/2013   B12 deficiency     Past Medical History:  Diagnosis Date   Abnormal uterine bleeding    Pt follows with Ellison Hughs, MD. She is scheduled for a hysterectomy on 09/07/22.   Angiolipoma of kidney    around 8 years ago per pt on 08/29/22, benign   Anxiety    follows w/ PCP Dr. Tanya Nones, LOV 07/07/22   Arthritis    "hands" (09/02/2013), neck   Asthma    seasonal, follows with PCP, Dr. Tanya Nones   B12 deficiency    Cervical polyp 2023    Epicondylitis, lateral    Right   Fibromyalgia    sees PCP, Dr. Tanya Nones, LOV 07/07/22 in Epic   GERD (gastroesophageal reflux disease)    History of recurrent UTIs    Hx of multiple UTIs. As of 08/23/22, most recent UTI on 08/16/22 treated with Macrobid 100 mg bid x 7 days. Urine culture negative. See OV notes from Kurtis Bushman, FNP in [place], dated 08/16/22 & 08/18/22.   IBS (irritable bowel syndrome)    .   Migraine headache    Pt takes Emgality monthly injections and follows with PCP, Dr. Lynnea Ferrier @ [facility].   Palpitations 06/2022   Hx of heart palpitations, PVCs. Pt had increased heart palpitations in 06/2022 during a urinary tract infection and fever. 06/13/22 EKG in Epic.    Family History  Problem Relation Age of Onset   Coronary artery disease Father 15       MI   Heart attack Father    Diabetes Sister    Irregular heart beat Sister    Congestive Heart Failure Paternal Grandmother    Arrhythmia Paternal Uncle    Past Surgical History:  Procedure Laterality Date   APPENDECTOMY  12/11/1982   CATARACT EXTRACTION Bilateral 2017   COLONOSCOPY  06/05/2022   One 7 mm sessile sigmoid polyp   CYSTOSCOPY N/A 09/07/2022   Procedure: CYSTOSCOPY;  Surgeon: Carlisle Cater, MD;  Location: [facility];  Service: Gynecology;  Laterality: N/A;   HYSTEROSCOPY  11/2021  LAPAROSCOPIC BILATERAL SALPINGECTOMY Bilateral 09/07/2022   Procedure: LAPAROSCOPIC BILATERAL SALPINGECTOMY;  Surgeon: Carlisle Cater, MD;  Location: [facility];  Service: Gynecology;  Laterality: Bilateral;   LAPAROSCOPIC SUPRACERVICAL HYSTERECTOMY N/A 09/07/2022   Procedure: ABORTED TOTAL LAPAROSCOPIC HYSTERECTOMY / laparoscopic assisted vaginal hysterectomy;  Surgeon: Carlisle Cater, MD;  Location: [facility];  Service: Gynecology;  Laterality: N/A;   PALATAL EXPANSION  12/11/2002   REDUCTION MAMMAPLASTY Bilateral ~ 2009   Social History   Social  History Narrative   Not on file   Immunization History  Administered Date(s) Administered   Tdap 12/23/2021     Objective: Vital Signs: There were no vitals taken for this visit.   Physical Exam   Musculoskeletal Exam: ***  CDAI Exam: CDAI Score: -- Patient Global: --; Provider Global: -- Swollen: --; Tender: -- Joint Exam 06/04/2023   No joint exam has been documented for this visit   There is currently no information documented on the homunculus. Go to the Rheumatology activity and complete the homunculus joint exam.  Investigation: No additional findings.  Imaging: No results found.  Recent Labs: Lab Results  Component Value Date   WBC 9.8 04/14/2023   HGB 12.5 04/14/2023   PLT 324 04/14/2023   NA 137 04/14/2023   K 3.5 04/14/2023   CL 103 04/14/2023   CO2 24 04/14/2023   GLUCOSE 109 (H) 04/14/2023   BUN 17 04/14/2023   CREATININE 0.76 04/14/2023   BILITOT 0.2 03/27/2023   ALKPHOS 80 07/18/2022   AST 16 03/27/2023   ALT 16 03/27/2023   PROT 6.7 03/27/2023   ALBUMIN 4.4 07/18/2022   CALCIUM 9.2 04/14/2023   GFRAA 101 07/08/2019    Speciality Comments: No specialty comments available.  Procedures:  No procedures performed Allergies: Cefdinir, Prednisone, Macrolides and ketolides, Ceftin [cefuroxime axetil], and Sulfa antibiotics   Assessment / Plan:     Visit Diagnoses: Positive ANA (antinuclear antibody) - 03/27/23: ANA 1:160NH, CK 49, ESR 6, RF 12  Polyarthralgia  Trigger middle finger of right hand  Lateral epicondylitis of right elbow  Fibromyalgia  B12 deficiency  Family history of early CAD  Class 2 obesity due to excess calories without serious comorbidity with body mass index (BMI) of 35.0 to 35.9 in adult  History of IBS  Hx of migraine headaches  Orders: No orders of the defined types were placed in this encounter.  No orders of the defined types were placed in this encounter.   Face-to-face time spent with patient was  *** minutes. Greater than 50% of time was spent in counseling and coordination of care.  Follow-Up Instructions: No follow-ups on file.   Gearldine Bienenstock, PA-C  Note - This record has been created using Dragon software.  Chart creation errors have been sought, but may not always  have been located. Such creation errors do not reflect on  the standard of medical care.

## 2023-06-04 ENCOUNTER — Encounter: Payer: BC Managed Care – PPO | Admitting: Rheumatology

## 2023-06-04 ENCOUNTER — Other Ambulatory Visit: Payer: Self-pay | Admitting: Family Medicine

## 2023-06-04 DIAGNOSIS — E538 Deficiency of other specified B group vitamins: Secondary | ICD-10-CM

## 2023-06-04 DIAGNOSIS — M65331 Trigger finger, right middle finger: Secondary | ICD-10-CM

## 2023-06-04 DIAGNOSIS — R768 Other specified abnormal immunological findings in serum: Secondary | ICD-10-CM

## 2023-06-04 DIAGNOSIS — M2550 Pain in unspecified joint: Secondary | ICD-10-CM

## 2023-06-04 DIAGNOSIS — E6609 Other obesity due to excess calories: Secondary | ICD-10-CM

## 2023-06-04 DIAGNOSIS — M797 Fibromyalgia: Secondary | ICD-10-CM

## 2023-06-04 DIAGNOSIS — Z8669 Personal history of other diseases of the nervous system and sense organs: Secondary | ICD-10-CM

## 2023-06-04 DIAGNOSIS — Z8249 Family history of ischemic heart disease and other diseases of the circulatory system: Secondary | ICD-10-CM

## 2023-06-04 DIAGNOSIS — Z8719 Personal history of other diseases of the digestive system: Secondary | ICD-10-CM

## 2023-06-04 DIAGNOSIS — M7711 Lateral epicondylitis, right elbow: Secondary | ICD-10-CM

## 2023-06-07 ENCOUNTER — Telehealth: Payer: Self-pay

## 2023-06-07 NOTE — Telephone Encounter (Signed)
Prescription Request  06/07/2023  LOV: 04/20/23  What is the name of the medication or equipment? linaclotide (LINZESS) 290 MCG CAPS capsule [[id number]]  Have you contacted your pharmacy to request a refill? Yes   Which pharmacy would you like this sent to?  [id number] [place], [place] - [address] [place] [address] Phone: [phone] Fax: [phone]    Patient notified that their request is being sent to the clinical staff for review and that they should receive a response within 2 business days.   Please advise at [facility] [phone]

## 2023-06-08 ENCOUNTER — Other Ambulatory Visit: Payer: Self-pay

## 2023-06-08 DIAGNOSIS — K589 Irritable bowel syndrome without diarrhea: Secondary | ICD-10-CM

## 2023-06-08 MED ORDER — LINACLOTIDE 290 MCG PO CAPS
ORAL_CAPSULE | ORAL | 1 refills | Status: DC
Start: 2023-06-08 — End: 2024-09-22

## 2023-06-12 NOTE — Progress Notes (Signed)
Office Visit Note  Patient: Andrea [name]             Date of Birth: Nov 15, 1971           MRN: [id number]             PCP: Donita Brooks, MD Referring: Donita Brooks, MD Visit Date: 06/26/2023 Occupation: @GUAROCC @  Subjective:  Pain in multiple joints and muscles  History of Present Illness: Andrea [name] is a 52 y.o. female in consultation per request of her PCP.  According the patient her symptoms started in 2018 with pain and discomfort in multiple joints and muscles.  At that time she had extensive labs per patient which were unremarkable.  She was given the diagnosis of fibromyalgia syndrome.  She was placed on Cymbalta for fibromyalgia.  She states she took Cymbalta for many years but did not find any relief and weaned off the medication.  She was recently also given Celebrex without much help.  She is describes discomfort in her cervical spine.  She states she has had cervical spine x-rays in the past and was diagnosed with degenerative disc disease.  She goes to [facility] for joint pain.  She has been also told that she had tendinitis in her shoulders in the past.  She had shoulder joint injections which were helpful.  She has had recurrent epicondylitis in her elbows which is still bothersome.  She states for the last 3 months she has been having pain and discomfort in her left wrist for which she was seen at the hand center.  She had x-rays and an injection was given which helped.  She has been wearing a brace on her left wrist.  She states the pain has recurred.  She also has right middle and ring trigger fingers for which she has had cortisone injections there.  She has recurrent pain in the trochanteric area and has difficulty sleeping on her side.  She complains of left knee joint pain for the last 3 months.  She had a catching sensation about a week ago.  She was seen at the orthopedic place where she was given a brace after x-rays.  She was told that she may  require MRI if her symptoms persist.  She has been icing her knee and is feeling better.  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  She has been told that she has severe arthritis in her first toe which will require either reconstruction or fusion.  Her left second toe is also painful.  She notices swelling in her hands when she wakes up in the morning.  Patient said about 3 4 months ago she pulled a muscle in her neck which was causing a spasm send she was treated with prednisone taper.  Symptoms resolved after the prednisone taper.  She continues to have generalized achiness and myalgias.  There is no family history of autoimmune disease.  She has a cousin with fibromyalgia.  She is gravida 2 and para 2.  Status post hysterectomy.    Activities of Daily Living:  Patient reports morning stiffness for 1 hour.   Patient Reports nocturnal pain.  Difficulty dressing/grooming: Denies Difficulty climbing stairs: Reports Difficulty getting out of chair: Denies Difficulty using hands for taps, buttons, cutlery, and/or writing: Denies  Review of Systems  Constitutional:  Positive for fatigue.  HENT:  Negative for mouth sores and mouth dryness.   Eyes:  Positive for dryness.  Negative for pain and itching.  Respiratory:  Negative for shortness of breath.   Cardiovascular:  Negative for chest pain and palpitations.  Gastrointestinal:  Positive for constipation. Negative for blood in stool and diarrhea.  Endocrine: Negative for increased urination.  Genitourinary:  Negative for involuntary urination.  Musculoskeletal:  Positive for joint pain, gait problem, joint pain, joint swelling, myalgias, muscle weakness, morning stiffness, muscle tenderness and myalgias.  Skin:  Negative for color change, rash, hair loss and sensitivity to sunlight.  Allergic/Immunologic: Negative for susceptible to infections.  Neurological:  Positive for dizziness and headaches.  Hematological:   Negative for swollen glands.  Psychiatric/Behavioral:  Positive for sleep disturbance. Negative for depressed mood. The patient is not nervous/anxious.     PMFS History:  Patient Active Problem List   Diagnosis Date Noted   Abnormal uterine bleeding (AUB) 09/07/2022   UTI (urinary tract infection) 08/15/2021   Hyperglycemia 08/15/2021   Class 2 obesity due to excess calories with body mass index (BMI) of 35.0 to 35.9 in adult 08/15/2021   Lateral epicondylitis of right elbow 08/26/2018   Trigger middle finger of right hand 08/26/2018   Irritable bowel syndrome 06/29/2018   Menstrual migraine 06/29/2018   Fibromyalgia 08/15/2017   Chest pain with low risk for cardiac etiology 09/02/2013   Family history of early CAD 09/02/2013   B12 deficiency     Past Medical History:  Diagnosis Date   Abnormal uterine bleeding    Pt follows with Ellison Hughs, MD. She is scheduled for a hysterectomy on 09/07/22.   Angiolipoma of kidney    around 8 years ago per pt on 08/29/22, benign   Anxiety    follows w/ PCP Dr. Tanya Nones, LOV 07/07/22   Arthritis    "hands" (09/02/2013), neck   Asthma    seasonal, follows with PCP, Dr. Tanya Nones   B12 deficiency    Cervical polyp 2023   Epicondylitis, lateral    Right   Fibromyalgia    sees PCP, Dr. Tanya Nones, LOV 07/07/22 in Epic   GERD (gastroesophageal reflux disease)    History of recurrent UTIs    Hx of multiple UTIs. As of 08/23/22, most recent UTI on 08/16/22 treated with Macrobid 100 mg bid x 7 days. Urine culture negative. See OV notes from Kurtis Bushman, FNP in [place], dated 08/16/22 & 08/18/22.   IBS (irritable bowel syndrome)    .   Migraine headache    Pt takes Emgality monthly injections and follows with PCP, Dr. Lynnea Ferrier @ [facility].   Palpitations 06/2022   Hx of heart palpitations, PVCs. Pt had increased heart palpitations in 06/2022 during a urinary tract infection and fever. 06/13/22 EKG in Epic.    Family History  Problem  Relation Age of Onset   COPD Mother    Lung cancer Mother    Coronary artery disease Father 48       MI   Heart attack Father    Alcoholism Father    Cirrhosis Father    Diabetes Sister    Irregular heart beat Sister    Arrhythmia Paternal Uncle    Congestive Heart Failure Paternal Grandmother    Healthy Daughter    Healthy Daughter    Past Surgical History:  Procedure Laterality Date   APPENDECTOMY  12/11/1982   CATARACT EXTRACTION Bilateral 2017   COLONOSCOPY  06/05/2022   One 7 mm sessile sigmoid polyp   CYSTOSCOPY N/A 09/07/2022   Procedure: CYSTOSCOPY;  Surgeon: Ellison Hughs  M, MD;  Location: [facility];  Service: Gynecology;  Laterality: N/A;   HYSTEROSCOPY  11/2021   LAPAROSCOPIC BILATERAL SALPINGECTOMY Bilateral 09/07/2022   Procedure: LAPAROSCOPIC BILATERAL SALPINGECTOMY;  Surgeon: Carlisle Cater, MD;  Location: [facility];  Service: Gynecology;  Laterality: Bilateral;   LAPAROSCOPIC SUPRACERVICAL HYSTERECTOMY N/A 09/07/2022   Procedure: ABORTED TOTAL LAPAROSCOPIC HYSTERECTOMY / laparoscopic assisted vaginal hysterectomy;  Surgeon: Carlisle Cater, MD;  Location: [facility];  Service: Gynecology;  Laterality: N/A;   PALATAL EXPANSION  12/11/2002   REDUCTION MAMMAPLASTY Bilateral ~ 2009   Social History   Social History Narrative   Not on file   Immunization History  Administered Date(s) Administered   Tdap 12/23/2021     Objective: Vital Signs: BP (!) 130/90 (BP Location: Right Arm, Patient Position: Sitting, Cuff Size: Normal)   Pulse 81   Resp 15   Ht 5' 0.5" (1.537 m)   Wt 179 lb (81.2 kg)   LMP  (LMP Unknown)   BMI 34.38 kg/m    Physical Exam Vitals and nursing note reviewed.  Constitutional:      Appearance: She is well-developed.  HENT:     Head: Normocephalic and atraumatic.  Eyes:     Conjunctiva/sclera: Conjunctivae normal.  Cardiovascular:     Rate and Rhythm: Normal rate and regular  rhythm.     Heart sounds: Normal heart sounds.  Pulmonary:     Effort: Pulmonary effort is normal.     Breath sounds: Normal breath sounds.  Abdominal:     General: Bowel sounds are normal.     Palpations: Abdomen is soft.  Musculoskeletal:     Cervical back: Normal range of motion.  Lymphadenopathy:     Cervical: No cervical adenopathy.  Skin:    General: Skin is warm and dry.     Capillary Refill: Capillary refill takes less than 2 seconds.  Neurological:     Mental Status: She is alert and oriented to person, place, and time.  Psychiatric:        Behavior: Behavior normal.      Musculoskeletal Exam: Cervical spine was in good motion.  She had bilateral trapezius spasm.  Thoracic and lumbar spine were in good range of motion without any tenderness.  There was no SI joint tenderness.  Shoulder joints, elbow joints, wrist joints, MCPs PIPs and DIPs were in good range of motion.  No synovitis was noted.  Flexor tendon thickening of her right middle and ring finger was noted.  Hip joints and knee joints were in good range of motion.  No warmth swelling or effusion was noted.  She had discomfort range of motion of her left knee joint.  Ankle joints were in good range of motion.  She had thickening of her left great toe with limited range of motion.  No synovitis was noted.  CDAI Exam: CDAI Score: -- Patient Global: --; Provider Global: -- Swollen: --; Tender: -- Joint Exam 06/26/2023   No joint exam has been documented for this visit   There is currently no information documented on the homunculus. Go to the Rheumatology activity and complete the homunculus joint exam.  Investigation: No additional findings.  Imaging: No results found.  Recent Labs: Lab Results  Component Value Date   WBC 9.8 04/14/2023   HGB 12.5 04/14/2023   PLT 324 04/14/2023   NA 137 04/14/2023   K 3.5 04/14/2023   CL 103 04/14/2023   CO2 24 04/14/2023  GLUCOSE 109 (H) 04/14/2023   BUN 17  04/14/2023   CREATININE 0.76 04/14/2023   BILITOT 0.2 03/27/2023   ALKPHOS 80 07/18/2022   AST 16 03/27/2023   ALT 16 03/27/2023   PROT 6.7 03/27/2023   ALBUMIN 4.4 07/18/2022   CALCIUM 9.2 04/14/2023   GFRAA 101 07/08/2019   March 27, 2023 ANA 1: 160NH, ESR 6, RF negative, CK 49  Speciality Comments: No specialty comments available.  Procedures:  No procedures performed Allergies: Cefdinir, Prednisone, Macrolides and ketolides, Ceftin [cefuroxime axetil], and Sulfa antibiotics   Assessment / Plan:     Visit Diagnoses: Positive ANA (antinuclear antibody) -patient is low titer positive ANA.  She denies any history of oral ulcers, nasal ulcers, malar rash, sicca symptoms, photosensitivity, Raynaud's or lymphadenopathy.  She complains of fatigue and arthralgias.  I will obtain additional labs today.  Plan: Protein / creatinine ratio, urine, ANA, Anti-scleroderma antibody, RNP Antibody, Anti-Smith antibody, Sjogrens syndrome-A extractable nuclear antibody, Sjogrens syndrome-B extractable nuclear antibody, Anti-DNA antibody, double-stranded, C3 and C4, Beta-2 glycoprotein antibodies, Cardiolipin antibodies, IgG, IgM, IgA  Polyarthralgia-she gives history of pain in multiple joints and muscles since 2018.  She notices puffiness in her hands in the morning.  No synovitis was noted on the examination today.  Pain in both hands -she complains of pain and discomfort in her bilateral hands and stiffness.  No synovitis was noted.  Plan: XR Hand 2 View Right, XR Hand 2 View Left, Cyclic citrul peptide antibody, IgG  Lateral epicondylitis of right elbow-she has off-and-on discomfort in the lateral epicondyle region.  She had no tenderness on the examination today.  Trigger middle finger of right hand-patient has been evaluated at the hand center.  Patient had cortisone injections x 3.  She still has some triggering.  Thickening of the flexor tendon was noted.  Trigger finger, right ring finger-she  has had cortisone injections with recurrent symptoms.  She has thickening of the flexor tendon.  Trochanteric bursitis of both hips-she is off-and-on discomfort in bilateral trochanteric bursa and has nocturnal pain.  IT band stretches were advised.  Left knee pain-patient states she has experiencing discomfort in her left knee joint for the last few weeks.  The pain has been worse in the last month.  She has been using a brace as recommended by her orthopedic surgeon.  No warmth swelling or effusion was noted.  Other fatigue -she gives history of chronic fatigue most likely related to fibromyalgia syndrome.  Plan: TSH  Fibromyalgia-she was diagnosed with fibromyalgia in 2018.  History of generalized pain and discomfort.  She also reports fatigue.  She has tried Cymbalta and Celebrex without much relief.  She takes Flexeril which is helpful.  Benefits of water aerobics and summing were discussed.  Other irritable bowel syndrome-she reports chronic constipation.  B12 deficiency  Family history of early CAD  Orders: Orders Placed This Encounter  Procedures   XR Hand 2 View Right   XR Hand 2 View Left   Protein / creatinine ratio, urine   Cyclic citrul peptide antibody, IgG   ANA   Anti-scleroderma antibody   RNP Antibody   Anti-Smith antibody   Sjogrens syndrome-A extractable nuclear antibody   Sjogrens syndrome-B extractable nuclear antibody   Anti-DNA antibody, double-stranded   C3 and C4   Beta-2 glycoprotein antibodies   Cardiolipin antibodies, IgG, IgM, IgA   TSH   No orders of the defined types were placed in this encounter.    Follow-Up Instructions: Return for +  ANA.   Pollyann Savoy, MD  Note - This record has been created using Animal nutritionist.  Chart creation errors have been sought, but may not always  have been located. Such creation errors do not reflect on  the standard of medical care.

## 2023-06-15 ENCOUNTER — Other Ambulatory Visit: Payer: Self-pay | Admitting: Family Medicine

## 2023-06-15 NOTE — Telephone Encounter (Signed)
Requested medication (s) are due for refill today: yes  Requested medication (s) are on the active medication list: yes  Last refill:  04/23/23#60/0  Future visit scheduled: no  Notes to clinic:  Unable to refill per protocol, cannot delegate.     Requested Prescriptions  Pending Prescriptions Disp Refills   cyclobenzaprine (FLEXERIL) 10 MG tablet [Pharmacy Med Name: CYCLOBENZAPRINE 10 MG TABLET] 60 tablet 0    Sig: TAKE 1 TABLET BY MOUTH EVERY 8 HOURS AS NEEDED FOR MUSCLE SPASMS     Not Delegated - Analgesics:  Muscle Relaxants Failed - 06/15/2023 10:00 AM      Failed - This refill cannot be delegated      Failed - Valid encounter within last 6 months    Recent Outpatient Visits           1 year ago Fatigue, unspecified type   [facility] Donita Brooks, MD   2 years ago Palpitations   [facility] Valentino Nose, NP   2 years ago Bronchitis   [facility] Valentino Nose, NP   2 years ago Neck pain   [facility] Tanya Nones, Priscille Heidelberg, MD   2 years ago Fibromyalgia   [facility] Pick[place], Priscille Heidelberg, MD

## 2023-06-18 ENCOUNTER — Other Ambulatory Visit: Payer: Self-pay

## 2023-06-18 DIAGNOSIS — G43829 Menstrual migraine, not intractable, without status migrainosus: Secondary | ICD-10-CM

## 2023-06-18 MED ORDER — EMGALITY 120 MG/ML ~~LOC~~ SOAJ
SUBCUTANEOUS | 3 refills | Status: DC
Start: 2023-06-18 — End: 2023-10-29

## 2023-06-26 ENCOUNTER — Encounter: Payer: Self-pay | Admitting: Rheumatology

## 2023-06-26 ENCOUNTER — Ambulatory Visit: Payer: BC Managed Care – PPO | Attending: Rheumatology | Admitting: Rheumatology

## 2023-06-26 ENCOUNTER — Ambulatory Visit (INDEPENDENT_AMBULATORY_CARE_PROVIDER_SITE_OTHER): Payer: BC Managed Care – PPO

## 2023-06-26 ENCOUNTER — Ambulatory Visit: Payer: BC Managed Care – PPO

## 2023-06-26 VITALS — BP 130/90 | HR 81 | Resp 15 | Ht 60.5 in | Wt 179.0 lb

## 2023-06-26 DIAGNOSIS — G43829 Menstrual migraine, not intractable, without status migrainosus: Secondary | ICD-10-CM

## 2023-06-26 DIAGNOSIS — M797 Fibromyalgia: Secondary | ICD-10-CM

## 2023-06-26 DIAGNOSIS — M79641 Pain in right hand: Secondary | ICD-10-CM

## 2023-06-26 DIAGNOSIS — R768 Other specified abnormal immunological findings in serum: Secondary | ICD-10-CM

## 2023-06-26 DIAGNOSIS — M65341 Trigger finger, right ring finger: Secondary | ICD-10-CM

## 2023-06-26 DIAGNOSIS — M2550 Pain in unspecified joint: Secondary | ICD-10-CM

## 2023-06-26 DIAGNOSIS — M7711 Lateral epicondylitis, right elbow: Secondary | ICD-10-CM | POA: Diagnosis not present

## 2023-06-26 DIAGNOSIS — R5383 Other fatigue: Secondary | ICD-10-CM

## 2023-06-26 DIAGNOSIS — M25562 Pain in left knee: Secondary | ICD-10-CM

## 2023-06-26 DIAGNOSIS — R7689 Other specified abnormal immunological findings in serum: Secondary | ICD-10-CM

## 2023-06-26 DIAGNOSIS — M65331 Trigger finger, right middle finger: Secondary | ICD-10-CM

## 2023-06-26 DIAGNOSIS — M79642 Pain in left hand: Secondary | ICD-10-CM

## 2023-06-26 DIAGNOSIS — G8929 Other chronic pain: Secondary | ICD-10-CM

## 2023-06-26 DIAGNOSIS — M7061 Trochanteric bursitis, right hip: Secondary | ICD-10-CM

## 2023-06-26 DIAGNOSIS — M7062 Trochanteric bursitis, left hip: Secondary | ICD-10-CM

## 2023-06-26 DIAGNOSIS — Z8249 Family history of ischemic heart disease and other diseases of the circulatory system: Secondary | ICD-10-CM

## 2023-06-26 DIAGNOSIS — K588 Other irritable bowel syndrome: Secondary | ICD-10-CM

## 2023-06-26 DIAGNOSIS — E538 Deficiency of other specified B group vitamins: Secondary | ICD-10-CM

## 2023-06-27 NOTE — Progress Notes (Signed)
TSH is high.  Please forward results to her PCP.  We will contact her when all the other results are available.

## 2023-06-27 NOTE — Progress Notes (Signed)
Office Visit Note  Patient: Andrea [name]             Date of Birth: [birth year]           MRN: [id number]             PCP: Donita Brooks, MD Referring: Donita Brooks, MD Visit Date: 07/11/2023 Occupation: @GUAROCC @  Subjective:  Pain in multiple joint and muscles  History of Present Illness: Andrea [name] is a 52 y.o. female returns today after her initial visit to on June 26, 2023.  She was evaluated for positive ANA and joint pain.  She continues to have some pain and stiffness in her bilateral hands send intermittent discomfort in elbows and hips.  She has not noticed any joint swelling.  She denies any history of oral ulcers, nasal ulcers, malar rash, photosensitivity, Raynaud's, inflammatory arthritis.  Patient states she had UTI about 2 weeks ago which was treated with antibiotics and it resolved.    Activities of Daily Living:  Patient reports morning stiffness for 1 hour.   Patient Reports nocturnal pain.  Difficulty dressing/grooming: Denies Difficulty climbing stairs: Denies Difficulty getting out of chair: Denies Difficulty using hands for taps, buttons, cutlery, and/or writing: Denies  Review of Systems  Constitutional:  Positive for fatigue.  HENT:  Negative for mouth sores and mouth dryness.   Eyes:  Positive for dryness.  Respiratory:  Negative for shortness of breath.   Cardiovascular:  Positive for palpitations. Negative for chest pain.  Gastrointestinal:  Negative for blood in stool, constipation and diarrhea.  Endocrine: Negative for increased urination.  Genitourinary:  Negative for painful urination and involuntary urination.  Musculoskeletal:  Positive for joint pain, gait problem, joint pain, joint swelling, myalgias, muscle weakness, morning stiffness, muscle tenderness and myalgias.  Skin:  Negative for color change, rash, hair loss and sensitivity to sunlight.  Allergic/Immunologic: Negative for susceptible to infections.  Neurological:   Positive for headaches. Negative for dizziness.  Hematological:  Negative for swollen glands.  Psychiatric/Behavioral:  Positive for sleep disturbance. Negative for depressed mood. The patient is not nervous/anxious.     PMFS History:  Patient Active Problem List   Diagnosis Date Noted   Abnormal uterine bleeding (AUB) 09/07/2022   UTI (urinary tract infection) 08/15/2021   Hyperglycemia 08/15/2021   Class 2 obesity due to excess calories with body mass index (BMI) of 35.0 to 35.9 in adult 08/15/2021   Lateral epicondylitis of right elbow 08/26/2018   Trigger middle finger of right hand 08/26/2018   Irritable bowel syndrome 06/29/2018   Menstrual migraine 06/29/2018   Fibromyalgia 08/15/2017   Chest pain with low risk for cardiac etiology 09/02/2013   Family history of early CAD 09/02/2013   B12 deficiency     Past Medical History:  Diagnosis Date   Abnormal uterine bleeding    Pt follows with Ellison Hughs, MD. She is scheduled for a hysterectomy on 09/07/22.   Angiolipoma of kidney    around 8 years ago per pt on 08/29/22, benign   Anxiety    follows w/ PCP Dr. Tanya Nones, LOV 07/07/22   Arthritis    "hands" (09/02/2013), neck   Asthma    seasonal, follows with PCP, Dr. Tanya Nones   B12 deficiency    Cervical polyp 2023   Epicondylitis, lateral    Right   Fibromyalgia    sees PCP, Dr. Tanya Nones, LOV 07/07/22 in Epic   GERD (gastroesophageal reflux disease)    History of recurrent  UTIs    Hx of multiple UTIs. As of 08/23/22, most recent UTI on 08/16/22 treated with Macrobid 100 mg bid x 7 days. Urine culture negative. See OV notes from Kurtis Bushman, FNP in [place], dated 08/16/22 & 08/18/22.   IBS (irritable bowel syndrome)    .   Migraine headache    Pt takes Emgality monthly injections and follows with PCP, Dr. Lynnea Ferrier @ [facility].   Palpitations 06/2022   Hx of heart palpitations, PVCs. Pt had increased heart palpitations in 06/2022 during a urinary tract  infection and fever. 06/13/22 EKG in Epic.   UTI (urinary tract infection)     Family History  Problem Relation Age of Onset   COPD Mother    Lung cancer Mother    Coronary artery disease Father 51       MI   Heart attack Father    Alcoholism Father    Cirrhosis Father    Diabetes Sister    Irregular heart beat Sister    Arrhythmia Paternal Uncle    Congestive Heart Failure Paternal Grandmother    Healthy Daughter    Healthy Daughter    Past Surgical History:  Procedure Laterality Date   APPENDECTOMY  12/11/1982   CATARACT EXTRACTION Bilateral 2017   COLONOSCOPY  06/05/2022   One 7 mm sessile sigmoid polyp   CYSTOSCOPY N/A 09/07/2022   Procedure: CYSTOSCOPY;  Surgeon: Carlisle Cater, MD;  Location: [facility];  Service: Gynecology;  Laterality: N/A;   HYSTEROSCOPY  11/2021   LAPAROSCOPIC BILATERAL SALPINGECTOMY Bilateral 09/07/2022   Procedure: LAPAROSCOPIC BILATERAL SALPINGECTOMY;  Surgeon: Carlisle Cater, MD;  Location: [facility];  Service: Gynecology;  Laterality: Bilateral;   LAPAROSCOPIC SUPRACERVICAL HYSTERECTOMY N/A 09/07/2022   Procedure: ABORTED TOTAL LAPAROSCOPIC HYSTERECTOMY / laparoscopic assisted vaginal hysterectomy;  Surgeon: Carlisle Cater, MD;  Location: [facility];  Service: Gynecology;  Laterality: N/A;   PALATAL EXPANSION  12/11/2002   REDUCTION MAMMAPLASTY Bilateral ~ 2009   Social History   Social History Narrative   Not on file   Immunization History  Administered Date(s) Administered   Tdap 12/23/2021     Objective: Vital Signs: BP 128/85 (BP Location: Left Arm, Patient Position: Sitting, Cuff Size: Normal)   Pulse (!) 106   Resp 13   Ht 5' (1.524 m)   Wt 176 lb (79.8 kg)   LMP  (LMP Unknown)   BMI 34.37 kg/m    Physical Exam Vitals and nursing note reviewed.  Constitutional:      Appearance: She is well-developed.  HENT:     Head: Normocephalic and atraumatic.  Eyes:      Conjunctiva/sclera: Conjunctivae normal.  Cardiovascular:     Rate and Rhythm: Normal rate and regular rhythm.     Heart sounds: Normal heart sounds.  Pulmonary:     Effort: Pulmonary effort is normal.     Breath sounds: Normal breath sounds.  Abdominal:     General: Bowel sounds are normal.     Palpations: Abdomen is soft.  Musculoskeletal:     Cervical back: Normal range of motion.  Lymphadenopathy:     Cervical: No cervical adenopathy.  Skin:    General: Skin is warm and dry.     Capillary Refill: Capillary refill takes less than 2 seconds.  Neurological:     Mental Status: She is alert and oriented to person, place, and time.  Psychiatric:  Behavior: Behavior normal.      Musculoskeletal Exam: Cervical, thoracic and lumbar spine were in good range of motion.  She had bilateral trapezius spasm.  Shoulder joints, elbow joints, wrist joints, MCPs PIPs and DIPs with good range of motion with no synovitis.  She had no tenderness over medial epicondyle region.  Hip joints were in good range of motion.  She had tenderness over bilateral trochanteric bursa.  Knee joints in good range of motion without any warmth swelling or effusion.  There was no tenderness over ankles or MTPs.  CDAI Exam: CDAI Score: -- Patient Global: --; Provider Global: -- Swollen: --; Tender: -- Joint Exam 07/11/2023   No joint exam has been documented for this visit   There is currently no information documented on the homunculus. Go to the Rheumatology activity and complete the homunculus joint exam.  Investigation: No additional findings.  Imaging: XR Hand 2 View Left  Result Date: 06/26/2023 CMC, PIP and DIP narrowing was noted.  No MCP, intercarpal or radiocarpal joint space narrowing was noted.  No erosive changes were noted. Impression: These findings are suggestive of osteoarthritis of the hand.  XR Hand 2 View Right  Result Date: 06/26/2023 CMC, PIP and DIP narrowing was noted.  No MCP,  intercarpal or radiocarpal joint space narrowing was noted.  No erosive changes were noted. Impression: These findings are suggestive of osteoarthritis of the hand.   Recent Labs: Lab Results  Component Value Date   WBC 9.8 04/14/2023   HGB 12.5 04/14/2023   PLT 324 04/14/2023   NA 137 04/14/2023   K 3.5 04/14/2023   CL 103 04/14/2023   CO2 24 04/14/2023   GLUCOSE 109 (H) 04/14/2023   BUN 17 04/14/2023   CREATININE 0.76 04/14/2023   BILITOT 0.2 03/27/2023   ALKPHOS 80 07/18/2022   AST 16 03/27/2023   ALT 16 03/27/2023   PROT 6.7 03/27/2023   ALBUMIN 4.4 07/18/2022   CALCIUM 9.2 04/14/2023   GFRAA 101 07/08/2019   June 26, 2023 protein creatinine ratio normal, ANA 1: 40 cytoplasmic, 1: 80 NS, ENA (double-stranded DNA, SSA, SSB, Smith, RNP, SCL 70) negative, C3-C4 normal, anticardiolipin negative, beta-2 GP 1 negative, anti-CCP negative, TSH 6.44 Speciality Comments: No specialty comments available.  Procedures:  No procedures performed Allergies: Cefdinir, Prednisone, Macrolides and ketolides, Ceftin [cefuroxime axetil], and Sulfa antibiotics   Assessment / Plan:     Visit Diagnoses: Positive ANA (antinuclear antibody) - June 26, 2023 protein creatinine ratio normal, ANA 1: 40 cytoplasmic, 1: 80 NS, ENA (double-stranded DNA, SSA, SSB, Smith, RNP, SCL 70) negative, C3-C4 normal, anticardiolipin negative, beta-2 GP 1 negative.  ANA is low titer positive and not significant.  Patient has no clinical features of systemic lupus unrelated illness.  She denies any history of oral ulcers, nasal ulcers, malar rash, photosensitivity, Raynaud's, inflammatory arthritis or lymphadenopathy.  Lab results were discussed with the patient at length.  Advised patient to contact us if she develops any new symptoms.  Polyarthralgia - Pain in multiple joints and muscles since 2019.  Most likely related to fibromyalgia syndrome.  Primary osteoarthritis of both hands -she cannot use to have pain and  stiffness in her hands.  No synovitis was noted.  Clinical and radiographic findings were consistent with osteoarthritis.  Detailed regarding osteoarthritis was provided.  A handout on hand muscle strengthening exercises was given.  Use of arthritis gloves and Voltaren gel was also discussed.  Lateral epicondylitis of right elbow-she has intermittent symptoms.  Currently asymptomatic.  Trigger middle finger of right hand - She had cortisone injections x 3 at the hand center.  Trigger finger, right ring finger - Cortisone injection x 1  Trochanteric bursitis of both hips -she continues to have discomfort in the trochanteric region.  She has nocturnal pain.  A handout on IT band stretches were placed in the AVS.  Some of the stretching exercises were demonstrated in the office.  Chronic pain of left knee -no warmth swelling or effusion was noted.  She had good range of motion of her left knee joint.  She is followed by orthopedics and using a brace.  Fibromyalgia-she has longstanding history of fibromyalgia syndrome since 2018.  She continues to have generalized pain and discomfort.  She had positive tender points.  She takes Flexeril on as needed basis.  Water aerobics and summing were emphasized.  Stretching exercises were also discussed.  Other fatigue-most likely related to fibromyalgia.  Other medical problems are listed as follows:  Other irritable bowel syndrome  B12 deficiency  Family history of early CAD  Orders: No orders of the defined types were placed in this encounter.  No orders of the defined types were placed in this encounter.   Follow-Up Instructions: Return if symptoms worsen or fail to improve, for Osteoarthritis.   Pollyann Savoy, MD  Note - This record has been created using Animal nutritionist.  Chart creation errors have been sought, but may not always  have been located. Such creation errors do not reflect on  the standard of medical care.

## 2023-06-28 LAB — PROTEIN / CREATININE RATIO, URINE
Creatinine, Urine: 172 mg/dL (ref 20–275)
Protein/Creatinine Ratio: 0.081 mg/mg creat (ref 0.024–0.184)
Total Protein, Urine: 14 mg/dL (ref 5–24)

## 2023-06-28 LAB — CYCLIC CITRUL PEPTIDE ANTIBODY, IGG: Cyclic Citrullin Peptide Ab: <16 UNITS

## 2023-06-28 LAB — ANTI-NUCLEAR AB-TITER (ANA TITER)
ANA TITER: 1:80 {titer} — ABNORMAL HIGH
ANA Titer 1: 1:40 {titer} — ABNORMAL HIGH

## 2023-06-28 LAB — ANTI-SCLERODERMA ANTIBODY: Scleroderma (Scl-70) (ENA) Antibody, IgG: <1 AI

## 2023-06-28 LAB — TSH: TSH: 6.44 mIU/L — ABNORMAL HIGH

## 2023-06-28 LAB — SJOGRENS SYNDROME-A EXTRACTABLE NUCLEAR ANTIBODY: SSA (Ro) (ENA) Antibody, IgG: <1 AI

## 2023-06-28 LAB — RNP ANTIBODY: Ribonucleic Protein(ENA) Antibody, IgG: <1 AI

## 2023-06-29 ENCOUNTER — Encounter: Payer: Self-pay | Admitting: Family Medicine

## 2023-06-29 ENCOUNTER — Other Ambulatory Visit: Payer: Self-pay | Admitting: Family Medicine

## 2023-06-29 MED ORDER — CIPROFLOXACIN HCL 500 MG PO TABS: 500.0000 mg | ORAL_TABLET | Freq: Two times a day (BID) | ORAL | 0 refills | Status: AC

## 2023-06-30 ENCOUNTER — Other Ambulatory Visit: Payer: Self-pay | Admitting: Family Medicine

## 2023-07-02 LAB — CARDIOLIPIN ANTIBODIES, IGG, IGM, IGA
Anticardiolipin IgA: <2 APL-U/mL (ref <20.0)
Anticardiolipin IgG: <2 GPL-U/mL (ref <20.0)
Anticardiolipin IgM: 5.7 MPL-U/mL (ref <20.0)

## 2023-07-02 LAB — SJOGRENS SYNDROME-B EXTRACTABLE NUCLEAR ANTIBODY: SSB (La) (ENA) Antibody, IgG: <1 AI

## 2023-07-02 LAB — PROTEIN / CREATININE RATIO, URINE: Protein/Creat Ratio: 81 mg/g creat (ref 24–184)

## 2023-07-02 LAB — ANTI-DNA ANTIBODY, DOUBLE-STRANDED: ds DNA Ab: <1 IU/mL

## 2023-07-02 LAB — C3 AND C4
C3 Complement: 143 mg/dL (ref 83–193)
C4 Complement: 22 mg/dL (ref 15–57)

## 2023-07-02 LAB — BETA-2 GLYCOPROTEIN ANTIBODIES
Beta-2 Glyco 1 IgA: <2 U/mL (ref <20.0)
Beta-2 Glyco 1 IgM: 4.6 U/mL (ref <20.0)
Beta-2 Glyco I IgG: <2 U/mL (ref <20.0)

## 2023-07-02 LAB — ANA: Anti Nuclear Antibody (ANA): POSITIVE — AB

## 2023-07-02 LAB — ANTI-SMITH ANTIBODY: ENA SM Ab Ser-aCnc: <1 AI

## 2023-07-02 NOTE — Telephone Encounter (Signed)
Requested medication (s) are due for refill today: routing for review  Requested medication (s) are on the active medication list: yes  Last refill:  06/29/22  Future visit scheduled: no  Notes to clinic:  Unable to refill per protocol, cannot delegate.      Requested Prescriptions  Pending Prescriptions Disp Refills   promethazine (PHENERGAN) 25 MG tablet [Pharmacy Med Name: PROMETHAZINE 25 MG TABLET] 20 tablet 1    Sig: TAKE 1 TABELT BY MOUTH EVERY 4 HOURS     Not Delegated - Gastroenterology: Antiemetics Failed - 06/30/2023  6:53 AM      Failed - This refill cannot be delegated      Failed - Valid encounter within last 6 months    Recent Outpatient Visits           1 year ago Fatigue, unspecified type   [facility] Donita Brooks, MD   2 years ago Palpitations   [facility] Valentino Nose, NP   2 years ago Bronchitis   [facility] Valentino Nose, NP   2 years ago Neck pain   [facility] Tanya Nones, Priscille Heidelberg, MD   2 years ago Fibromyalgia   [facility] Pickard, Priscille Heidelberg, MD       Future Appointments             In 1 week Pollyann Savoy, MD [facility]

## 2023-07-02 NOTE — Progress Notes (Signed)
I will discuss results on July 11, 2023 at the follow-up visit.

## 2023-07-04 ENCOUNTER — Other Ambulatory Visit: Payer: BC Managed Care – PPO

## 2023-07-04 DIAGNOSIS — R7989 Other specified abnormal findings of blood chemistry: Secondary | ICD-10-CM

## 2023-07-05 LAB — T3, FREE: T3, Free: 2.3 pg/mL (ref 2.3–4.2)

## 2023-07-05 LAB — T4, FREE: Free T4: 1.1 ng/dL (ref 0.8–1.8)

## 2023-07-06 ENCOUNTER — Ambulatory Visit: Payer: BC Managed Care – PPO | Admitting: Family Medicine

## 2023-07-11 ENCOUNTER — Ambulatory Visit: Payer: BC Managed Care – PPO | Attending: Rheumatology | Admitting: Rheumatology

## 2023-07-11 ENCOUNTER — Encounter: Payer: Self-pay | Admitting: Family Medicine

## 2023-07-11 ENCOUNTER — Encounter: Payer: Self-pay | Admitting: Rheumatology

## 2023-07-11 ENCOUNTER — Other Ambulatory Visit: Payer: Self-pay | Admitting: Family Medicine

## 2023-07-11 VITALS — BP 128/85 | HR 106 | Resp 13 | Ht 60.0 in | Wt 176.0 lb

## 2023-07-11 DIAGNOSIS — M19042 Primary osteoarthritis, left hand: Secondary | ICD-10-CM

## 2023-07-11 DIAGNOSIS — M19041 Primary osteoarthritis, right hand: Secondary | ICD-10-CM | POA: Diagnosis not present

## 2023-07-11 DIAGNOSIS — R768 Other specified abnormal immunological findings in serum: Secondary | ICD-10-CM | POA: Diagnosis not present

## 2023-07-11 DIAGNOSIS — M65341 Trigger finger, right ring finger: Secondary | ICD-10-CM

## 2023-07-11 DIAGNOSIS — E538 Deficiency of other specified B group vitamins: Secondary | ICD-10-CM

## 2023-07-11 DIAGNOSIS — M2550 Pain in unspecified joint: Secondary | ICD-10-CM | POA: Diagnosis not present

## 2023-07-11 DIAGNOSIS — M65331 Trigger finger, right middle finger: Secondary | ICD-10-CM

## 2023-07-11 DIAGNOSIS — M25562 Pain in left knee: Secondary | ICD-10-CM

## 2023-07-11 DIAGNOSIS — M7062 Trochanteric bursitis, left hip: Secondary | ICD-10-CM

## 2023-07-11 DIAGNOSIS — R7689 Other specified abnormal immunological findings in serum: Secondary | ICD-10-CM

## 2023-07-11 DIAGNOSIS — M7711 Lateral epicondylitis, right elbow: Secondary | ICD-10-CM | POA: Diagnosis not present

## 2023-07-11 DIAGNOSIS — K588 Other irritable bowel syndrome: Secondary | ICD-10-CM

## 2023-07-11 DIAGNOSIS — M797 Fibromyalgia: Secondary | ICD-10-CM

## 2023-07-11 DIAGNOSIS — G8929 Other chronic pain: Secondary | ICD-10-CM

## 2023-07-11 DIAGNOSIS — M7061 Trochanteric bursitis, right hip: Secondary | ICD-10-CM

## 2023-07-11 DIAGNOSIS — Z8249 Family history of ischemic heart disease and other diseases of the circulatory system: Secondary | ICD-10-CM

## 2023-07-11 DIAGNOSIS — R5383 Other fatigue: Secondary | ICD-10-CM

## 2023-07-11 NOTE — Patient Instructions (Addendum)
Hand Exercises Hand exercises can be helpful for almost anyone. They can strengthen your hands and improve flexibility and movement. The exercises can also increase blood flow to the hands. These results can make your work and daily tasks easier for you. Hand exercises can be especially helpful for people who have joint pain from arthritis or nerve damage from using their hands over and over. These exercises can also help people who injure a hand. Exercises Most of these hand exercises are gentle stretching and motion exercises. It is usually safe to do them often throughout the day. Warming up your hands before exercise may help reduce stiffness. You can do this with gentle massage or by placing your hands in warm water for 10-15 minutes. It is normal to feel some stretching, pulling, tightness, or mild discomfort when you begin new exercises. In time, this will improve. Remember to always be careful and stop right away if you feel sudden, very bad pain or your pain gets worse. You want to get better and be safe. Ask your health care provider which exercises are safe for you. Do exercises exactly as told by your provider and adjust them as told. Do not begin these exercises until told by your provider. Knuckle bend or "claw" fist  Stand or sit with your arm, hand, and all five fingers pointed straight up. Make sure to keep your wrist straight. Gently bend your fingers down toward your palm until the tips of your fingers are touching your palm. Keep your big knuckle straight and only bend the small knuckles in your fingers. Hold this position for 10 seconds. Straighten your fingers back to your starting position. Repeat this exercise 5-10 times with each hand. Full finger fist  Stand or sit with your arm, hand, and all five fingers pointed straight up. Make sure to keep your wrist straight. Gently bend your fingers into your palm until the tips of your fingers are touching the middle of your  palm. Hold this position for 10 seconds. Extend your fingers back to your starting position, stretching every joint fully. Repeat this exercise 5-10 times with each hand. Straight fist  Stand or sit with your arm, hand, and all five fingers pointed straight up. Make sure to keep your wrist straight. Gently bend your fingers at the big knuckle, where your fingers meet your hand, and at the middle knuckle. Keep the knuckle at the tips of your fingers straight and try to touch the bottom of your palm. Hold this position for 10 seconds. Extend your fingers back to your starting position, stretching every joint fully. Repeat this exercise 5-10 times with each hand. Tabletop  Stand or sit with your arm, hand, and all five fingers pointed straight up. Make sure to keep your wrist straight. Gently bend your fingers at the big knuckle, where your fingers meet your hand, as far down as you can. Keep the small knuckles in your fingers straight. Think of forming a tabletop with your fingers. Hold this position for 10 seconds. Extend your fingers back to your starting position, stretching every joint fully. Repeat this exercise 5-10 times with each hand. Finger spread  Place your hand flat on a table with your palm facing down. Make sure your wrist stays straight. Spread your fingers and thumb apart from each other as far as you can until you feel a gentle stretch. Hold this position for 10 seconds. Bring your fingers and thumb tight together again. Hold this position for 10 seconds. Repeat  this exercise 5-10 times with each hand. Making circles  Stand or sit with your arm, hand, and all five fingers pointed straight up. Make sure to keep your wrist straight. Make a circle by touching the tip of your thumb to the tip of your index finger. Hold for 10 seconds. Then open your hand wide. Repeat this motion with your thumb and each of your fingers. Repeat this exercise 5-10 times with each hand. Thumb  motion  Sit with your forearm resting on a table and your wrist straight. Your thumb should be facing up toward the ceiling. Keep your fingers relaxed as you move your thumb. Lift your thumb up as high as you can toward the ceiling. Hold for 10 seconds. Bend your thumb across your palm as far as you can, reaching the tip of your thumb for the small finger (pinkie) side of your palm. Hold for 10 seconds. Repeat this exercise 5-10 times with each hand. Grip strengthening  Hold a stress ball or other soft ball in the middle of your hand. Slowly increase the pressure, squeezing the ball as much as you can without causing pain. Think of bringing the tips of your fingers into the middle of your palm. All of your finger joints should bend when doing this exercise. Hold your squeeze for 10 seconds, then relax. Repeat this exercise 5-10 times with each hand. Contact a health care provider if: Your hand pain or discomfort gets much worse when you do an exercise. Your hand pain or discomfort does not improve within 2 hours after you exercise. If you have either of these problems, stop doing these exercises right away. Do not do them again unless your provider says that you can. Get help right away if: You develop sudden, severe hand pain or swelling. If this happens, stop doing these exercises right away. Do not do them again unless your provider says that you can. This information is not intended to replace advice given to you by your health care provider. Make sure you discuss any questions you have with your health care provider. Document Revised: 12/12/2022 Document Reviewed: 12/12/2022 Else[employer] Patient Education  2024 [employer]  Iliotibial Band Syndrome Rehab Ask your health care provider which exercises are safe for you. Do exercises exactly as told by your provider and adjust them as told. It's normal to feel mild stretching, pulling, tightness, or discomfort as you do these exercises. Stop  right away if you feel sudden pain or your pain gets a lot worse. Do not begin these exercises until told by your provider. Stretching and range-of-motion exercises These exercises warm up your muscles and joints. They also improve the movement and flexibility of your hip and pelvis. Quadriceps stretch, prone  Lie face down (prone) on a firm surface like a bed or padded floor. Bend your left / right knee. Reach back to hold your ankle or pant leg. If you can't reach your ankle or pant leg, use a belt looped around your foot and grab the belt instead. Gently pull your heel toward your butt. Your knee should not slide out to the side. You should feel a stretch in the front of your thigh and knee, also called the quadriceps. Hold this position for __________ seconds. Repeat __________ times. Complete this exercise __________ times a day. Iliotibial band stretch The iliotibial band is a strip of tissue that runs along the outside of your hip down to your knee. Lie on your side with your left / right  leg on top. Bend both knees and grab your left / right ankle. Stretch out your bottom arm to help you balance. Slowly bring your top knee back so your thigh goes behind your back. Slowly lower your top leg toward the floor until you feel a gentle stretch on the outside of your left / right hip and thigh. If you don't feel a stretch and your knee won't go farther, place the heel of your other foot on top of your knee and pull your knee down toward the floor with your foot. Hold this position for __________ seconds. Repeat __________ times. Complete this exercise __________ times a day. Strengthening exercises These exercises build strength and endurance in your hip and pelvis. Endurance means your muscles can keep working even when they're tired. Straight leg raises, side-lying This exercise strengthens the muscles that rotate the leg at the hip and move it away from your body. These muscles are called  hip abductors. Lie on your side with your left / right leg on top. Lie so your head, shoulder, hip, and knee line up. You can bend your bottom knee to help you balance. Roll your hips slightly forward so they're stacked directly over each other. Your left / right knee should face forward. Tense the muscles in your outer thigh and hip. Lift your top leg 4-6 inches (10-15 cm) off the ground. Hold this position for __________ seconds. Slowly lower your leg back down to the starting position. Let your muscles fully relax before doing this exercise again. Repeat __________ times. Complete this exercise __________ times a day. Leg raises, prone This exercise strengthens the muscles that move the hips backward. These muscles are called hip extensors. Lie face down (prone) on your bed or a firm surface. You can put a pillow under your hips for comfort and to support your lower back. Bend your left / right knee so your foot points straight up toward the ceiling. Keep the other leg straight and behind you. Squeeze your butt muscles. Lift your left / right thigh off the firm surface. Do not let your back arch. Tense your thigh muscle as hard as you can without having more knee pain. Hold this position for __________ seconds. Slowly lower your leg to the starting position. Allow your leg to relax all the way. Repeat __________ times. Complete this exercise __________ times a day. Hip hike  Stand sideways on a bottom step. Place your feet so that your left / right leg is on the step, and the other foot is hanging off the side. If you need support for balance, hold onto a railing or wall. Keep your knees straight and your abdomen square, meaning your hips are level. Then, lift your left / right hip up toward the ceiling. Slowly let your leg that's hanging off the step lower towards the floor. Your foot should get closer to the ground. Do not lean or bend your knees during this movement. Repeat __________  times. Complete this exercise __________ times a day. This information is not intended to replace advice given to you by your health care provider. Make sure you discuss any questions you have with your health care provider. Document Revised: 02/09/2023 Document Reviewed: 02/09/2023 Elsevier Patient Education  2024 [employer]  Osteoarthritis  Osteoarthritis is a type of arthritis. It refers to joint pain or joint disease. Osteoarthritis affects tissue that covers the ends of bones in joints (cartilage). Cartilage acts as a cushion between the bones and helps them  move smoothly. Osteoarthritis occurs when cartilage in the joints gets worn down. Osteoarthritis is sometimes called "wear and tear" arthritis. Osteoarthritis is the most common form of arthritis. It often occurs in older people. It is a condition that gets worse over time. The joints most often affected by this condition are in the fingers, toes, hips, knees, and spine, including the neck and lower back. What are the causes? This condition is caused by the wearing down of cartilage that covers the ends of bones. What increases the risk? The following factors may make you more likely to develop this condition: Being age 70 or older. Obesity. Overuse of joints. Past injury of a joint. Past surgery on a joint. Family history of osteoarthritis. What are the signs or symptoms? The main symptoms of this condition are pain, swelling, and stiffness in the joint. Other symptoms may include: An enlarged joint. More pain and further damage caused by small pieces of bone or cartilage that break off and float inside of the joint. Small deposits of bone (osteophytes) that grow on the edges of the joint. A grating or scraping feeling inside the joint when you move it. Popping or creaking sounds when you move. Difficulty walking or exercising. An inability to grip items, twist your hand, or control the movements of your hands and  fingers. How is this diagnosed? This condition may be diagnosed based on: Your medical history. A physical exam. Your symptoms. X-rays of the affected joints. Blood tests to rule out other types of arthritis. How is this treated? There is no cure for this condition, but treatment can help control pain and improve joint function. Treatment may include a combination of therapies, such as: Pain relief techniques, such as: Applying heat and cold to the joint. Massage. A form of talk therapy called cognitive behavioral therapy (CBT). This therapy helps you set goals and follow up on the changes that you make. Medicines for pain and inflammation. The medicines can be taken by mouth or applied to the skin. They include: NSAIDs, such as ibuprofen. Prescription medicines. Strong anti-inflammatory medicines (corticosteroids). Certain nutritional supplements. A prescribed exercise program. You may work with a physical therapist. Assistive devices, such as a brace, wrap, splint, specialized glove, or cane. A weight control plan. Surgery, such as: An osteotomy. This is done to reposition the bones and relieve pain or to remove loose pieces of bone and cartilage. Joint replacement surgery. You may need this surgery if you have advanced osteoarthritis. Follow these instructions at home: Activity Rest your affected joints as told by your health care provider. Exercise as told by your provider. The provider may recommend specific types of exercise, such as: Strengthening exercises. These are done to strengthen the muscles that support joints affected by arthritis. Aerobic activities. These are exercises, such as brisk walking or water aerobics, that increase your heart rate. Range-of-motion activities. These help your joints move more easily. Balance and agility exercises. Managing pain, stiffness, and swelling     If told, apply heat to the affected area as often as told by your provider. Use  the heat source that your provider recommends, such as a moist heat pack or a heating pad. If you have a removable assistive device, remove it as told by your provider. Place a towel between your skin and the heat source. If your provider tells you to keep the assistive device on while you apply heat, place a towel between the assistive device and the heat source. Leave the heat on  for 20-30 minutes. If told, put ice on the affected area. If you have a removable assistive device, remove it as told by your provider. Put ice in a plastic bag. Place a towel between your skin and the bag. If your provider tells you to keep the assistive device on during icing, place a towel between the assistive device and the bag. Leave the ice on for 20 minutes, 2-3 times a day. If your skin turns bright red, remove the ice or heat right away to prevent skin damage. The risk of damage is higher if you cannot feel pain, heat, or cold. Move your fingers or toes often to reduce stiffness and swelling. Raise (elevate) the affected area above the level of your heart while you are sitting or lying down. General instructions Take over-the-counter and prescription medicines only as told by your provider. Maintain a healthy weight. Follow instructions from your provider for weight control. Do not use any products that contain nicotine or tobacco. These products include cigarettes, chewing tobacco, and vaping devices, such as e-cigarettes. If you need help quitting, ask your provider. Use assistive devices as told by your provider. Where to find more information [employer] of Arthritis and Musculoskeletal and Skin Diseases: niams.http://www.myers.net/ [employer] on Aging: BaseRingTones.pl American College of Rheumatology: rheumatology.org Contact a health care provider if: You have redness, swelling, or a feeling of warmth in a joint that gets worse. You have a fever along with joint or muscle aches. You develop a  rash. You have trouble doing your normal activities. You have pain that gets worse and is not relieved by pain medicine. This information is not intended to replace advice given to you by your health care provider. Make sure you discuss any questions you have with your health care provider. Document Revised: 07/27/2022 Document Reviewed: 07/27/2022 Elsevier Patient Education  2024 [employer].

## 2023-07-12 ENCOUNTER — Other Ambulatory Visit: Payer: Self-pay | Admitting: Family Medicine

## 2023-07-12 MED ORDER — OXYCODONE-ACETAMINOPHEN 5-325 MG PO TABS: 1.0000 | ORAL_TABLET | ORAL | 0 refills | Status: DC | PRN

## 2023-07-18 ENCOUNTER — Ambulatory Visit: Admit: 2023-07-18 | Discharge: 2023-07-18 | Payer: PRIVATE HEALTH INSURANCE | Primary: Family Medicine

## 2023-07-18 ENCOUNTER — Encounter: Payer: PRIVATE HEALTH INSURANCE | Primary: Family Medicine

## 2023-07-18 DIAGNOSIS — Z6841 Body Mass Index (BMI) 40.0 and over, adult: Secondary | ICD-10-CM

## 2023-07-18 NOTE — Progress Notes (Signed)
Identified patient with two patient identifiers (name and DOB). Reviewed chart in preparation for visit and have obtained necessary documentation.    Leah Ruiz is a 52 y.o. female  Chief Complaint   Patient presents with    New Patient     Bariatric consult     BP (!) 142/85 (Site: Left Lower Arm, Position: Sitting, Cuff Size: Large Adult)   Pulse 82   Temp 98.7 F (37.1 C) (Oral)   Resp 18   Ht 1.829 m (6')   Wt (!) 157.9 kg (348 lb 3.2 oz)   SpO2 96%   BMI 47.22 kg/m     1. Have you been to the ER, urgent care clinic since your last visit?  Hospitalized since your last visit?no    2. Have you seen or consulted any other health care providers outside of the [facility] since your last visit?  Include any pap smears or colon screening. No  Patient and provider made aware of elevated BP x2. Patient asymptomatic. Patient reminded to monitor BP, continue to take BP medications if prescribed, and follow up with PCP/Cardiologist.  Patient expressed understanding and agreement.

## 2023-07-18 NOTE — Progress Notes (Signed)
Chief Complaint   Patient presents with    New Patient     Bariatric consult       Referred by Dr. Theodoro Grist      Chief complaint:  Evaluation for bariatric surgery     History of Present Illness:       Leah Ruiz is a new patient seeking surgical treatment for morbid obesity.      is a new patient seeking surgical treatment for morbid obesity.    Patient has had obesity for since childhood years.   She attended the bariatric seminar online prior to this visit.   Patient states she has struggled with severe obesity since childhood.   Her highest adult weight has been 348 lbs    Her lowest adult weight has been 302 lbs.   She has tried multiple unsupervised diets and exercise without significant or sustained weight loss and recently she has not been able to lose any weight. Has tried over the counter diet medications  She wants to lose weight to increase her activity level, for her health and   Improve knee and back pain  she would like to lose 90-100 pounds.     Body mass index is 47.22 kg/m.       Surgical Risk factors:  General     Diabetes  n Insulin n     Current smoker within past 12 months  n      Functional Health Status  y Independent     Pulmonary     COPD  n   Oxygen dependent  n     OSA (requiring CPAP/BiPap) Y has CPAP machine and uses       Gastrointestinal     GERD requiring medication past 30 days  Y  Takes daily otherwise symptoms      Musculoskeletal     Is ambulation limited most of the time or all the time n     Cardiac    Previous MI n        Hypertension requiring medication  y         Hyperlipidemia requiring medication n     Vascular     History of DVT n         Venous stasis n     IVC filter  n      Renal    Dialysis  n    Renal insufficiency  n     Other  Steroids/Immunosuppressants for chronic condition n   Previous abdominal surgery     tubuligation   There is no problem list on file for this patient.      Past Surgical History:   Procedure Laterality Date    TUBAL LIGATION       Past  Medical History:   Diagnosis Date    Depression 05/23/2019    Hypertension     Osteoarthritis 06/02/2020    Sleep apnea 01/30/2022     Family History   Problem Relation Age of Onset    Diabetes Mother     High Blood Pressure Mother     Heart Disease Father     Arthritis Maternal Grandmother     Cancer Paternal Grandfather     Heart Attack Brother      Social History     Socioeconomic History    Marital status: Single     Spouse name: Not on file    Number of children: Not on file    Years of  education: Not on file    Highest education level: Not on file   Occupational History    Not on file   Tobacco Use    Smoking status: Never    Smokeless tobacco: Never   Substance and Sexual Activity    Alcohol use: Yes     Alcohol/week: 6.0 standard drinks of alcohol     Types: 3 Glasses of wine, 3 Shots of liquor per week     Comment: occasionally    Drug use: Never    Sexual activity: Yes     Partners: Female   Other Topics Concern    Not on file   Social History Narrative    Not on file     Social Determinants of Health     Financial Resource Strain: Not on file   Food Insecurity: Not on file   Transportation Needs: Not on file   Physical Activity: Not on file   Stress: Not on file   Social Connections: Not on file   Intimate Partner Violence: Not on file   Housing Stability: Not on file       Current Outpatient Medications:     predniSONE (DELTASONE) 20 MG tablet, Take 3 tablets once a day for 2 days, then take 2 tablets once a day for 2 days, then take 1 take once a day for 2 days, Disp: 12 tablet, Rfl: 1    azithromycin (ZITHROMAX Z-PAK) 250 MG tablet, Take 2 tablets (500 mg) on Day 1, and then take 1 tablet (250 mg) on days 2 through 5., Disp: 1 packet, Rfl: 0    acetaminophen (TYLENOL) 500 MG tablet, Take 2 tablets by mouth every 8 hours as needed for Fever or Pain, Disp: 360 tablet, Rfl: 1    amLODIPine (NORVASC) 5 MG tablet, , Disp: , Rfl:     losartan-hydroCHLOROthiazide (HYZAAR) 100-12.5 MG per tablet, , Disp: , Rfl:      ketorolac (TORADOL) 10 MG tablet, Take 1 tablet by mouth every 6 hours as needed for Pain, Disp: 20 tablet, Rfl: 0    ibuprofen (ADVIL) 200 MG tablet, Take 2 tablets by mouth every 8 hours as needed for Pain, Disp: 20 tablet, Rfl: 0  No Known Allergies  Mohiuddin, Glade Lloyd, MD    NOT TAKING PREDNISONE  Review of Systems   Constitutional: Negative.    HENT: Negative.     Eyes: Negative.    Respiratory: Negative.     Cardiovascular: Negative.    Gastrointestinal: Negative.    Endocrine: Negative.    Genitourinary: Negative.    Musculoskeletal: Negative.    Skin: Negative.    Allergic/Immunologic: Negative.    Neurological: Negative.    Hematological: Negative.    Psychiatric/Behavioral: Negative.           STOPBANG questionnaire     Do you Snore loudly? n  Do you often feel Tired, fatigued, or sleepy during the daytime? n  Has anyone Observed you stop breathing during your sleep? n  Are you being treated for high blood Pressure? y  BMI more than 35 kg/m2? y  Age over 75 years old? y  Neck Circumference >16 inches? n  Gender female? n  ______________________________________     SCORE: 3     If YES to 0 - 2, low risk of sleep apnea  If YES to 3 - 4 intermediate risk of having sleep apnea  If YES to 5 - 8 high risk of having sleep apnea (or 2 +  BMI 35 or Neck > 17" or Female)     Physical Exam  Constitutional:       Appearance: She is obese.   HENT:      Head: Normocephalic and atraumatic.   Cardiovascular:      Rate and Rhythm: Normal rate.   Pulmonary:      Effort: Pulmonary effort is normal.   Abdominal:      General: There is no distension.      Palpations: Abdomen is soft.      Tenderness: There is no abdominal tenderness. There is no guarding or rebound.   Neurological:      Mental Status: She is alert and oriented to person, place, and time.   Psychiatric:         Mood and Affect: Mood normal.         Behavior: Behavior normal.         Thought Content: Thought content normal.         Judgment: Judgment normal.             ICD-10-CM    1. Class 3 severe obesity with body mass index (BMI) of 45.0 to 49.9 in adult, unspecified obesity type, unspecified whether serious comorbidity present (HCC)  E66.01 CBC with Auto Differential    Z68.42 Comprehensive Metabolic Panel     Ferritin/Iron and TIBC     Folate     Hemoglobin A1C     Lipid Panel     Thyroid Panel with TSH     Vitamin B1, Whole Blood     Vitamin B12     Vitamin B2     Vitamin B6     Vitamin D 25 Hydroxy     Zinc      2. OSA (obstructive sleep apnea)  G47.33           Leah Ruiz meets criteria established by the NIH. Without weight reduction, co-morbidities will escalate as well as risk of early mortality. Recommendation is patient could be served with surgical weight reduction, the procedure of not sure if wants sleeve or bypass. Recommended bypass. Will decide on final review. I explained to the patient differences between laparoscopic gastric bypass and sleeve gastrectomy procedures. Patient has participated in our informational session either in person or on line.      We have discussed the possible complications of bariatric surgery which include but are not limited to failed weight loss, weight regain, malnutrition, leak, bleed, stricture, gastric ulcer, gastric fistula, gastric bleed, gallstones, new or worsening gastric reflux, nausea, emesis, internal hernia, abdominal wall hernia, gastric perforation, need for revision / conversion / or reversal, pregnancy complications and loss, intestinal ischemia, post operative skin complications, possible thinning of their hair, bowel obstruction, dumping syndrome, wound infection, blood clots (DVT, mesenteric thrombus, pulmonary embolism), increased addictive tendency, risk of anesthesia, and death.     I have reinforced without lifestyle change and behavior modification, Leah Ruiz may not achieve weight loss goals. I reviewed risks and complications associated with each procedure.     I discussed a diet high  in protein, low-fat, low- sugar, limited carbohydrates, and discontinuing use of carbonated beverages and all sweet drinks.  Importance of life long follow up was discussed as well as the chronic nature of obesity.  Obesity is affected by multiple factors, including diet, exercise, stress, sleep, medication, age, hormones, etc.  Surgery is not a cure for obesity, yet can be an effective tool in the management  of obesity.        The patient understands this is a life altering decision and will require compliance to the program for the remainder of their life in order to be monitored to avoid complication and ensure successful, sustained weight control. They will be placed on a lifelong low carbohydrate and low sugar diet, exercise, and vitamin regimen and will require frequent blood draws and office visits to ensure adequate nutrition and program compliance. Visits and follow up will be in compliance with the guidelines set forth by [facility]. I have specifically mentioned the need to avoid all personal and second-hand tobacco exposure, systemic steroids, and NSAIDS after any bariatric surgery to help avoid the above listed complications. The patient has expressed understanding of the above and would like to enroll in the program. The patient will be submitted for medical and psychological clearance along with establishing with our dietician and joining the pre / post operative support group. They will be screened for depression and sleep apnea and treated pre operatively if needed. The patient will have to demonstrate cessation of tobacco if relevant for at least 3 months preoperatively along with having a controlled HbA1c less than 8. After successful completion of the preoperative regimen the patient will be submitted for insurance approval and pending this will be scheduled for surgery.      The patient will be required to not gain weight during this period if starting BMI is 35-45, lose 5% of starting weight if  BMI is >45, or lose 10% of starting weight if BMI >60 during the supervised weight loss / pre operative process before our next visit for pre-operative consents. The goal for this patient is to lose 25 lbs.    Recommendations:    We recommend that the patient undergo the following evaluations prior to considering surgery:    Dietician: Yes  Psychiatry/Psychology: Yes  Sleep Medicine: no diagnosed with OSA has and uses CPAP. Bring machine to surgery  Upper endoscopy to be performed by me: yes  Counseled to stop NSAIDS  Nutrient labs: yes        All questions from the patient have been answered and they have demonstrated appropriate understanding of the process.      Total time involved with this patient's care was: 40 minutes. This involved reviewing patient record, talking with patient, and charting on patient.    40 mins of time was spent with the patient including reviewing chart, history and physical examination, reviewing labs and imaging and discussing treatment plan with patient and their family.      Signed By: Collene Leyden, MD  Bariatric and General Surgeon  [facility]    July 23, 2023

## 2023-07-23 ENCOUNTER — Encounter

## 2023-07-27 ENCOUNTER — Encounter
Admit: 2023-07-27 | Discharge: 2023-07-27 | Payer: PRIVATE HEALTH INSURANCE | Attending: Registered" | Primary: Family Medicine

## 2023-07-27 NOTE — Progress Notes (Signed)
Pre-operative Bariatric Nutrition Evaluation - [employer] 1 of 6     Date: 07/27/2023   Physician/Surgeon:Linda Adepoju, M.D.   Name: Leah Ruiz  DOB:  03-11-1971  Age:  52  Gender: Female   Type of Surgery: [x]            Gastric Bypass   [x]            Sleeve Gastrectomy    ASSESSMENT:      Medications/Supplements:   Prior to Admission medications    Medication Sig Start Date End Date Taking? Authorizing Provider   predniSONE (DELTASONE) 20 MG tablet Take 3 tablets once a day for 2 days, then take 2 tablets once a day for 2 days, then take 1 take once a day for 2 days 05/19/23   Rosalin Hawking, MD   azithromycin (ZITHROMAX Z-PAK) 250 MG tablet Take 2 tablets (500 mg) on Day 1, and then take 1 tablet (250 mg) on days 2 through 5. 05/19/23   Rosalin Hawking, MD   ibuprofen (ADVIL) 200 MG tablet Take 2 tablets by mouth every 8 hours as needed for Pain 05/19/23 05/26/23  Rosalin Hawking, MD   acetaminophen (TYLENOL) 500 MG tablet Take 2 tablets by mouth every 8 hours as needed for Fever or Pain 05/19/23   Rosalin Hawking, MD   amLODIPine (NORVASC) 5 MG tablet  09/08/22   [provider]   losartan-hydroCHLOROthiazide Mauri Reading) 100-12.5 MG per tablet  09/08/22   [provider]   ketorolac (TORADOL) 10 MG tablet Take 1 tablet by mouth every 6 hours as needed for Pain 10/15/22   Cecil Cranker, MD       Food Allergies/Intolerances:none    Anthropometrics:    Ht:6'   Recent Office Wt: 348#    IBW: 160#    %IBW: 217%     BMI:47    Category: obesity III     Reported wt history: Pt presents today for pre-op nutrition evaluation for wt loss surgery. Attributes wt gain over the years r/t unhealthy eating habits over the years often r/t depression or emotional/boredom eating. Has attempted wt loss through various methods with most successful wt loss of 20# . Has been unable to maintain long term or significant wt loss and is now seeking approval for weight loss surgery. Pt will need to complete 6 months of supervised weight loss  for insurance requirements with a 25# wt loss recommended during that time.      Exercise/Physical Activity: none    Reported Diet History:self-directed diets, unintentional wt loss     24 Hour Diet Recall - grazing over 1-2 hours to finish a meal, grazing throughout the day  Breakfast  Skips    Lunch 1-2 pm  Piece of spam or [place] or chicken (does not like to cook, quick/easy)   Dinner 6-7 pm  Sara Lee, chocolate (during menstrual cycle)    Beverages  Water, alcohol (vodka or wine cooler) on weekends; occasional ginger ale       Environment/Psychosocial/Support: Pt reports a fair support system but states she mostly does things on her own. Pt lives with her daughter and takes care of her grandchildren (45 year old). Pt does her own grocery shopping and cooking. Pt has a friend who had weight loss surgery many years ago but has not yet spoken to her about it.     NUTRITION DIAGNOSIS:  Self-monitoring deficit r/t previous lack of  value of this change evidenced by pt reports tendency to graze throughout the day.       NUTRITION INTERVENTION:  Pt educated on nutrition recommendations for weight loss surgery, specifically undecided.    Instructed on consuming 3 meals per day starting now.  Use the balanced plate method to plan meals, include 3 oz of lean source of protein, 1/2 cup whole grains, unlimited non-starchy vegetables, 1/2 cup fruit and 1 serving of low fat dairy. Utilize handouts listing healthy snack and meal ideas to limit restaurant meals.      After surgery measure all meals to 1/2 cup. Each meal will contain a 1/4 cup lean protein and 1/4 cup fruit, non-starchy vegetable or starch (limiting to once per day). Aim for 60 g protein per day. Sip on 48-64 oz of sugar free, calorie free, non-carbonated beverages each day. Do not use a straw. Do not consume beverages 30 minutes before, during or 30 minutes after meals.     Read all nutrition labels. Demonstrated and emphasized  identifying serving size, total fat, sugar and protein content. Defined low fat as </= 3 g per serving. Discussed lean and extra lean sources of protein. Provided list of low fat cooking methods. Avoid foods with sugar listed in the first 3 ingredients and >/15 g sugar per serving. Excess sugar/fat intake may lead to dumping syndrome. Discussed signs and symptoms of dumping syndrome.     Practice mindful eating habits; take small bites, chew thoroughly, avoid distractions, utilize hunger/fullness scale. Consume meals over 20-30 minutes.     Attend Bariatric Support Group and increase physical activity (approved per MD) for long term weight maintenance.      NUTRITION MONITORING AND EVALUATION:    The following goals were established with patient;  Decrease grazing behaviors. Will need to implement more structured meal patterns. Allow 20 minutes to finish a meal and stop when full. Do not eat again until the next meal. Avoid grazing/picking at meals in between meal times.   Use a protein shake in place of skipping breakfast. Aim for 9 am for breakfast (lunch around 1/2 pm and dinner around 6 pm). We discussed implementing a 4 hour eating schedule.    Print and review nutrition education materials. Follow up next month for continued nutrition education and supervised weight loss.         Specific tips and techniques to facilitate compliance with above recommendations were provided and discussed. Nutrition evaluation reveals important lifestyle and behavior changes are indicated. Goals set and recommendations made. Will continue to assess. If further details are desired please feel free to contact me at [phone].  This phone number was also provided to the patient for any further questions or concerns.           Epifanio Lesches, RD

## 2023-08-20 ENCOUNTER — Other Ambulatory Visit: Payer: Self-pay | Admitting: Family Medicine

## 2023-08-23 ENCOUNTER — Encounter: Payer: PRIVATE HEALTH INSURANCE | Attending: Registered" | Primary: Family Medicine

## 2023-08-31 ENCOUNTER — Encounter: Payer: Self-pay | Admitting: Family Medicine

## 2023-08-31 ENCOUNTER — Other Ambulatory Visit: Payer: Self-pay | Admitting: Family Medicine

## 2023-08-31 MED ORDER — OXYCODONE-ACETAMINOPHEN 5-325 MG PO TABS: 1.0000 | ORAL_TABLET | ORAL | 0 refills | Status: DC | PRN

## 2023-09-06 ENCOUNTER — Encounter: Payer: BC Managed Care – PPO | Admitting: Rheumatology

## 2023-09-10 ENCOUNTER — Ambulatory Visit
Admit: 2023-09-10 | Discharge: 2023-09-10 | Payer: PRIVATE HEALTH INSURANCE | Attending: Registered" | Primary: Family Medicine

## 2023-09-10 NOTE — Progress Notes (Signed)
 Tree Sur[facility] at [facility]  Supervised Weight Loss     Date:   09/10/2023    Patient's Name: Leah Ruiz  DOB: 04/16/1971    Insurance:  [employer]            Session: 2 of  6  Surgery: Gastric Bypass vs Sleeve Gastrectomy    Surgeon:  Rock Deutscher, M.D.     Height: 6'   Previous Weight:    348      Lbs.   BMI: 47   Pounds Lost since last month: 0               Pounds Gained since last month: 0    Starting Weight: 348#   Previous Month's Weight: 348  Overall Pounds Lost: 0  Overall Pounds Gained: 0    Other Pertinent Information: Today's appointment was completed over the phone.  Pt reports financial constraints that may prevent purchase of protein shakes.     Smoking Status:  none  Alcohol Intake: none    I have reviewed with pt the guidelines of the supervised wt loss program.  Pt understands the expectations of some wt loss during the program and that wt gain could delay the process. I have also explained that appointments need to be consecutive and missing an appointment may result in starting over. Pt has received this information in writing.          Changes that patient has made since last month include:  skipping meals.      Eating Habits and Behaviors  General healthy eating guidelines were discussed. A nutrition lesson was presented on portion control. Patients were instructed implement portion control now using the balanced plate method (1/2 plate non-starchy vegetables, 1/4 plate lean meat, and 1/4 plate whole grains and to include fruit and/or milk at meals or snack). We discussed measuring meals to 1/2 cup total per meal after surgery and appropriate portion progression long term.                       Patient's current diet habits include: Pt is eating 2 meals per day. Tends to skip breakfast stating I'm not a breakfast person and not hungry in the morning. Will skips if she gets busy. If eating lunch would have some slices of turkey and crackers. Snack choices include  pork skins, chips. Dinner is usually fried chicken or piece of ham/turkey. Pt is eating refined carbohydrate foods (bread, pasta, rice, potatoes) in moderation. Pt is using fried or air fried cooking methods. Pt is eating meals prepared outside of the home none. Pt is drinking water, SF flavor packs, juice.         Physical Activity/Exercise  We talked about the importance of increasing daily physical activity and beginning to develop an exercise regimen/routine. We talked about exercise as being an important part of long term weight loss after surgery.     Comments:  During class, I discussed with patient the importance of getting into an exercise routine.  Pt is currently not exercising. Is active with caring for 87 year old grandson.     Behavior Modification       We talked about how to eat more mindfully. Tips and recommendations for how to make these changes were provided. Pt was encouraged to keep a food journal and record what they were taking in daily.         Overall Assessment: Pt demonstrates some  small changes with continued changes indicated. Goals set and recommendations made. Will continue to assess.     Patient-Set Goals:   1. Nutrition - eat 3 meals a day, avoid skipping meals  2. Exercise - none  3. Behavior -purchase/try protein shakes     Andriette Hasten, RD  09/10/2023

## 2023-09-10 NOTE — Telephone Encounter (Signed)
 LM for patient to call me to schedule endo  with Dr Carol Ada.

## 2023-09-12 ENCOUNTER — Encounter

## 2023-09-12 NOTE — Telephone Encounter (Signed)
LM for patient to call me to schedule ENDO with Dr Carol Ada.

## 2023-09-12 NOTE — Telephone Encounter (Signed)
 Spoke to patient to schedule her EGD with Dr Aretha at [facility].  I offered 10/24 @ 8:30am with arrival time of 7:00am and She accepted.    I let the patient know they are required to bring someone with them that will be responsible to take them home along with their photo ID and insurance card.      If you are taking any weight lose medication, you need to stop one week before procedure                            Trulicity (dulaglutide)                          Wegovy (Semaglutide)                          Ozempic (Semaglutide)                          Rybelsus (tirzepatide)                          Mounjaro (tirzepatide)                           Zepbound (tirzepatide)        I let them know once this is scheduled I will put the information in a letter along with where they need to go and pre-procedure instructions.   This letter will post to their my chart and go out in the mail.  she acknowledged thank you

## 2023-09-12 NOTE — Telephone Encounter (Signed)
 Attempted to call pt to review requirements prior to midway.    -PCP Support Form  -Complete Nutrition Visits(Scheduled initial for October)  -Completed Psych Eval  -Completed Labs  -Completed EGD(Scheduled for October)

## 2023-09-13 NOTE — Telephone Encounter (Signed)
 Identified patient with two patient identifiers (name and DOB). Reviewed chart in preparation for encounter and have obtained necessary documentation.     Spoke w/ pt confirmed requirements,     -faxed letter to pt's PCP   -emailed pt psych evaluation to [email or web address]   -informed pt to complete labs prior to midway    Pt expressed understanding of all that was discussed.

## 2023-09-13 NOTE — Telephone Encounter (Signed)
 Patient left VM regarding requirements

## 2023-09-18 NOTE — Telephone Encounter (Signed)
LM for pt to call and schedule psych. Eval.

## 2023-09-20 ENCOUNTER — Other Ambulatory Visit: Payer: Self-pay | Admitting: Family Medicine

## 2023-09-20 NOTE — Telephone Encounter (Signed)
Requested medication (s) are due for refill today: yes  Requested medication (s) are on the active medication list: yes  Last refill:  08/20/23 #30/0  Future visit scheduled: no  Notes to clinic:  Unable to refill per protocol, cannot delegate.      Requested Prescriptions  Pending Prescriptions Disp Refills   ALPRAZolam (XANAX) 0.5 MG tablet [Pharmacy Med Name: ALPRAZOLAM 0.5 MG TABLET] 30 tablet 0    Sig: TAKE 1 TABLET BY MOUTH TWICE A DAY AS NEEDED FOR ANXIETY     Not Delegated - Psychiatry: Anxiolytics/Hypnotics 2 Failed - 09/20/2023  5:01 PM      Failed - This refill cannot be delegated      Failed - Urine Drug Screen completed in last 360 days      Failed - Valid encounter within last 6 months    Recent Outpatient Visits           1 year ago Fatigue, unspecified type   [facility] Donita Brooks, MD   2 years ago Palpitations   [facility] Valentino Nose, NP   2 years ago Bronchitis   [facility] Valentino Nose, NP   2 years ago Neck pain   [facility] Tanya Nones, Priscille Heidelberg, MD   2 years ago Fibromyalgia   [facility] Pickard, Priscille Heidelberg, MD              Passed - Patient is not pregnant

## 2023-09-27 ENCOUNTER — Other Ambulatory Visit: Payer: Self-pay | Admitting: Family Medicine

## 2023-09-27 MED ORDER — ALPRAZOLAM 0.5 MG PO TABS: 0.5000 mg | ORAL_TABLET | Freq: Three times a day (TID) | ORAL | 0 refills | Status: DC | PRN

## 2023-09-30 ENCOUNTER — Emergency Department (HOSPITAL_BASED_OUTPATIENT_CLINIC_OR_DEPARTMENT_OTHER): Payer: BC Managed Care – PPO

## 2023-09-30 ENCOUNTER — Encounter (HOSPITAL_BASED_OUTPATIENT_CLINIC_OR_DEPARTMENT_OTHER): Payer: Self-pay | Admitting: Emergency Medicine

## 2023-09-30 ENCOUNTER — Emergency Department (HOSPITAL_BASED_OUTPATIENT_CLINIC_OR_DEPARTMENT_OTHER)
Admission: EM | Admit: 2023-09-30 | Discharge: 2023-09-30 | Disposition: A | Discharge status: Home / Self Care | Payer: BC Managed Care – PPO | Attending: Emergency Medicine | Admitting: Emergency Medicine

## 2023-09-30 ENCOUNTER — Other Ambulatory Visit: Payer: Self-pay

## 2023-09-30 DIAGNOSIS — M549 Dorsalgia, unspecified: Secondary | ICD-10-CM | POA: Insufficient documentation

## 2023-09-30 DIAGNOSIS — K644 Residual hemorrhoidal skin tags: Secondary | ICD-10-CM | POA: Insufficient documentation

## 2023-09-30 DIAGNOSIS — R1032 Left lower quadrant pain: Secondary | ICD-10-CM | POA: Insufficient documentation

## 2023-09-30 LAB — CBC WITH DIFFERENTIAL/PLATELET
Abs Immature Granulocytes: 0.01 10*3/uL (ref 0.00–0.07)
Basophils Absolute: 0.1 10*3/uL (ref 0.0–0.1)
Basophils Relative: 1 %
Eosinophils Absolute: 0.1 10*3/uL (ref 0.0–0.5)
Eosinophils Relative: 1 %
HCT: 37.5 % (ref 36.0–46.0)
Hemoglobin: 11.7 g/dL — ABNORMAL LOW (ref 12.0–15.0)
Immature Granulocytes: 0 %
Lymphocytes Relative: 24 %
Lymphs Abs: 1.6 10*3/uL (ref 0.7–4.0)
MCH: 24.2 pg — ABNORMAL LOW (ref 26.0–34.0)
MCHC: 31.2 g/dL (ref 30.0–36.0)
MCV: 77.5 fL — ABNORMAL LOW (ref 80.0–100.0)
Monocytes Absolute: 0.4 10*3/uL (ref 0.1–1.0)
Monocytes Relative: 5 %
Neutro Abs: 4.8 10*3/uL (ref 1.7–7.7)
Neutrophils Relative %: 69 %
Platelets: 272 10*3/uL (ref 150–400)
RBC: 4.84 MIL/uL (ref 3.87–5.11)
RDW: 13.4 % (ref 11.5–15.5)
WBC: 6.9 10*3/uL (ref 4.0–10.5)
nRBC: 0 % (ref 0.0–0.2)

## 2023-09-30 LAB — COMPREHENSIVE METABOLIC PANEL
ALT: 18 U/L (ref 0–44)
AST: 19 U/L (ref 15–41)
Albumin: 4.6 g/dL (ref 3.5–5.0)
Alkaline Phosphatase: 77 U/L (ref 38–126)
Anion gap: 7 (ref 5–15)
BUN: 18 mg/dL (ref 6–20)
CO2: 27 mmol/L (ref 22–32)
Calcium: 9.9 mg/dL (ref 8.9–10.3)
Chloride: 104 mmol/L (ref 98–111)
Creatinine, Ser: 0.85 mg/dL (ref 0.44–1.00)
GFR, Estimated: >60 mL/min (ref >60)
Glucose, Bld: 86 mg/dL (ref 70–99)
Potassium: 3.9 mmol/L (ref 3.5–5.1)
Sodium: 138 mmol/L (ref 135–145)
Total Bilirubin: 0.3 mg/dL (ref 0.3–1.2)
Total Protein: 6.9 g/dL (ref 6.5–8.1)

## 2023-09-30 LAB — OCCULT BLOOD X 1 CARD TO LAB, STOOL: Fecal Occult Bld: NEGATIVE

## 2023-09-30 LAB — LIPASE, BLOOD: Lipase: 26 U/L (ref 11–51)

## 2023-09-30 MED ORDER — ACETAMINOPHEN 500 MG PO TABS
1000.0000 mg | ORAL_TABLET | Freq: Once | ORAL | Status: AC
  Administered 2023-09-30: 1000 mg via ORAL
  Filled 2023-09-30: qty 2

## 2023-09-30 MED ORDER — IOHEXOL 300 MG/ML  SOLN
100.0000 mL | Freq: Once | INTRAMUSCULAR | Status: AC | PRN
  Administered 2023-09-30: 100 mL via INTRAVENOUS

## 2023-09-30 NOTE — ED Notes (Signed)
Patient transported to CT via wheelchair with tech. 

## 2023-09-30 NOTE — ED Triage Notes (Signed)
Lower left abdo pain into back x 3 weeks. Some bright red blood with clots in toilet after voiding

## 2023-09-30 NOTE — ED Provider Notes (Signed)
[place] EMERGENCY DEPARTMENT AT [facility] Provider Note   CSN: [id number] Arrival date & time: 09/30/23  1502     History  Chief Complaint  Patient presents with   Back Pain    Andrea [name] is a 52 y.o. female with history of IBS, hemorrhoids, presents with concern for left lower quadrant abdominal pain that has been intermittent for the past 3 weeks.  States the pain worsens when she eats.  She also has been having some bright red blood in her stools along with this pain.  She reports some diarrhea when this first started, but now is having normal formed stools.  Denies any fever or chills, dysuria, hematuria, increased frequency.  Prior abdominal surgeries include appendectomy, hysterectomy   Back Pain      Home Medications Prior to Admission medications   Medication Sig Start Date End Date Taking? Authorizing Provider  albuterol (VENTOLIN HFA) 108 (90 Base) MCG/ACT inhaler INHALE 2 PUFFS INTO THE LUNGS EVERY 6 HOURS AS NEEDED FOR WHEEZE OR SHORTNESS OF BREATH 08/19/21   Donita Brooks, MD  ALPRAZolam Prudy Feeler) 0.5 MG tablet Take 1 tablet (0.5 mg total) by mouth 3 (three) times daily as needed for anxiety. 09/27/23   Donita Brooks, MD  cyclobenzaprine (FLEXERIL) 10 MG tablet TAKE 1 TABLET BY MOUTH EVERY 8 HOURS AS NEEDED FOR MUSCLE SPASMS 06/15/23   Donita Brooks, MD  diclofenac (VOLTAREN) 75 MG EC tablet Take 75 mg by mouth 2 (two) times daily as needed. 06/19/23   [provider]  docusate sodium (COLACE) 100 MG capsule Take 200 mg by mouth daily as needed for mild constipation.    [provider]  EMGALITY 120 MG/ML SOAJ SMARTSIG:120 Milligram(s) SUB-Q Once a Month 06/18/23   Donita Brooks, MD  linaclotide [facility]) 290 MCG CAPS capsule TAKE 1 CAPSULE BY MOUTH DAILY BEFORE BREAKFAST. 06/08/23   Donita Brooks, MD  oxyCODONE-acetaminophen (PERCOCET/ROXICET) 5-325 MG tablet Take 1 tablet by mouth as needed for severe pain (for  migraines). 08/31/23   Donita Brooks, MD  promethazine (PHENERGAN) 25 MG tablet TAKE 1 TABELT BY MOUTH EVERY 4 HOURS 07/02/23   Donita Brooks, MD  sodium chloride (OCEAN) 0.65 % SOLN nasal spray Place 1 spray into both nostrils as needed for congestion.    [provider]      Allergies    Cefdinir, Prednisone, Macrolides and ketolides, Ceftin [cefuroxime axetil], and Sulfa antibiotics    Review of Systems   Review of Systems  Musculoskeletal:  Positive for back pain.    Physical Exam Updated Vital Signs BP 124/86   Pulse 81   Temp 98.6 F (37 C) (Oral)   Resp 18   LMP  (LMP Unknown)   SpO2 99%  Physical Exam Vitals and nursing note reviewed. Exam conducted with a chaperone present.  Constitutional:      General: She is not in acute distress.    Appearance: She is well-developed.  HENT:     Head: Normocephalic and atraumatic.  Eyes:     Conjunctiva/sclera: Conjunctivae normal.  Cardiovascular:     Rate and Rhythm: Normal rate and regular rhythm.     Heart sounds: No murmur heard. Pulmonary:     Effort: Pulmonary effort is normal. No respiratory distress.     Breath sounds: Normal breath sounds.  Abdominal:     Palpations: Abdomen is soft.     Tenderness: There is no abdominal tenderness.     Comments:  Abdomen soft and nontender.  Patient does not react when palpating the left lower quadrant, but reports mild pain  No CVA tenderness bilaterally  Genitourinary:    Comments: Large external hemorrhoid, nonthrombosed Musculoskeletal:        General: No swelling.     Cervical back: Neck supple.  Skin:    General: Skin is warm and dry.     Capillary Refill: Capillary refill takes less than 2 seconds.  Neurological:     Mental Status: She is alert.  Psychiatric:        Mood and Affect: Mood normal.     ED Results / Procedures / Treatments   Labs (all labs ordered are listed, but only abnormal results are displayed) Labs Reviewed  CBC WITH  DIFFERENTIAL/PLATELET - Abnormal; Notable for the following components:      Result Value   Hemoglobin 11.7 (*)    MCV 77.5 (*)    MCH 24.2 (*)    All other components within normal limits  COMPREHENSIVE METABOLIC PANEL  LIPASE, BLOOD  OCCULT BLOOD X 1 CARD TO LAB, STOOL    EKG None  Radiology CT ABDOMEN PELVIS W CONTRAST  Result Date: 09/30/2023 CLINICAL DATA:  Left lower quadrant pain and hematuria. History of appendectomy and hysterectomy. EXAM: CT ABDOMEN AND PELVIS WITH CONTRAST TECHNIQUE: Multidetector CT imaging of the abdomen and pelvis was performed using the standard protocol following bolus administration of intravenous contrast. RADIATION DOSE REDUCTION: This exam was performed according to the departmental dose-optimization program which includes automated exposure control, adjustment of the mA and/or kV according to patient size and/or use of iterative reconstruction technique. CONTRAST:  OMNIPAQUE IOHEXOL 300 MG/ML  SOLN COMPARISON:  CT with IV contrast 06/13/2022, 08/15/2021. FINDINGS: Lower chest: No abnormality. Hepatobiliary: No focal liver abnormality is seen. The liver is mildly steatotic. No calcified gallstones, gallbladder wall thickening, or biliary dilatation. Pancreas: No abnormality. Spleen: No abnormality.  No splenomegaly. Adrenals/Urinary Tract: Adrenal glands are unremarkable. Kidneys are normal, without renal calculi, focal lesion, or hydronephrosis. There is symmetric excretion on the delayed images. There is mild bladder thickening versus underdistention. Correlate with urinalysis for possible cystitis. Stomach/Bowel: No dilatation or wall thickening. Surgically absent appendix. Colonic diverticulosis without evidence of focal diverticulitis. Vascular/Lymphatic: No significant vascular findings are present. No enlarged abdominal or pelvic lymph nodes. Reproductive: Status post hysterectomy. No adnexal masses. Other: No abdominal wall hernia or abnormality.  No abdominopelvic ascites. No free air or focal inflammatory process is seen. Musculoskeletal: Mild degenerative change lumbar spine. No acute or other significant osseous findings. IMPRESSION: 1. Mild bladder thickening versus underdistention. Correlate with urinalysis for possible cystitis. 2. No evidence of urinary stones or obstruction on the current or prior studies. 3. Colonic diverticulosis without evidence for focal diverticulitis. 4. Mild hepatic steatosis. Electronically Signed   By: Almira Bar M.D.   On: 09/30/2023 21:00    Procedures Procedures    Medications Ordered in ED Medications  iohexol (OMNIPAQUE) 300 MG/ML solution 100 mL (100 mLs Intravenous Contrast Given 09/30/23 1934)  acetaminophen (TYLENOL) tablet 1,000 mg (1,000 mg Oral Given 09/30/23 1943)    ED Course/ Medical Decision Making/ A&P Clinical Course as of 09/30/23 2137  Sun Sep 30, 2023  1937 LLQ abdominal pain + Bloody stool  + 3 weeks.  [CC]    Clinical Course User Index [CC] Glyn Ade, MD  Medical Decision Making Amount and/or Complexity of Data Reviewed Labs: ordered. Radiology: ordered.  Risk OTC drugs. Prescription drug management.   52 y.o. female with pertinent past medical history of IBS, hemorrhoids presents to the ED for concern of intermittent left lower quadrant pain, some blood in her stools for the last 3 weeks  Differential diagnosis includes but is not limited to hemorrhoid, diverticulitis, anal fissure, appendicitis, SBO, malignancy, UTI, IBS, gastric ulcer  ED Course:  Patient overall well-appearing, normal vital signs.  She does not have any tenderness palpation of the abdomen diffusely.  Upon rectal exam, does have a external hemorrhoid that is nonthrombosed.  Hemoccult negative.  CBC without leukocytosis.  Hemoglobin at 11.7 which compared to prior is at her baseline.  CMP without any abnormalities.  Lipase within normal limits.  Given  unremarkable lab work, CT abdomen pelvis was obtained which showed no acute abnormalities to explain her left lower quadrant pain.  Patient denies any dysuria, hematuria, increased frequency, low suspicion for UTI.  Given the pain is worse when eating, this could be related to IBS. I suspect the bleeding she has noticed is secondary to her hemorrhoid as her Hemoccult was negative. Patient given Tylenol for pain Upon re-evaluation, patient reports pain is better. Vital signs stable. Appropriate for discharge home at this time with follow-up with her primary care provider for further monitoring of her symptoms.  Impression: Intermittent left lower quadrant pain for 3 weeks  Disposition:  The patient was discharged home with instructions to follow-up with PCP if symptoms do not start to improve within the next week.  I instructed patient on refraining from straining when using the restroom to help prevent further hemorrhoids, use of laxative such as MiraLAX to help soften stools.  Return precautions given.  Lab Tests: I Ordered, and personally interpreted labs.  The pertinent results include:   CBC without any leukocytosis.  Hemoglobin at 11.7, this seems consistent with her baseline CMP unremarkable Lipase within normal limits Hemoccult negative  Imaging Studies ordered: I ordered imaging studies including CT abdomen pelvis I independently visualized the imaging with scope of interpretation limited to determining acute life threatening conditions related to emergency care. Imaging showed no acute abnormalities, diverticulosis without diverticulitis I agree with the radiologist interpretation  Co morbidities that complicate the patient evaluation  IBS, hemorrhoids            Final Clinical Impression(s) / ED Diagnoses Final diagnoses:  Left lower quadrant pain    Rx / DC Orders ED Discharge Orders     None         Arabella Merles, Cordelia Poche 09/30/23 2137     Glyn Ade, MD 09/30/23 [phone]

## 2023-09-30 NOTE — Discharge Instructions (Addendum)
Your CT scan shows no concerning abnormalities to explain your symptoms.  The results are as below:  IMPRESSION: 1. Mild bladder thickening versus underdistention. Correlate with urinalysis for possible cystitis. 2. No evidence of urinary stones or obstruction on the current or prior studies. 3. Colonic diverticulosis without evidence for focal diverticulitis. 4. Mild hepatic steatosis.  We checked your electrolytes today, kidney function, liver function, pancreas enzyme, and blood counts which all were normal.  Your hemoglobin was slightly low at 11.7, however, this seems consistent with your baseline.  I suspect that the blood you are noticing is due to your hemorrhoid.  Please avoid straining when using the restroom.  You may use over-the-counter laxative such as MiraLAX to help you have normal bowel movements.  Please follow-up with your PCP if symptoms do not start to improve within the next 1 to 2 weeks.  Return to the ER if you develop any dizziness, shortness of breath, black stools, severe worsening of your abdominal pain, any other new or concerning symptoms.

## 2023-10-01 NOTE — Patient Instructions (Signed)
Left voice message informing pt to arrive at 0715 in the morning on the 24 of Oct.

## 2023-10-02 ENCOUNTER — Ambulatory Visit: Payer: BC Managed Care – PPO | Admitting: Family Medicine

## 2023-10-02 ENCOUNTER — Encounter: Payer: Self-pay | Admitting: Family Medicine

## 2023-10-02 VITALS — BP 124/76 | HR 89 | Temp 98.7°F | Ht 60.0 in | Wt 179.4 lb

## 2023-10-02 DIAGNOSIS — K5792 Diverticulitis of intestine, part unspecified, without perforation or abscess without bleeding: Secondary | ICD-10-CM | POA: Diagnosis not present

## 2023-10-02 MED ORDER — METRONIDAZOLE 500 MG PO TABS
500.0000 mg | ORAL_TABLET | Freq: Two times a day (BID) | ORAL | 0 refills | Status: AC
Start: 2023-10-02 — End: 2023-10-12

## 2023-10-02 MED ORDER — CIPROFLOXACIN HCL 500 MG PO TABS: 500.0000 mg | ORAL_TABLET | Freq: Two times a day (BID) | ORAL | 0 refills | Status: AC

## 2023-10-02 NOTE — Progress Notes (Signed)
Subjective:    Patient ID: Andrea [name], female    DOB: 01-20-1971, 52 y.o.   MRN: [id number]  Patient went to the emergency room Saturday with left lower quadrant abdominal pain.  She was also having significant blood in her stool.  She has a picture today showing bloody water in the toilet.  Lab work was unremarkable.  Hemoglobin was 11.7.  CT scan showed diverticulosis and a moderate stool burden.  Patient continues to have left lower quadrant abdominal pain and occasional blood in stool.  The blood is not as severe as Saturday but the pain has worsened. Past Medical History:  Diagnosis Date   Abnormal uterine bleeding    Pt follows with Ellison Hughs, MD. She is scheduled for a hysterectomy on 09/07/22.   Angiolipoma of kidney    around 8 years ago per pt on 08/29/22, benign   Anxiety    follows w/ PCP Dr. Tanya Nones, LOV 07/07/22   Arthritis    "hands" (09/02/2013), neck   Asthma    seasonal, follows with PCP, Dr. Tanya Nones   B12 deficiency    Cervical polyp 2023   Epicondylitis, lateral    Right   Fibromyalgia    sees PCP, Dr. Tanya Nones, LOV 07/07/22 in Epic   GERD (gastroesophageal reflux disease)    History of recurrent UTIs    Hx of multiple UTIs. As of 08/23/22, most recent UTI on 08/16/22 treated with Macrobid 100 mg bid x 7 days. Urine culture negative. See OV notes from Kurtis Bushman, FNP in [place], dated 08/16/22 & 08/18/22.   IBS (irritable bowel syndrome)    .   Migraine headache    Pt takes Emgality monthly injections and follows with PCP, Dr. Lynnea Ferrier @ [facility].   Palpitations 06/2022   Hx of heart palpitations, PVCs. Pt had increased heart palpitations in 06/2022 during a urinary tract infection and fever. 06/13/22 EKG in Epic.   UTI (urinary tract infection)    Past Surgical History:  Procedure Laterality Date   APPENDECTOMY  12/11/1982   CATARACT EXTRACTION Bilateral 2017   COLONOSCOPY  06/05/2022   One 7 mm sessile sigmoid polyp   CYSTOSCOPY  N/A 09/07/2022   Procedure: CYSTOSCOPY;  Surgeon: Carlisle Cater, MD;  Location: [facility];  Service: Gynecology;  Laterality: N/A;   HYSTEROSCOPY  11/2021   LAPAROSCOPIC BILATERAL SALPINGECTOMY Bilateral 09/07/2022   Procedure: LAPAROSCOPIC BILATERAL SALPINGECTOMY;  Surgeon: Carlisle Cater, MD;  Location: [facility];  Service: Gynecology;  Laterality: Bilateral;   LAPAROSCOPIC SUPRACERVICAL HYSTERECTOMY N/A 09/07/2022   Procedure: ABORTED TOTAL LAPAROSCOPIC HYSTERECTOMY / laparoscopic assisted vaginal hysterectomy;  Surgeon: Carlisle Cater, MD;  Location: [facility] [place];  Service: Gynecology;  Laterality: N/A;   PALATAL EXPANSION  12/11/2002   REDUCTION MAMMAPLASTY Bilateral ~ 2009   Current Outpatient Medications on File Prior to Visit  Medication Sig Dispense Refill   albuterol (VENTOLIN HFA) 108 (90 Base) MCG/ACT inhaler INHALE 2 PUFFS INTO THE LUNGS EVERY 6 HOURS AS NEEDED FOR WHEEZE OR SHORTNESS OF BREATH 8.5 each 2   ALPRAZolam (XANAX) 0.5 MG tablet Take 1 tablet (0.5 mg total) by mouth 3 (three) times daily as needed for anxiety. 30 tablet 0   cyclobenzaprine (FLEXERIL) 10 MG tablet TAKE 1 TABLET BY MOUTH EVERY 8 HOURS AS NEEDED FOR MUSCLE SPASMS 60 tablet 0   diclofenac (VOLTAREN) 75 MG EC tablet Take 75 mg by mouth 2 (two) times daily as needed.  docusate sodium (COLACE) 100 MG capsule Take 200 mg by mouth daily as needed for mild constipation.     EMGALITY 120 MG/ML SOAJ SMARTSIG:120 Milligram(s) SUB-Q Once a Month 1.12 mL 3   linaclotide (LINZESS) 290 MCG CAPS capsule TAKE 1 CAPSULE BY MOUTH DAILY BEFORE BREAKFAST. 90 capsule 1   oxyCODONE-acetaminophen (PERCOCET/ROXICET) 5-325 MG tablet Take 1 tablet by mouth as needed for severe pain (for migraines). 30 tablet 0   promethazine (PHENERGAN) 25 MG tablet TAKE 1 TABELT BY MOUTH EVERY 4 HOURS 20 tablet 1   sodium chloride (OCEAN) 0.65 % SOLN nasal spray Place 1 spray into both  nostrils as needed for congestion.     Current Facility-Administered Medications on File Prior to Visit  Medication Dose Route Frequency Provider Last Rate Last Admin   betamethasone acetate-betamethasone sodium phosphate (CELESTONE) injection 3 mg  3 mg Intra-articular Once Felecia Shelling, DPM       Allergies  Allergen Reactions   Cefdinir Hives   Prednisone Rash     Only when takes in large doses   Macrolides And Ketolides     Headache,shortness of breath, heavy chest   Ceftin [Cefuroxime Axetil] Rash   Sulfa Antibiotics Rash   Social History   Socioeconomic History   Marital status: Married    Spouse name: Not on file   Number of children: Not on file   Years of education: Not on file   Highest education level: Associate degree: occupational, Scientist, product/process development, or vocational program  Occupational History   Occupation: Midwife  Tobacco Use   Smoking status: Never    Passive exposure: Past   Smokeless tobacco: Never  Vaping Use   Vaping status: Never Used  Substance and Sexual Activity   Alcohol use: No   Drug use: No   Sexual activity: Not on file  Other Topics Concern   Not on file  Social History Narrative   Not on file   Social Determinants of Health   Financial Resource Strain: Low Risk  (09/27/2023)   Overall Financial Resource Strain (CARDIA)    Difficulty of Paying Living Expenses: Not hard at all  Food Insecurity: No Food Insecurity (09/27/2023)   Hunger Vital Sign    Worried About Running Out of Food in the Last Year: Never true    Ran Out of Food in the Last Year: Never true  Transportation Needs: No Transportation Needs (09/27/2023)   PRAPARE - Administrator, Civil Service (Medical): No    Lack of Transportation (Non-Medical): No  Physical Activity: Unknown (09/27/2023)   Exercise Vital Sign    Days of Exercise per Week: 0 days    Minutes of Exercise per Session: Not on file  Stress: No Stress Concern Present (09/27/2023)    [employer] of Occupational Health - Occupational Stress Questionnaire    Feeling of Stress : Only a little  Social Connections: Unknown (09/27/2023)   Social Connection and Isolation Panel [NHANES]    Frequency of Communication with Friends and Family: More than three times a week    Frequency of Social Gatherings with Friends and Family: Twice a week    Attends Religious Services: Patient declined    Database administrator or Organizations: No    Attends Engineer, structural: Not on file    Marital Status: Married  Catering manager Violence: Not on file      Review of Systems  All other systems reviewed and are negative.  Objective:   Physical Exam Vitals reviewed.  Constitutional:      General: She is not in acute distress.    Appearance: She is normal weight. She is not ill-appearing, toxic-appearing or diaphoretic.  Cardiovascular:     Rate and Rhythm: Normal rate and regular rhythm.     Heart sounds: Normal heart sounds. No murmur heard.    No friction rub. No gallop.  Pulmonary:     Effort: Pulmonary effort is normal. No respiratory distress.     Breath sounds: Normal breath sounds. No wheezing or rales.  Abdominal:     General: Abdomen is flat. Bowel sounds are normal.     Tenderness: There is abdominal tenderness.    Neurological:     Mental Status: She is alert.           Assessment & Plan:  Diverticulitis I believe the patient is developing diverticulitis.  This would explain the left lower quadrant abdominal pain and the bloody stool.  Recommended Cipro 500 mg p.o. twice daily for 10 days with Flagyl 500 mg p.o. twice daily for 10 days.  Use MiraLAX twice daily to soften stool and reduce constipation.  Recheck if worsening immediately

## 2023-10-03 ENCOUNTER — Ambulatory Visit: Payer: BC Managed Care – PPO | Admitting: Rheumatology

## 2023-10-08 ENCOUNTER — Ambulatory Visit
Admit: 2023-10-08 | Discharge: 2023-10-08 | Payer: PRIVATE HEALTH INSURANCE | Attending: Registered" | Primary: Family Medicine

## 2023-10-08 NOTE — Progress Notes (Signed)
Tree surgeon at [facility]  Supervised Weight Loss     Date:   10/08/2023    Patient's Name: Leah Ruiz  DOB: June 04, 1971    Insurance:  Monia Pouch            Session: 3 of  6  Surgery: Gastric Bypass vs Sleeve Gastrectomy               Surgeon:  Garen Lah, M.D.      Height: 6'                    Previous Weight:    348      Lbs.                               BMI: 47             Pounds Lost since last month: 0               Pounds Gained since last month: 0     Starting Weight: 348#                       Previous Month's Weight: 348#  Overall Pounds Lost: 0                    Overall Pounds Gained: 0     Other Pertinent Information: Today's appointment was completed over the phone.  Pt has been recommended to lose 25# prior to surgery. Pt has not weighed since initial consult. Today's appointment was originally scheduled as an office visit to obtain wt, but pt rescheduled to phone appointment.      Smoking Status:  none  Alcohol Intake: none    I have reviewed with pt the guidelines of the supervised wt loss program.  Pt understands the expectations of some wt loss during the program and that wt gain could delay the process. I have also explained that appointments need to be consecutive and missing an appointment may result in starting over. Pt has received this information in writing.          Changes that patient has made since last month include:  healthier methods of cooking, drinking more water.      Eating Habits and Behaviors  General healthy eating guidelines were also discussed. Pts were instructed that their plate should be made up 1/2 plate coming from non-starchy vegetables, 1/4 coming from lean meat, and 1/4 of their plate coming from carbohydrates, including fruits, starches, or milk. We discussed measuring meals to 1/2 cup total per meal after surgery. Drinking only calorie-free, sugar-free and non-carbonated beverages. We discussed the importance of drinking 64 ounces  of fluid per day to prevent dehydration post-operatively.                       Patient's current diet habits include: Pt is eating 3 meals per day. Drank a Slim Fast for breakfast. Snack choices include pork skins. Pt is eating refined carbohydrate foods (bread, pasta, rice, potatoes) in moderation. Pt is eating sweets/desserts in moderation. Pt is using air fried cooking methods. Pt is eating meals prepared outside of the home 1-3 times per week. Pt is drinking water with Crystal Light. Pt reports no to emotional eating.         Physical Activity/Exercise  An exercise presentation was provided including information  about exercise programs available both before and after surgery. We talked about the importance of increasing daily physical activity and beginning to develop an exercise regimen/routine. We talked about exercise as being an important part of long term weight loss after surgery.     Comments:  During class, I discussed with patient the importance of getting into an exercise routine.  Pt is currently not exercising. Pt has been encouraged to implement intentional exercise or exercise videos.     Behavior Modification       We talked about how to eat more mindfully. Tips and recommendations for how to make these changes were provided. Pt was encouraged to keep a food journal and record what they were taking in daily.         Overall Assessment: Pt demonstrates small lifestyle changes with continued changes indicated. Goals set and recommendations made. RD recommends pt to complete next month's appointment in-office to obtain weight and provide nutrition education materials. Scheduled next visit on same day as psychological evaluation in same office.     Patient-Set Goals:   1. Nutrition - continue to work on 3 meals a day   2. Exercise - walking intervals or You Tube videos   3. Behavior -track fluid intake to 64 oz per day (4 bottles)     Epifanio Lesches, RD  10/08/2023

## 2023-10-14 ENCOUNTER — Other Ambulatory Visit: Payer: Self-pay | Admitting: Family Medicine

## 2023-10-16 NOTE — Telephone Encounter (Signed)
Requested medication (s) are due for refill today: yes  Requested medication (s) are on the active medication list: yes  Last refill:  06/15/23  Future visit scheduled: no  Notes to clinic:  Unable to refill per protocol, cannot delegate.      Requested Prescriptions  Pending Prescriptions Disp Refills   cyclobenzaprine (FLEXERIL) 10 MG tablet [Pharmacy Med Name: CYCLOBENZAPRINE 10 MG TABLET] 60 tablet 0    Sig: TAKE 1 TABLET BY MOUTH EVERY 8 HOURS AS NEEDED FOR MUSCLE SPASM     Not Delegated - Analgesics:  Muscle Relaxants Failed - 10/14/2023  4:29 PM      Failed - This refill cannot be delegated      Failed - Valid encounter within last 6 months    Recent Outpatient Visits           1 year ago Fatigue, unspecified type   [facility] Donita Brooks, MD   2 years ago Palpitations   [facility] Valentino Nose, NP   2 years ago Bronchitis   [facility] Valentino Nose, NP   2 years ago Neck pain   [facility] Tanya Nones, Priscille Heidelberg, MD   2 years ago Fibromyalgia   [facility] Pickard, Priscille Heidelberg, MD

## 2023-10-17 ENCOUNTER — Other Ambulatory Visit: Payer: Self-pay | Admitting: Family Medicine

## 2023-10-22 ENCOUNTER — Encounter: Payer: Self-pay | Admitting: Family Medicine

## 2023-10-22 ENCOUNTER — Ambulatory Visit: Payer: BC Managed Care – PPO | Admitting: Family Medicine

## 2023-10-22 VITALS — BP 124/72 | HR 94 | Temp 98.1°F | Ht 60.0 in | Wt 176.0 lb

## 2023-10-22 DIAGNOSIS — S83242D Other tear of medial meniscus, current injury, left knee, subsequent encounter: Secondary | ICD-10-CM

## 2023-10-22 NOTE — Progress Notes (Signed)
Subjective:    Patient ID: Andrea [name], female    DOB: 04/16/1971, 52 y.o.   MRN: [id number]  Patient has been dealing with left knee pain now for 3 months.  She saw orthopedist 3 months ago who performed an x-ray.  X-ray showed minimal arthritis.  She was given a cortisone injection which helped for about 1 week and then the pain returned.  The pain is located over the anterior medial portion of the knee.  She has a positive Apley grind.  She has significant pain with extension.  She has no laxity to varus or valgus stress.  She has a negative anterior posterior drawer sign.  There is no erythema.  There is no swelling.  She does have some tenderness to palpation over the medial joint line.  Orthopedics wanted to perform an MRI of the knee to evaluate for meniscal tear.  She was hoping that I could schedule this for her Past Medical History:  Diagnosis Date   Abnormal uterine bleeding    Pt follows with Ellison Hughs, MD. She is scheduled for a hysterectomy on 09/07/22.   Angiolipoma of kidney    around 8 years ago per pt on 08/29/22, benign   Anxiety    follows w/ PCP Dr. Tanya Nones, LOV 07/07/22   Arthritis    "hands" (09/02/2013), neck   Asthma    seasonal, follows with PCP, Dr. Tanya Nones   B12 deficiency    Cervical polyp 2023   Epicondylitis, lateral    Right   Fibromyalgia    sees PCP, Dr. Tanya Nones, LOV 07/07/22 in Epic   GERD (gastroesophageal reflux disease)    History of recurrent UTIs    Hx of multiple UTIs. As of 08/23/22, most recent UTI on 08/16/22 treated with Macrobid 100 mg bid x 7 days. Urine culture negative. See OV notes from Kurtis Bushman, FNP in [place], dated 08/16/22 & 08/18/22.   IBS (irritable bowel syndrome)    .   Migraine headache    Pt takes Emgality monthly injections and follows with PCP, Dr. Lynnea Ferrier @ [facility].   Palpitations 06/2022   Hx of heart palpitations, PVCs. Pt had increased heart palpitations in 06/2022 during a urinary tract  infection and fever. 06/13/22 EKG in Epic.   UTI (urinary tract infection)    Past Surgical History:  Procedure Laterality Date   APPENDECTOMY  12/11/1982   CATARACT EXTRACTION Bilateral 2017   COLONOSCOPY  06/05/2022   One 7 mm sessile sigmoid polyp   CYSTOSCOPY N/A 09/07/2022   Procedure: CYSTOSCOPY;  Surgeon: Carlisle Cater, MD;  Location: [facility];  Service: Gynecology;  Laterality: N/A;   HYSTEROSCOPY  11/2021   LAPAROSCOPIC BILATERAL SALPINGECTOMY Bilateral 09/07/2022   Procedure: LAPAROSCOPIC BILATERAL SALPINGECTOMY;  Surgeon: Carlisle Cater, MD;  Location: [facility];  Service: Gynecology;  Laterality: Bilateral;   LAPAROSCOPIC SUPRACERVICAL HYSTERECTOMY N/A 09/07/2022   Procedure: ABORTED TOTAL LAPAROSCOPIC HYSTERECTOMY / laparoscopic assisted vaginal hysterectomy;  Surgeon: Carlisle Cater, MD;  Location: [facility];  Service: Gynecology;  Laterality: N/A;   PALATAL EXPANSION  12/11/2002   REDUCTION MAMMAPLASTY Bilateral ~ 2009   Current Outpatient Medications on File Prior to Visit  Medication Sig Dispense Refill   albuterol (VENTOLIN HFA) 108 (90 Base) MCG/ACT inhaler INHALE 2 PUFFS INTO THE LUNGS EVERY 6 HOURS AS NEEDED FOR WHEEZE OR SHORTNESS OF BREATH 8.5 each 2   ALPRAZolam (XANAX) 0.5 MG tablet Take 1 tablet (  0.5 mg total) by mouth 3 (three) times daily as needed for anxiety. 30 tablet 0   cyclobenzaprine (FLEXERIL) 10 MG tablet TAKE 1 TABLET BY MOUTH EVERY 8 HOURS AS NEEDED FOR MUSCLE SPASMS 60 tablet 0   diclofenac (VOLTAREN) 75 MG EC tablet Take 75 mg by mouth 2 (two) times daily as needed.     docusate sodium (COLACE) 100 MG capsule Take 200 mg by mouth daily as needed for mild constipation.     EMGALITY 120 MG/ML SOAJ SMARTSIG:120 Milligram(s) SUB-Q Once a Month 1.12 mL 3   linaclotide (LINZESS) 290 MCG CAPS capsule TAKE 1 CAPSULE BY MOUTH DAILY BEFORE BREAKFAST. 90 capsule 1   oxyCODONE-acetaminophen  (PERCOCET/ROXICET) 5-325 MG tablet Take 1 tablet by mouth as needed for severe pain (for migraines). 30 tablet 0   promethazine (PHENERGAN) 25 MG tablet TAKE 1 TABELT BY MOUTH EVERY 4 HOURS 20 tablet 1   sodium chloride (OCEAN) 0.65 % SOLN nasal spray Place 1 spray into both nostrils as needed for congestion.     Current Facility-Administered Medications on File Prior to Visit  Medication Dose Route Frequency Provider Last Rate Last Admin   betamethasone acetate-betamethasone sodium phosphate (CELESTONE) injection 3 mg  3 mg Intra-articular Once Felecia Shelling, DPM       Allergies  Allergen Reactions   Cefdinir Hives   Prednisone Rash     Only when takes in large doses   Macrolides And Ketolides     Headache,shortness of breath, heavy chest   Ceftin [Cefuroxime Axetil] Rash   Sulfa Antibiotics Rash   Social History   Socioeconomic History   Marital status: Married    Spouse name: Not on file   Number of children: Not on file   Years of education: Not on file   Highest education level: Associate degree: occupational, Scientist, product/process development, or vocational program  Occupational History   Occupation: Midwife  Tobacco Use   Smoking status: Never    Passive exposure: Past   Smokeless tobacco: Never  Vaping Use   Vaping status: Never Used  Substance and Sexual Activity   Alcohol use: No   Drug use: No   Sexual activity: Not on file  Other Topics Concern   Not on file  Social History Narrative   Not on file   Social Determinants of Health   Financial Resource Strain: Low Risk  (09/27/2023)   Overall Financial Resource Strain (CARDIA)    Difficulty of Paying Living Expenses: Not hard at all  Food Insecurity: No Food Insecurity (09/27/2023)   Hunger Vital Sign    Worried About Running Out of Food in the Last Year: Never true    Ran Out of Food in the Last Year: Never true  Transportation Needs: No Transportation Needs (09/27/2023)   PRAPARE - Scientist, research (physical sciences) (Medical): No    Lack of Transportation (Non-Medical): No  Physical Activity: Unknown (09/27/2023)   Exercise Vital Sign    Days of Exercise per Week: 0 days    Minutes of Exercise per Session: Not on file  Stress: No Stress Concern Present (09/27/2023)   [employer] of Occupational Health - Occupational Stress Questionnaire    Feeling of Stress : Only a little  Social Connections: Unknown (09/27/2023)   Social Connection and Isolation Panel [NHANES]    Frequency of Communication with Friends and Family: More than three times a week    Frequency of Social Gatherings with Friends and Family: Twice a week  Attends Religious Services: Patient declined    Active Member of Clubs or Organizations: No    Attends Banker Meetings: Not on file    Marital Status: Married  Catering manager Violence: Not on file      Review of Systems  All other systems reviewed and are negative.      Objective:   Physical Exam Vitals reviewed.  Constitutional:      General: She is not in acute distress.    Appearance: She is normal weight. She is not ill-appearing, toxic-appearing or diaphoretic.  Cardiovascular:     Rate and Rhythm: Normal rate and regular rhythm.     Heart sounds: Normal heart sounds. No murmur heard.    No friction rub. No gallop.  Pulmonary:     Effort: Pulmonary effort is normal. No respiratory distress.     Breath sounds: Normal breath sounds. No wheezing or rales.  Abdominal:    Musculoskeletal:     Left knee: Decreased range of motion. Tenderness present over the medial joint line. No LCL laxity or MCL laxity.Abnormal meniscus.     Instability Tests: Anterior drawer test negative. Posterior drawer test negative.  Neurological:     Mental Status: She is alert.           Assessment & Plan:  Acute medial meniscal tear, left, subsequent encounter - Plan: MR Knee Left  Wo Contrast Patient has had 3 months worth of knee pain.  She  has tried and failed a cortisone injection along with NSAIDs.  She has had an x-ray which showed minimal arthritis and certainly does not explain the level of pain that she is having today on exam.  Exam suggest that she has a meniscal tear due to the pain and popping with Apley grind.  Therefore I will schedule the patient for an MRI of the left knee

## 2023-10-23 ENCOUNTER — Other Ambulatory Visit: Payer: Self-pay | Admitting: Family Medicine

## 2023-10-23 NOTE — Patient Instructions (Signed)
 Attempted to complete pat for procedure on 10/25/23. No answer. Detailed voicemail left regarding arrival time of 0645, npo status, and the need for a ride home.

## 2023-10-25 ENCOUNTER — Inpatient Hospital Stay: Payer: Medicaid (Managed Care)

## 2023-10-25 ENCOUNTER — Inpatient Hospital Stay
Admit: 2023-10-25 | Discharge: 2023-10-25 | Disposition: A | Discharge status: Home / Self Care | Payer: PRIVATE HEALTH INSURANCE | Attending: Emergency Medicine

## 2023-10-25 DIAGNOSIS — M545 Low back pain, unspecified: Secondary | ICD-10-CM

## 2023-10-25 MED ORDER — LIDOCAINE 4 % EX PTCH
4 | CUTANEOUS | Status: DC
Start: 2023-10-25 — End: 2023-10-25
  Administered 2023-10-25: 11:00:00 1 via TRANSDERMAL

## 2023-10-25 MED ORDER — LIDOCAINE 5 % EX PTCH
5 | MEDICATED_PATCH | Freq: Every day | CUTANEOUS | 0 refills | Status: AC
Start: 2023-10-25 — End: 2023-11-04

## 2023-10-25 MED ORDER — METHOCARBAMOL 750 MG PO TABS
750 | ORAL_TABLET | Freq: Four times a day (QID) | ORAL | 0 refills | Status: AC | PRN
Start: 2023-10-25 — End: 2023-10-30

## 2023-10-25 MED ORDER — KETOROLAC TROMETHAMINE 30 MG/ML IJ SOLN
30 | INTRAMUSCULAR | Status: AC
Start: 2023-10-25 — End: 2023-10-25
  Administered 2023-10-25: 11:00:00 30 mg via INTRAMUSCULAR

## 2023-10-25 MED FILL — KETOROLAC TROMETHAMINE 30 MG/ML IJ SOLN: 30 MG/ML | INTRAMUSCULAR | Qty: 1

## 2023-10-25 MED FILL — LIDOCAINE PAIN RELIEF 4 % EX PTCH: 4 % | CUTANEOUS | Qty: 1

## 2023-10-25 NOTE — Discharge Instructions (Addendum)
 Thank you for choosing our Emergency Department for your care.  It is our privilege to care for you in your time of need.  In the next several days, you may receive a survey via email or mailed to your home about your experience with our team.  We woul

## 2023-10-25 NOTE — Telephone Encounter (Signed)
 Returning patients call to reschedule her EGD from today.    LM for patient to call me back to reschedule her endo

## 2023-10-25 NOTE — ED Triage Notes (Signed)
 Right lower back/hip pain started 2 days ago. Worse with certain movements

## 2023-10-25 NOTE — Telephone Encounter (Signed)
 Patient called to schedule EGD. Patient can be reached at [phone]

## 2023-10-25 NOTE — ED Provider Notes (Signed)
 [facility] EMERGENCY DEPT  EMERGENCY DEPARTMENT HISTORY AND PHYSICAL EXAM      Date: 10/25/2023  Patient Name: Leah Ruiz  MRN: [id number]  Birthdate March 11, 1971  Date of evaluation: 10/25/2023  Provider: Cecil Cranker, MD   Note Started: 6:04 AM EST

## 2023-10-26 ENCOUNTER — Telehealth: Payer: Self-pay

## 2023-10-26 ENCOUNTER — Other Ambulatory Visit: Payer: Self-pay | Admitting: Family Medicine

## 2023-10-26 MED ORDER — CYCLOBENZAPRINE HCL 10 MG PO TABS: ORAL_TABLET | ORAL | 0 refills | Status: DC

## 2023-10-26 NOTE — Telephone Encounter (Signed)
Copied from CRM [id number]. Topic: Clinical - Prescription Issue >> Oct 26, 2023  4:05 PM Alcus Dad H wrote: Reason for CRM: Pt's has put in requests through MyChart multiple times for cyclobenzaprine (FLEXERIL) 10 MG tablet, says she is almost out and has been skipping to maintain the last pills she has.\  Sent to Dr Tanya Nones for RF

## 2023-10-29 ENCOUNTER — Other Ambulatory Visit: Payer: Self-pay

## 2023-10-29 ENCOUNTER — Telehealth: Payer: Self-pay

## 2023-10-29 DIAGNOSIS — G43829 Menstrual migraine, not intractable, without status migrainosus: Secondary | ICD-10-CM

## 2023-10-29 MED ORDER — EMGALITY 120 MG/ML ~~LOC~~ SOAJ: SUBCUTANEOUS | 3 refills | Status: DC

## 2023-10-29 NOTE — Telephone Encounter (Signed)
 Returning patients call to reschedule endoscopy with Dr Carol Ada.      ATC for patient to call me to re-schedule endoscopy with Dr Carol Ada.  VM not set up

## 2023-10-29 NOTE — Telephone Encounter (Signed)
Prescription Request  10/29/2023  LOV: 10/02/23  What is the name of the medication or equipment? EMGALITY 120 MG/ML SOAJ [[id number]]  Have you contacted your pharmacy to request a refill? Yes   Which pharmacy would you like this sent to?  [id number] [place], [place] - [address] [place] [address] Phone: [phone] Fax: [phone]    Patient notified that their request is being sent to the clinical staff for review and that they should receive a response within 2 business days.   Please advise at [facility] [phone]

## 2023-10-30 ENCOUNTER — Encounter: Admit: 2023-10-30

## 2023-10-30 DIAGNOSIS — Z01818 Encounter for other preprocedural examination: Secondary | ICD-10-CM

## 2023-10-30 NOTE — Telephone Encounter (Signed)
 Patient left VM requesting to reschedule endoscopy. She can be reached at [phone]

## 2023-10-30 NOTE — Telephone Encounter (Signed)
 Returning patients call to re-schedule endoscopy with Dr Levora Angel to patient to schedule her EGD with Dr Carol Ada at [facility].  I offered 12/26 @ 8:30am with arrival time of 7:00am and she accepted.    I let the patient know they are required to

## 2023-11-02 ENCOUNTER — Ambulatory Visit
Admission: RE | Admit: 2023-11-02 | Discharge: 2023-11-02 | Disposition: A | Discharge status: Home / Self Care | Payer: BC Managed Care – PPO | Source: Ambulatory Visit | Attending: Family Medicine | Admitting: Family Medicine

## 2023-11-02 DIAGNOSIS — S83242D Other tear of medial meniscus, current injury, left knee, subsequent encounter: Secondary | ICD-10-CM

## 2023-11-05 ENCOUNTER — Encounter: Admit: 2023-11-05 | Payer: PRIVATE HEALTH INSURANCE | Admitting: Registered" | Primary: Family Medicine

## 2023-11-05 ENCOUNTER — Encounter: Admit: 2023-11-05 | Payer: PRIVATE HEALTH INSURANCE | Admitting: Professional | Primary: Family Medicine

## 2023-11-05 ENCOUNTER — Encounter: Payer: Self-pay | Admitting: Family Medicine

## 2023-11-05 ENCOUNTER — Other Ambulatory Visit: Payer: Self-pay | Admitting: Family Medicine

## 2023-11-05 DIAGNOSIS — F50811 Binge eating disorder, moderate: Principal | ICD-10-CM

## 2023-11-05 MED ORDER — FLUCONAZOLE 150 MG PO TABS: 150.0000 mg | ORAL_TABLET | Freq: Once | ORAL | 0 refills | Status: AC

## 2023-11-05 NOTE — Progress Notes (Signed)
 [facility] at [facility]  Supervised Weight Loss     Date:   11/05/2023    Patient's Name: Leah Ruiz  DOB: 1971-07-26    Insurance:  Monia Pouch            Session: 4 of  6  Surgery: Gastric Bypass vs Sleeve Gastrectomy

## 2023-11-05 NOTE — Progress Notes (Signed)
 [facility]  Bariatric Psychosocial Evaluation - Part 1    This note will not be viewable in MyChart for the following reason(s). This is a Psychotherapy Note. [facility] Health Providers Only)    Date of Intake:  11/05/2023   Start Ti

## 2023-11-06 ENCOUNTER — Other Ambulatory Visit: Payer: Self-pay | Admitting: Family Medicine

## 2023-11-09 ENCOUNTER — Encounter: Payer: Self-pay | Admitting: Family Medicine

## 2023-11-15 ENCOUNTER — Other Ambulatory Visit: Payer: Self-pay | Admitting: Family Medicine

## 2023-11-16 ENCOUNTER — Telehealth: Payer: Self-pay

## 2023-11-16 MED ORDER — OXYCODONE-ACETAMINOPHEN 5-325 MG PO TABS: 1.0000 | ORAL_TABLET | ORAL | 0 refills | Status: DC | PRN

## 2023-11-16 MED ORDER — ALPRAZOLAM 0.5 MG PO TABS: 0.5000 mg | ORAL_TABLET | Freq: Three times a day (TID) | ORAL | 0 refills | Status: DC | PRN

## 2023-11-16 NOTE — Telephone Encounter (Signed)
Copied from CRM [id number]. Topic: Clinical - Medication Refill >> Nov 16, 2023  9:53 AM Amy B wrote: Most Recent Primary Care Visit:  Provider: Lynnea Ferrier T  Department: [facility]  Visit Type: OFFICE VISIT  Date: 10/22/2023  Medication: ALPRAZolam (XANAX) 0.5 MG tablet; oxyCODONE-acetaminophen (PERCOCET/ROXICET) 5-325 MG tablet  Has the patient contacted their pharmacy? Yes.  Pharmacy has tried reaching out to clinic with no response.  Patient has also sent requests through Mychart since November with no response.   (Agent: If no, request that the patient contact the pharmacy for the refill. If patient does not wish to contact the pharmacy document the reason why and proceed with request.) (Agent: If yes, when and what did the pharmacy advise?)  Is this the correct pharmacy for this prescription? Yes If no, delete pharmacy and type the correct one.  This is the patient's preferred pharmacy:  [id number] [place], [place] - [address] [place] [address] Phone: [phone] Fax: [phone]   Has the prescription been filled recently? No  Is the patient out of the medication? Yes  Has the patient been seen for an appointment in the last year OR does the patient have an upcoming appointment? Yes  Can we respond through MyChart? Yes  Agent: Please be advised that Rx refills may take up to 3 business days. We ask that you follow-up with your pharmacy.

## 2023-11-21 ENCOUNTER — Telehealth: Payer: Self-pay

## 2023-11-21 NOTE — Telephone Encounter (Signed)
Copied from CRM [id number]. Topic: Referral - Status >> Nov 21, 2023 12:08 PM Fuller Mandril wrote: Reason for CRM: Pt called stated she was told she would hear back about setting up appt with orthopedics. Would like to know next steps for that. Thank You.

## 2023-11-23 ENCOUNTER — Telehealth: Payer: Self-pay

## 2023-11-23 NOTE — Telephone Encounter (Signed)
Copied from CRM [id number]. Topic: General - Other >> Nov 23, 2023  4:38 PM Alvino Blood C wrote: Reason for CRM: PT is calling back to get more info on the consultation she should be having with orthopedics. PT would like a call back at [phone] to provide her with the next steps.

## 2023-11-26 ENCOUNTER — Other Ambulatory Visit: Payer: Self-pay

## 2023-11-26 DIAGNOSIS — S83242D Other tear of medial meniscus, current injury, left knee, subsequent encounter: Secondary | ICD-10-CM

## 2023-11-27 NOTE — Patient Instructions (Signed)
 Left a detailed message for the patient in regards to EGD scheduled for 12/26. Patient needs to arrive at 0700.  Orders to be placed by Dr. Carol Ada.

## 2023-11-28 ENCOUNTER — Encounter: Payer: PRIVATE HEALTH INSURANCE | Primary: Family Medicine

## 2023-11-30 ENCOUNTER — Encounter: Payer: PRIVATE HEALTH INSURANCE | Attending: Registered" | Primary: Family Medicine

## 2023-12-05 NOTE — H&P (Signed)
 General Surgery H&P    Chief complaint:  Evaluation for bariatric surgery      History of Present Illness:         Leah Ruiz is a new patient seeking surgical treatment for morbid obesity.       is a new patient seeking surgical treatment for morbid

## 2023-12-06 ENCOUNTER — Inpatient Hospital Stay: Payer: Medicaid (Managed Care)

## 2023-12-07 ENCOUNTER — Ambulatory Visit: Payer: BC Managed Care – PPO | Admitting: Orthopaedic Surgery

## 2023-12-09 NOTE — Progress Notes (Unsigned)
Office Visit Note   Patient: Andrea [name]           Date of Birth: 01-Sep-1971           MRN: [id number] Visit Date: 12/10/2023              Requested by: Andrea Brooks, MD [id number] [place],  [place] [address] PCP: Andrea Brooks, MD   Assessment & Plan: Visit Diagnoses:  1. Primary osteoarthritis of left knee     Plan: MRI of the left knee reviewed with the patient in detail which shows a focal area of grade IV chondromalacia of the medial tibial plateau and associated subchondral cyst.  No obvious meniscal pathology.  Her symptoms seem to be consistent with MRI findings.  Treatment options were discussed and she would like to try viscosupplementation.  Will go ahead and get authorization.  Follow-up after we get authorization.  This patient is diagnosed with osteoarthritis of the knee(s).    Radiographs show evidence of joint space narrowing, osteophytes, subchondral sclerosis and/or subchondral cysts.  This patient has knee pain which interferes with functional and activities of daily living.    This patient has experienced inadequate response, adverse effects and/or intolerance with conservative treatments such as acetaminophen, NSAIDS, topical creams, physical therapy or regular exercise, knee bracing and/or weight loss.   This patient has experienced inadequate response or has a contraindication to intra articular steroid injections for at least 3 months.   This patient is not scheduled to have a total knee replacement within 6 months of starting treatment with viscosupplementation.  Follow-Up Instructions: No follow-ups on file.   Orders:  No orders of the defined types were placed in this encounter.  No orders of the defined types were placed in this encounter.     Procedures: No procedures performed   Clinical Data: No additional findings.   Subjective: Chief Complaint  Patient presents with   Left Knee - Pain    HPI Ms. Andrea [name] is  a 52 year old female here for evaluation of chronic left knee pain for 4 months without any known injuries.  Went to [facility] and got a cortisone shot about 3 months ago which only helped for a few days.  MRI was subsequently obtained and she is here for MRI review and discussion of treatment options.  Her pain is worse with prolonged standing with occasional buckling at night pain.  Pain is not constant. Review of Systems  Constitutional: Negative.   HENT: Negative.    Eyes: Negative.   Respiratory: Negative.    Cardiovascular: Negative.   Endocrine: Negative.   Musculoskeletal: Negative.   Neurological: Negative.   Hematological: Negative.   Psychiatric/Behavioral: Negative.    All other systems reviewed and are negative.    Objective: Vital Signs: LMP  (LMP Unknown)   Physical Exam Vitals and nursing note reviewed.  Constitutional:      Appearance: She is well-developed.  HENT:     Head: Normocephalic and atraumatic.     Nose: Nose normal.  Eyes:     Extraocular Movements: Extraocular movements intact.  Cardiovascular:     Pulses: Normal pulses.  Pulmonary:     Effort: Pulmonary effort is normal.  Abdominal:     Palpations: Abdomen is soft.  Musculoskeletal:     Cervical back: Neck supple.  Skin:    General: Skin is warm.     Capillary Refill: Capillary refill takes less than 2 seconds.  Neurological:  Mental Status: She is alert and oriented to person, place, and time. Mental status is at baseline.  Psychiatric:        Behavior: Behavior normal.        Thought Content: Thought content normal.        Judgment: Judgment normal.    Ortho Exam Exam of the left knee shows medial joint line tenderness and a trace effusion.  Normal range of motion. Specialty Comments:  No specialty comments available.  Imaging: No results found.   PMFS History: Patient Active Problem List   Diagnosis Date Noted   Primary osteoarthritis of left knee 12/10/2023   Andrea [name]  Quervain's disease (radial styloid tenosynovitis) 10/12/2022   Hyperglycemia 08/15/2021   Class 2 obesity due to excess calories with body mass index (BMI) of 35.0 to 35.9 in adult 08/15/2021   Lateral epicondylitis of right elbow 08/26/2018   Trigger middle finger of right hand 08/26/2018   Irritable bowel syndrome 06/29/2018   Fibromyalgia 08/15/2017   Chest pain with low risk for cardiac etiology 09/02/2013   Family history of early CAD 09/02/2013   B12 deficiency    Past Medical History:  Diagnosis Date   Abnormal uterine bleeding    Pt follows with Ellison Hughs, MD. She is scheduled for a hysterectomy on 09/07/22.   Angiolipoma of kidney    around 8 years ago per pt on 08/29/22, benign   Anxiety    follows w/ PCP Dr. Tanya Nones, LOV 07/07/22   Arthritis    "hands" (09/02/2013), neck   Asthma    seasonal, follows with PCP, Dr. Tanya Nones   B12 deficiency    Cervical polyp 2023   Epicondylitis, lateral    Right   Fibromyalgia    sees PCP, Dr. Tanya Nones, LOV 07/07/22 in Epic   GERD (gastroesophageal reflux disease)    History of recurrent UTIs    Hx of multiple UTIs. As of 08/23/22, most recent UTI on 08/16/22 treated with Macrobid 100 mg bid x 7 days. Urine culture negative. See OV notes from Kurtis Bushman, FNP in [place], dated 08/16/22 & 08/18/22.   IBS (irritable bowel syndrome)    .   Migraine headache    Pt takes Emgality monthly injections and follows with PCP, Dr. Lynnea Ferrier @ [facility].   Palpitations 06/2022   Hx of heart palpitations, PVCs. Pt had increased heart palpitations in 06/2022 during a urinary tract infection and fever. 06/13/22 EKG in Epic.   UTI (urinary tract infection)     Family History  Problem Relation Age of Onset   COPD Mother    Lung cancer Mother    Coronary artery disease Father 42       MI   Heart attack Father    Alcoholism Father    Cirrhosis Father    Diabetes Sister    Irregular heart beat Sister    Arrhythmia Paternal Uncle     Congestive Heart Failure Paternal Grandmother    Healthy Daughter    Healthy Daughter     Past Surgical History:  Procedure Laterality Date   APPENDECTOMY  12/11/1982   CATARACT EXTRACTION Bilateral 2017   COLONOSCOPY  06/05/2022   One 7 mm sessile sigmoid polyp   CYSTOSCOPY N/A 09/07/2022   Procedure: CYSTOSCOPY;  Surgeon: Carlisle Cater, MD;  Location: [facility];  Service: Gynecology;  Laterality: N/A;   HYSTEROSCOPY  11/2021   LAPAROSCOPIC BILATERAL SALPINGECTOMY Bilateral 09/07/2022   Procedure: LAPAROSCOPIC BILATERAL SALPINGECTOMY;  Surgeon:  Shivaji, Valerie Roys, MD;  Location: [facility];  Service: Gynecology;  Laterality: Bilateral;   LAPAROSCOPIC SUPRACERVICAL HYSTERECTOMY N/A 09/07/2022   Procedure: ABORTED TOTAL LAPAROSCOPIC HYSTERECTOMY / laparoscopic assisted vaginal hysterectomy;  Surgeon: Carlisle Cater, MD;  Location: [facility] ;  Service: Gynecology;  Laterality: N/A;   PALATAL EXPANSION  12/11/2002   REDUCTION MAMMAPLASTY Bilateral ~ 2009   Social History   Occupational History   Occupation: Midwife  Tobacco Use   Smoking status: Never    Passive exposure: Past   Smokeless tobacco: Never  Vaping Use   Vaping status: Never Used  Substance and Sexual Activity   Alcohol use: No   Drug use: No   Sexual activity: Not on file

## 2023-12-10 ENCOUNTER — Telehealth: Payer: Self-pay

## 2023-12-10 ENCOUNTER — Encounter: Payer: Self-pay | Admitting: Orthopaedic Surgery

## 2023-12-10 ENCOUNTER — Ambulatory Visit: Payer: BC Managed Care – PPO | Admitting: Orthopaedic Surgery

## 2023-12-10 DIAGNOSIS — M1712 Unilateral primary osteoarthritis, left knee: Secondary | ICD-10-CM

## 2023-12-10 DIAGNOSIS — G8929 Other chronic pain: Secondary | ICD-10-CM

## 2023-12-10 NOTE — Telephone Encounter (Signed)
Please precert for left knee visco injections. Dr.Xu's patient.

## 2023-12-17 ENCOUNTER — Other Ambulatory Visit: Payer: Self-pay | Admitting: Family Medicine

## 2023-12-19 ENCOUNTER — Encounter: Payer: PRIVATE HEALTH INSURANCE | Attending: Professional | Primary: Family Medicine

## 2023-12-23 ENCOUNTER — Other Ambulatory Visit: Payer: Self-pay | Admitting: Family Medicine

## 2023-12-25 NOTE — Telephone Encounter (Signed)
 Requested medication (s) are due for refill today:   Provider to review  Requested medication (s) are on the active medication list:   Yes  Future visit scheduled:   No.   LOV 10/22/2023 with Dr. Duanne   Last ordered: 11/16/2023 #30, 0 refills  Non delegated refill request reason returned   Requested Prescriptions  Pending Prescriptions Disp Refills   ALPRAZolam  (XANAX ) 0.5 MG tablet [Pharmacy Med Name: ALPRAZOLAM  0.5 MG TABLET] 30 tablet 0    Sig: Take 1 tablet (0.5 mg total) by mouth 3 (three) times daily as needed for anxiety.     Not Delegated - Psychiatry: Anxiolytics/Hypnotics 2 Failed - 12/25/2023 11:11 AM      Failed - This refill cannot be delegated      Failed - Urine Drug Screen completed in last 360 days      Failed - Valid encounter within last 6 months    Recent Outpatient Visits           2 years ago Fatigue, unspecified type   [facility] Pickard, Butler DASEN, MD   2 years ago Palpitations   [facility] Chandra Harlene LABOR, NP   2 years ago Bronchitis   [facility] Chandra Harlene LABOR, NP   2 years ago Neck pain   [facility] Duanne, Butler DASEN, MD   2 years ago Fibromyalgia   [facility] Pickard, Butler DASEN, MD              Passed - Patient is not pregnant

## 2023-12-25 NOTE — Telephone Encounter (Signed)
 VOB submitted for Monovisc, left knee

## 2023-12-26 ENCOUNTER — Encounter: Payer: PRIVATE HEALTH INSURANCE | Primary: Family Medicine

## 2023-12-26 ENCOUNTER — Other Ambulatory Visit: Payer: Self-pay | Admitting: Family Medicine

## 2023-12-26 NOTE — Progress Notes (Deleted)
 Chart review completed all necessary documentation appears to be present.

## 2023-12-27 ENCOUNTER — Encounter: Attending: Registered" | Primary: Family Medicine

## 2023-12-27 MED ORDER — ALPRAZOLAM 0.5 MG PO TABS: 0.5000 mg | ORAL_TABLET | Freq: Three times a day (TID) | ORAL | 0 refills | Status: DC | PRN

## 2023-12-27 NOTE — Telephone Encounter (Signed)
Contacted patient regarding missed appointment with dietitian; no answer; unable to leave voicemail (voicemail box is not set-up)

## 2024-01-01 ENCOUNTER — Telehealth: Payer: Self-pay

## 2024-01-01 DIAGNOSIS — R7989 Other specified abnormal findings of blood chemistry: Secondary | ICD-10-CM | POA: Insufficient documentation

## 2024-01-01 NOTE — Telephone Encounter (Signed)
Copied from CRM [id number]. Topic: General - Billing Inquiry >> Jan 01, 2024  9:56 AM Geroge Baseman wrote: Reason for CRM: July 24th patient had T3, T4 labs done. Procedure codes were [place] as 844[address] ASSAY of Free thyroxine, 30865 Free assay (FT-3). Diagnosis R79.89 Other Specified abnormal findings of blood chemistry.  Olegario Messier states [employer] is deyning the claim, she believes that diagnosis code is too vague. She states that they would need a new claim sent over with correct information for this patients lab visits and findings Marvene Staff, [phone], can ask for Olegario Messier if she does not answer.

## 2024-01-03 ENCOUNTER — Telehealth: Payer: Self-pay

## 2024-01-03 NOTE — Telephone Encounter (Signed)
Faxed completed PA form to [employer] at [phone] for Monovisc, left knee. PA pending 

## 2024-01-04 NOTE — Telephone Encounter (Signed)
Attempted to contact pt regarding remaining requirements for surgery. Unable to LVM.

## 2024-01-11 ENCOUNTER — Other Ambulatory Visit: Payer: Self-pay

## 2024-01-11 ENCOUNTER — Telehealth: Payer: Self-pay

## 2024-01-11 DIAGNOSIS — M1712 Unilateral primary osteoarthritis, left knee: Secondary | ICD-10-CM

## 2024-01-11 NOTE — Telephone Encounter (Signed)
Would like to know if gel injection would affect the cyst that she has behind her knee cap before scheduling for gel injection?  CB# [phone].  Please advise.  Thank you.

## 2024-01-11 NOTE — Telephone Encounter (Signed)
It can sometimes help

## 2024-01-11 NOTE — Telephone Encounter (Signed)
Called and relayed to patient. Scheduled for monovisc.

## 2024-01-17 ENCOUNTER — Encounter: Payer: Self-pay | Admitting: Family Medicine

## 2024-01-17 NOTE — Telephone Encounter (Signed)
 Spoke w/pt today and pt made appt w/Dr. A for tom at 10am.

## 2024-01-18 ENCOUNTER — Encounter: Payer: Self-pay | Admitting: Family Medicine

## 2024-01-18 ENCOUNTER — Ambulatory Visit (INDEPENDENT_AMBULATORY_CARE_PROVIDER_SITE_OTHER): Payer: 59 | Admitting: Family Medicine

## 2024-01-18 VITALS — BP 118/78 | HR 76 | Temp 98.9°F | Ht 60.0 in | Wt 176.5 lb

## 2024-01-18 DIAGNOSIS — G43909 Migraine, unspecified, not intractable, without status migrainosus: Secondary | ICD-10-CM | POA: Insufficient documentation

## 2024-01-18 DIAGNOSIS — J019 Acute sinusitis, unspecified: Secondary | ICD-10-CM

## 2024-01-18 DIAGNOSIS — B9689 Other specified bacterial agents as the cause of diseases classified elsewhere: Secondary | ICD-10-CM | POA: Diagnosis not present

## 2024-01-18 MED ORDER — DOXYCYCLINE HYCLATE 100 MG PO TABS: 100.0000 mg | ORAL_TABLET | Freq: Two times a day (BID) | ORAL | 0 refills | Status: AC

## 2024-01-18 MED ORDER — PREDNISONE 10 MG PO TABS: 10.0000 mg | ORAL_TABLET | Freq: Every day | ORAL | 0 refills | Status: AC

## 2024-01-18 NOTE — Progress Notes (Signed)
 Patient Office Visit  Assessment & Plan:  Acute bacterial sinusitis -     Doxycycline  Hyclate; Take 1 tablet (100 mg total) by mouth 2 (two) times daily.  Dispense: 20 tablet; Refill: 0 -     predniSONE ; Take 1 tablet (10 mg total) by mouth daily with breakfast for 10 days.  Dispense: 10 tablet; Refill: 0  Migraine without status migrainosus, not intractable, unspecified migraine type    Return if symptoms worsen or fail to improve.   Subjective:    Patient ID: Andrea [name], female    DOB: Feb 18, 1971  Age: 53 y.o. MRN: [id number]  Chief Complaint  Patient presents with   Sinus Problem    X 6 days.     Sinus Problem  Sinusitis/migraine headache- Pt sinus pressure for 6 days. Pt also has history migraine headaches. Pt having sinus congestion and sinus pressure and teeth hurting.  Pt has tried OTC Mucinex and Advil . Subjective fever/chills. No tobacco use. 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 Pt has used nasal saline. Pt started having a headache and worries about getting migraine headache.  Patient has been well-controlled with her migraines from 20 to 23/month to 2 or 3.  Pt states pain level is about 7/10 today and has to wear her sunglasses due to photophobia. Patient has been on preventative medication for migraines and takes oxycodone  as needed.  Patient does not like to take oxycodone  because it causes constipation.  Patient has not taken anything for this headache.  Patient believes the sinus infection is triggering this.  Patient is having some nasal purulence.  The 10-year ASCVD risk score (Arnett DK, et al., 2019) is: 1.2%  Past Medical History:  Diagnosis Date   Abnormal uterine bleeding    Pt follows with Lavonia Guppy, MD. She is scheduled for a hysterectomy on 09/07/22.   Angiolipoma of kidney    around 8 years ago per pt on 08/29/22, benign   Anxiety    follows w/ PCP Dr. Duanne, LOV 07/07/22   Arthritis    hands (09/02/2013), neck   Asthma    seasonal, follows  with PCP, Dr. Duanne   B12 deficiency    Cervical polyp 2023   Epicondylitis, lateral    Right   Fibromyalgia    sees PCP, Dr. Duanne, LOV 07/07/22 in Epic   GERD (gastroesophageal reflux disease)    History of recurrent UTIs    Hx of multiple UTIs. As of 08/23/22, most recent UTI on 08/16/22 treated with Macrobid  100 mg bid x 7 days. Urine culture negative. See OV notes from Jeoffrey Barrio, FNP in [place], dated 08/16/22 & 08/18/22.   IBS (irritable bowel syndrome)    .   Migraine headache    Pt takes Emgality  monthly injections and follows with PCP, Dr. Butler Duanne @ [facility].   Palpitations 06/2022   Hx of heart palpitations, PVCs. Pt had increased heart palpitations in 06/2022 during a urinary tract infection and fever. 06/13/22 EKG in Epic.   UTI (urinary tract infection)    Past Surgical History:  Procedure Laterality Date   APPENDECTOMY  12/11/1982   CATARACT EXTRACTION Bilateral 2017   COLONOSCOPY  06/05/2022   One 7 mm sessile sigmoid polyp   CYSTOSCOPY N/A 09/07/2022   Procedure: CYSTOSCOPY;  Surgeon: Guppy Lavonia HERO, MD;  Location: [facility];  Service: Gynecology;  Laterality: N/A;   HYSTEROSCOPY  11/2021   LAPAROSCOPIC BILATERAL SALPINGECTOMY Bilateral 09/07/2022   Procedure: LAPAROSCOPIC BILATERAL SALPINGECTOMY;  Surgeon: Sudie Lavonia HERO, MD;  Location[facility];  Service: Gynecology;  Laterality: Bilateral;   LAPAROSCOPIC SUPRACERVICAL HYSTERECTOMY N/A 09/07/2022   Procedure: ABORTED TOTAL LAPAROSCOPIC HYSTERECTOMY / laparoscopic assisted vaginal hysterectomy;  Surgeon: Sudie Lavonia HERO, MD;  Location: [facility];  Service: Gynecology;  Laterality: N/A;   PALATAL EXPANSION  12/11/2002   REDUCTION MAMMAPLASTY Bilateral ~ 2009   Social History   Tobacco Use   Smoking status: Never    Passive exposure: Past   Smokeless tobacco: Never  Vaping Use   Vaping status: Never Used  Substance Use Topics    Alcohol use: No   Drug use: No   Family History  Problem Relation Age of Onset   COPD Mother    Lung cancer Mother    Coronary artery disease Father 66       MI   Heart attack Father    Alcoholism Father    Cirrhosis Father    Diabetes Sister    Irregular heart beat Sister    Arrhythmia Paternal Uncle    Congestive Heart Failure Paternal Grandmother    Healthy Daughter    Healthy Daughter    Allergies  Allergen Reactions   Cefdinir Hives   Prednisone  Rash     Only when takes in large doses   Macrolides And Ketolides     Headache,shortness of breath, heavy chest   Ceftin [Cefuroxime Axetil] Rash   Sulfa Antibiotics Rash    ROS    Objective:    BP 118/78   Pulse 76   Temp 98.9 F (37.2 C)   Ht 5' (1.524 m)   Wt 176 lb 8 oz (80.1 kg)   LMP  (LMP Unknown)   SpO2 99%   BMI 34.47 kg/m  BP Readings from Last 3 Encounters:  01/18/24 118/78  10/22/23 124/72  10/02/23 124/76   Wt Readings from Last 3 Encounters:  01/18/24 176 lb 8 oz (80.1 kg)  10/22/23 176 lb (79.8 kg)  10/02/23 179 lb 6.4 oz (81.4 kg)    Physical Exam Vitals and nursing note reviewed.  Constitutional:      General: She is not in acute distress.    Appearance: Normal appearance.     Comments: Pt wearing sun glasses due to headache.   HENT:     Head: Normocephalic.     Right Ear: Tympanic membrane, ear canal and external ear normal.     Left Ear: Tympanic membrane, ear canal and external ear normal.     Nose: Congestion present.     Right Sinus: Maxillary sinus tenderness present.     Left Sinus: Maxillary sinus tenderness present.     Comments: Turbinates erythematous.  Eyes:     Extraocular Movements: Extraocular movements intact.     Conjunctiva/sclera: Conjunctivae normal.     Pupils: Pupils are equal, round, and reactive to light.  Cardiovascular:     Rate and Rhythm: Normal rate and regular rhythm.     Heart sounds: Normal heart sounds.  Pulmonary:     Effort: Pulmonary effort  is normal.     Breath sounds: Normal breath sounds.  Musculoskeletal:     Right lower leg: No edema.     Left lower leg: No edema.  Neurological:     General: No focal deficit present.     Mental Status: She is alert and oriented to person, place, and time.  Psychiatric:        Mood and Affect: Mood normal.  Behavior: Behavior normal.        Thought Content: Thought content normal.        Judgment: Judgment normal.      No results found for any visits on 01/18/24.

## 2024-01-28 ENCOUNTER — Other Ambulatory Visit: Payer: Self-pay | Admitting: Family Medicine

## 2024-01-28 MED ORDER — OXYCODONE-ACETAMINOPHEN 5-325 MG PO TABS: 1.0000 | ORAL_TABLET | ORAL | 0 refills | Status: DC | PRN

## 2024-01-28 NOTE — Telephone Encounter (Signed)
Requested medication (s) are due for refill today: Yes  Requested medication (s) are on the active medication list: Yes  Last refill:  12/27/23  Future visit scheduled: No  Notes to clinic:  Unable to refill per protocol, cannot delegate.      Requested Prescriptions  Pending Prescriptions Disp Refills   ALPRAZolam (XANAX) 0.5 MG tablet [Pharmacy Med Name: ALPRAZOLAM 0.5 MG TABLET] 30 tablet 0    Sig: Take 1 tablet (0.5 mg total) by mouth 3 (three) times daily as needed for anxiety.     Not Delegated - Psychiatry: Anxiolytics/Hypnotics 2 Failed - 01/28/2024  4:11 PM      Failed - This refill cannot be delegated      Failed - Urine Drug Screen completed in last 360 days      Failed - Valid encounter within last 6 months    Recent Outpatient Visits           2 years ago Fatigue, unspecified type   [facility] Tanya Nones, Priscille Heidelberg, MD   2 years ago Palpitations   [facility] Valentino Nose, NP   2 years ago Bronchitis   [facility] Valentino Nose, NP   2 years ago Neck pain   [facility] Tanya Nones, Priscille Heidelberg, MD   2 years ago Fibromyalgia   [facility] Pickard, Priscille Heidelberg, MD       Future Appointments             Tomorrow Ivin P[facility] - Patient is not pregnant

## 2024-01-29 ENCOUNTER — Ambulatory Visit (INDEPENDENT_AMBULATORY_CARE_PROVIDER_SITE_OTHER): Payer: 59 | Admitting: Physician Assistant

## 2024-01-29 DIAGNOSIS — M1712 Unilateral primary osteoarthritis, left knee: Secondary | ICD-10-CM

## 2024-01-29 MED ORDER — HYALURONAN 88 MG/4ML IX SOSY
88.0000 mg | PREFILLED_SYRINGE | INTRA_ARTICULAR | Status: AC | PRN
  Administered 2024-01-29: 88 mg via INTRA_ARTICULAR

## 2024-01-29 MED ORDER — LIDOCAINE HCL 1 % IJ SOLN
2.0000 mL | INTRAMUSCULAR | Status: AC | PRN
  Administered 2024-01-29: 2 mL

## 2024-01-29 MED ORDER — BUPIVACAINE HCL 0.25 % IJ SOLN
2.0000 mL | INTRAMUSCULAR | Status: AC | PRN
  Administered 2024-01-29: 2 mL via INTRA_ARTICULAR

## 2024-01-29 NOTE — Progress Notes (Signed)
Office Visit Note   Patient: Andrea [name]           Date of Birth: Apr 01, 1971           MRN: [id number] Visit Date: 01/29/2024              Requested by: Donita Brooks, MD [address] [place],  [place] [address] PCP: Donita Brooks, MD   Assessment & Plan: Visit Diagnoses:  1. Unilateral primary osteoarthritis, left knee     Plan: Impression is chronic left knee pain with grade 4 changes to the medial tibial plateau.  Today, proceeded with left knee Monovisc injection.  She tolerated this well.  Follow-up with [place] as needed.  Follow-Up Instructions: Return if symptoms worsen or fail to improve.   Orders:  Orders Placed This Encounter  Procedures   Large Joint Inj: L knee   No orders of the defined types were placed in this encounter.     Procedures: Large Joint Inj: L knee on 01/29/2024 3:41 PM Indications: pain Details: 22 G needle, anterolateral approach Medications: 2 mL lidocaine 1 %; 2 mL bupivacaine 0.25 %; 88 mg Hyaluronan 88 MG/4ML      Clinical Data: No additional findings.   Subjective: Chief Complaint  Patient presents with   Left Knee - Follow-up    HPI patient is a pleasant 53 year old female here for left knee Monovisc injection.  No previous viscosupplementation injection to the left knee.  Previous cortisone injection provided only temporary relief.     Objective: Vital Signs: LMP  (LMP Unknown)     Ortho Exam stable left knee exam  Specialty Comments:  No specialty comments available.  Imaging: No new imaging   PMFS History: Patient Active Problem List   Diagnosis Date Noted   Migraine headache 01/18/2024   Acute bacterial sinusitis 01/18/2024   Elevated TSH 01/01/2024   Primary osteoarthritis of left knee 12/10/2023   De Quervain's disease (radial styloid tenosynovitis) 10/12/2022   Hyperglycemia 08/15/2021   Class 2 obesity due to excess calories with body mass index (BMI) of 35.0 to 35.9 in adult  08/15/2021   Lateral epicondylitis of right elbow 08/26/2018   Trigger middle finger of right hand 08/26/2018   Irritable bowel syndrome 06/29/2018   Fibromyalgia 08/15/2017   Chest pain with low risk for cardiac etiology 09/02/2013   Family history of early CAD 09/02/2013   B12 deficiency    Past Medical History:  Diagnosis Date   Abnormal uterine bleeding    Pt follows with Ellison Hughs, MD. She is scheduled for a hysterectomy on 09/07/22.   Angiolipoma of kidney    around 8 years ago per pt on 08/29/22, benign   Anxiety    follows w/ PCP Dr. Tanya Nones, LOV 07/07/22   Arthritis    "hands" (09/02/2013), neck   Asthma    seasonal, follows with PCP, Dr. Tanya Nones   B12 deficiency    Cervical polyp 2023   Epicondylitis, lateral    Right   Fibromyalgia    sees PCP, Dr. Tanya Nones, LOV 07/07/22 in Epic   GERD (gastroesophageal reflux disease)    History of recurrent UTIs    Hx of multiple UTIs. As of 08/23/22, most recent UTI on 08/16/22 treated with Macrobid 100 mg bid x 7 days. Urine culture negative. See OV notes from Kurtis Bushman, FNP in [place], dated 08/16/22 & 08/18/22.   IBS (irritable bowel syndrome)    .   Migraine headache  Pt takes Emgality monthly injections and follows with PCP, Dr. Lynnea Ferrier @ [facility].   Palpitations 06/2022   Hx of heart palpitations, PVCs. Pt had increased heart palpitations in 06/2022 during a urinary tract infection and fever. 06/13/22 EKG in Epic.   UTI (urinary tract infection)     Family History  Problem Relation Age of Onset   COPD Mother    Lung cancer Mother    Coronary artery disease Father 88       MI   Heart attack Father    Alcoholism Father    Cirrhosis Father    Diabetes Sister    Irregular heart beat Sister    Arrhythmia Paternal Uncle    Congestive Heart Failure Paternal Grandmother    Healthy Daughter    Healthy Daughter     Past Surgical History:  Procedure Laterality Date   APPENDECTOMY  12/11/1982    CATARACT EXTRACTION Bilateral 2017   COLONOSCOPY  06/05/2022   One 7 mm sessile sigmoid polyp   CYSTOSCOPY N/A 09/07/2022   Procedure: CYSTOSCOPY;  Surgeon: Carlisle Cater, MD;  Location: [facility];  Service: Gynecology;  Laterality: N/A;   HYSTEROSCOPY  11/2021   LAPAROSCOPIC BILATERAL SALPINGECTOMY Bilateral 09/07/2022   Procedure: LAPAROSCOPIC BILATERAL SALPINGECTOMY;  Surgeon: Carlisle Cater, MD;  Location[facility];  Service: Gynecology;  Laterality: Bilateral;   LAPAROSCOPIC SUPRACERVICAL HYSTERECTOMY N/A 09/07/2022   Procedure: ABORTED TOTAL LAPAROSCOPIC HYSTERECTOMY / laparoscopic assisted vaginal hysterectomy;  Surgeon: Carlisle Cater, MD;  Location: [facility];  Service: Gynecology;  Laterality: N/A;   PALATAL EXPANSION  12/11/2002   REDUCTION MAMMAPLASTY Bilateral ~ 2009   Social History   Occupational History   Occupation: Midwife  Tobacco Use   Smoking status: Never    Passive exposure: Past   Smokeless tobacco: Never  Vaping Use   Vaping status: Never Used  Substance and Sexual Activity   Alcohol use: No   Drug use: No   Sexual activity: Not on file

## 2024-02-18 ENCOUNTER — Other Ambulatory Visit: Payer: Self-pay | Admitting: Family Medicine

## 2024-02-19 NOTE — Telephone Encounter (Signed)
 Requested medications are due for refill today.  yes  Requested medications are on the active medications list.  yes  Last refill. 12/18/2023 #60 0 rf  Future visit scheduled.   no  Notes to clinic.  Refill/refusal not delegated.    Requested Prescriptions  Pending Prescriptions Disp Refills   cyclobenzaprine (FLEXERIL) 10 MG tablet [Pharmacy Med Name: CYCLOBENZAPRINE 10 MG TABLET] 60 tablet 0    Sig: TAKE 1 TABLET BY MOUTH EVERY 8 HOURS AS NEEDED FOR MUSCLE SPASMS     Not Delegated - Analgesics:  Muscle Relaxants Failed - 02/19/2024  3:24 PM      Failed - This refill cannot be delegated      Failed - Valid encounter within last 6 months    Recent Outpatient Visits           2 years ago Fatigue, unspecified type   [facility] Donita Brooks, MD   2 years ago Palpitations   [facility] Valentino Nose, NP   2 years ago Bronchitis   [facility] Valentino Nose, NP   2 years ago Neck pain   [facility] Tanya Nones, Priscille Heidelberg, MD   2 years ago Fibromyalgia   [facility] Pickard, Priscille Heidelberg, MD

## 2024-02-26 ENCOUNTER — Other Ambulatory Visit: Payer: Self-pay | Admitting: Family Medicine

## 2024-02-27 ENCOUNTER — Telehealth: Payer: Self-pay | Admitting: Physician Assistant

## 2024-02-27 ENCOUNTER — Other Ambulatory Visit: Payer: Self-pay | Admitting: Physician Assistant

## 2024-02-27 MED ORDER — TRAMADOL HCL 50 MG PO TABS: 50.0000 mg | ORAL_TABLET | Freq: Two times a day (BID) | ORAL | 0 refills | Status: AC | PRN

## 2024-02-27 NOTE — Telephone Encounter (Signed)
 Looks like she was prescribed oxycodone on 2/17, but I will go ahead and send in tramadol to pharmacy on file.  Cannot send in anything stronger

## 2024-02-27 NOTE — Telephone Encounter (Signed)
 Patient called. Says she is still pain.

## 2024-02-27 NOTE — Telephone Encounter (Signed)
 Spoke with patient. She is no better since her last visit. Last note said to follow up as needed. She is currently in class and will call back to make a follow up with Dr.Xu.

## 2024-02-28 NOTE — Telephone Encounter (Signed)
 Requested medication (s) are due for refill today - yes  Requested medication (s) are on the active medication list -yes  Future visit scheduled -no  Last refill: 01/28/24 #30  Notes to clinic: non delegated Rx  Requested Prescriptions  Pending Prescriptions Disp Refills   ALPRAZolam (XANAX) 0.5 MG tablet [Pharmacy Med Name: ALPRAZOLAM 0.5 MG TABLET] 30 tablet 0    Sig: TAKE 1 TABLET (0.5 MG TOTAL) BY MOUTH 3 (THREE) TIMES DAILY AS NEEDED FOR ANXIETY.     Not Delegated - Psychiatry: Anxiolytics/Hypnotics 2 Failed - 02/28/2024  8:38 AM      Failed - This refill cannot be delegated      Failed - Urine Drug Screen completed in last 360 days      Failed - Valid encounter within last 6 months    Recent Outpatient Visits           2 years ago Fatigue, unspecified type   [facility] Pickard, Priscille Heidelberg, MD   2 years ago Palpitations   [facility] Valentino Nose, NP   2 years ago Bronchitis   [facility] Valentino Nose, NP   2 years ago Neck pain   [facility] Tanya Nones, Priscille Heidelberg, MD   2 years ago Fibromyalgia   [facility] Pickard, Priscille Heidelberg, MD       Future Appointments             In 5 days Tarry Kos, MD [facility]            Passed - Patient is not pregnant         Requested Prescriptions  Pending Prescriptions Disp Refills   ALPRAZolam (XANAX) 0.5 MG tablet [Pharmacy Med Name: ALPRAZOLAM 0.5 MG TABLET] 30 tablet 0    Sig: TAKE 1 TABLET (0.5 MG TOTAL) BY MOUTH 3 (THREE) TIMES DAILY AS NEEDED FOR ANXIETY.     Not Delegated - Psychiatry: Anxiolytics/Hypnotics 2 Failed - 02/28/2024  8:38 AM      Failed - This refill cannot be delegated      Failed - Urine Drug Screen completed in last 360 days      Failed - Valid encounter within last 6 months    Recent Outpatient Visits           2 years ago Fatigue, unspecified type   [facility]  Medicine Tanya Nones, Priscille Heidelberg, MD   2 years ago Palpitations   [facility] Valentino Nose, NP   2 years ago Bronchitis   [facility] Valentino Nose, NP   2 years ago Neck pain   [facility] Tanya Nones, Priscille Heidelberg, MD   2 years ago Fibromyalgia   [facility] Pickard, Priscille Heidelberg, MD       Future Appointments             In 5 days Tarry Kos, MD [facility] - Patient is not pregnant

## 2024-03-03 NOTE — Progress Notes (Unsigned)
 Office Visit Note   Patient: Andrea [name]           Date of Birth: 02/02/1971           MRN: [id number] Visit Date: 03/04/2024              Requested by:  Donita Brooks, MD [address] [place],  [place] [address]  PCP: Donita Brooks, MD   Assessment & Plan: Visit Diagnoses: No diagnosis found.  Plan: ***  Follow-Up Instructions: No follow-ups on file.   Orders:  No orders of the defined types were placed in this encounter. No orders of the defined types were placed in this encounter.    Procedures: No procedures performed   Clinical Data: No additional findings.   Subjective: No chief complaint on file.  HPI  Review of Systems   Objective: Vital Signs: LMP  (LMP Unknown)   Physical Exam  Ortho Exam  Specialty Comments:  No specialty comments available.  Imaging: No results found.   PMFS History: Patient Active Problem List   Diagnosis Date Noted  . Migraine headache 01/18/2024  . Acute bacterial sinusitis 01/18/2024  . Elevated TSH 01/01/2024  . Primary osteoarthritis of left knee 12/10/2023  . De Quervain's disease (radial styloid tenosynovitis) 10/12/2022  . Hyperglycemia 08/15/2021  . Class 2 obesity due to excess calories with body mass index (BMI) of 35.0 to 35.9 in adult 08/15/2021  . Lateral epicondylitis of right elbow 08/26/2018  . Trigger middle finger of right hand 08/26/2018  . Irritable bowel syndrome 06/29/2018  . Fibromyalgia 08/15/2017  . Chest pain with low risk for cardiac etiology 09/02/2013  . Family history of early CAD 09/02/2013  . B12 deficiency    Past Medical History:  Diagnosis Date  . Abnormal uterine bleeding    Pt follows with Ellison Hughs, MD. She is scheduled for a hysterectomy on 09/07/22.  . Angiolipoma of kidney    around 8 years ago per pt on 08/29/22, benign  . Anxiety    follows w/ PCP Dr. Tanya Nones, Theron Arista 07/07/22  . Arthritis    "hands" (09/02/2013), neck  . Asthma    seasonal,  follows with PCP, Dr. Tanya Nones  . B12 deficiency   . Cervical polyp 2023  . Epicondylitis, lateral    Right  . Fibromyalgia    sees PCP, Dr. Tanya Nones, LOV 07/07/22 in Epic  . GERD (gastroesophageal reflux disease)   . History of recurrent UTIs    Hx of multiple UTIs. As of 08/23/22, most recent UTI on 08/16/22 treated with Macrobid 100 mg bid x 7 days. Urine culture negative. See OV notes from Kurtis Bushman, FNP in [place], dated 08/16/22 & 08/18/22.  . IBS (irritable bowel syndrome)    .  Marland Kitchen Migraine headache    Pt takes Emgality monthly injections and follows with PCP, Dr. Lynnea Ferrier @ [facility].  . Palpitations 06/2022   Hx of heart palpitations, PVCs. Pt had increased heart palpitations in 06/2022 during a urinary tract infection and fever. 06/13/22 EKG in Epic.  Marland Kitchen UTI (urinary tract infection)     Family History  Problem Relation Age of Onset  . COPD Mother   . Lung cancer Mother   . Coronary artery disease Father 35       MI  . Heart attack Father   . Alcoholism Father   . Cirrhosis Father   . Diabetes Sister   . Irregular heart beat Sister   .  Arrhythmia Paternal Uncle   . Congestive Heart Failure Paternal Grandmother   . Healthy Daughter   . Healthy Daughter     Past Surgical History:  Procedure Laterality Date  . APPENDECTOMY  12/11/1982  . CATARACT EXTRACTION Bilateral 2017  . COLONOSCOPY  06/05/2022   One 7 mm sessile sigmoid polyp  . CYSTOSCOPY N/A 09/07/2022   Procedure: CYSTOSCOPY;  Surgeon: Carlisle Cater, MD;  [facility];  Service: Gynecology;  Laterality: N/A;  . HYSTEROSCOPY  11/2021  . LAPAROSCOPIC BILATERAL SALPINGECTOMY Bilateral 09/07/2022   Procedure: LAPAROSCOPIC BILATERAL SALPINGECTOMY;  Surgeon: Carlisle Cater, MD;  Location[facility];  Service: Gynecology;  Laterality: Bilateral;  . LAPAROSCOPIC SUPRACERVICAL HYSTERECTOMY N/A 09/07/2022   Procedure: ABORTED TOTAL LAPAROSCOPIC HYSTERECTOMY /  laparoscopic assisted vaginal hysterectomy;  Surgeon: Carlisle Cater, MD;  Location: [facility];  Service: Gynecology;  Laterality: N/A;  . PALATAL EXPANSION  12/11/2002  . REDUCTION MAMMAPLASTY Bilateral ~ 2009   Social History   Occupational History  . Occupation: Midwife  Tobacco Use  . Smoking status: Never    Passive exposure: Past  . Smokeless tobacco: Never  Vaping Use  . Vaping status: Never Used  Substance and Sexual Activity  . Alcohol use: No  . Drug use: No  . Sexual activity: Not on file

## 2024-03-04 ENCOUNTER — Encounter: Payer: Self-pay | Admitting: Orthopaedic Surgery

## 2024-03-04 ENCOUNTER — Ambulatory Visit (INDEPENDENT_AMBULATORY_CARE_PROVIDER_SITE_OTHER): Admitting: Orthopaedic Surgery

## 2024-03-04 DIAGNOSIS — M1712 Unilateral primary osteoarthritis, left knee: Secondary | ICD-10-CM

## 2024-03-04 MED ORDER — CELECOXIB 200 MG PO CAPS: 200.0000 mg | ORAL_CAPSULE | Freq: Two times a day (BID) | ORAL | 3 refills | Status: AC

## 2024-03-07 ENCOUNTER — Encounter: Payer: Self-pay | Admitting: Family Medicine

## 2024-03-07 ENCOUNTER — Other Ambulatory Visit: Payer: Self-pay | Admitting: Family Medicine

## 2024-03-07 MED ORDER — ALBUTEROL SULFATE HFA 108 (90 BASE) MCG/ACT IN AERS: 2.0000 | INHALATION_SPRAY | Freq: Four times a day (QID) | RESPIRATORY_TRACT | 2 refills | Status: AC | PRN

## 2024-03-16 ENCOUNTER — Encounter: Payer: Self-pay | Admitting: Family Medicine

## 2024-03-18 ENCOUNTER — Other Ambulatory Visit: Payer: Self-pay | Admitting: Family Medicine

## 2024-03-18 DIAGNOSIS — G8929 Other chronic pain: Secondary | ICD-10-CM

## 2024-03-19 ENCOUNTER — Other Ambulatory Visit: Payer: Self-pay

## 2024-03-19 DIAGNOSIS — G43829 Menstrual migraine, not intractable, without status migrainosus: Secondary | ICD-10-CM

## 2024-03-19 MED ORDER — EMGALITY 120 MG/ML ~~LOC~~ SOAJ: SUBCUTANEOUS | 3 refills | Status: DC

## 2024-03-19 NOTE — Telephone Encounter (Signed)
 Prescription Request  03/19/2024  LOV: 01/18/24  What is the name of the medication or equipment? EMGALITY 120 MG/ML SOAJ [[id number]]   Have you contacted your pharmacy to request a refill? Yes   Which pharmacy would you like this sent to?  [id number] [place], [place] - [address] [place] [address] Phone: [phone] Fax: [phone]    Patient notified that their request is being sent to the clinical staff for review and that they should receive a response within 2 business days.   Please advise at [facility] [phone]

## 2024-03-19 NOTE — Telephone Encounter (Signed)
 Requested medication (s) are due for refill today: Yes  Requested medication (s) are on the active medication list: Yes  Last refill:  10/29/23  Future visit scheduled: No  Notes to clinic:  Unable to refill due to no refill protocol for this medication.      Requested Prescriptions  Pending Prescriptions Disp Refills   EMGALITY 120 MG/ML SOAJ 1.12 mL 3    Sig: SMARTSIG:120 Milligram(s) SUB-Q Once a Month     Off-Protocol Failed - 03/19/2024  1:21 PM      Failed - Medication not assigned to a protocol, review manually.      Passed - Valid encounter within last 12 months    Recent Outpatient Visits           2 months ago Acute bacterial sinusitis   [place] [facility] Bernadette Hoit, MD   4 months ago Acute medial meniscal tear, left, subsequent encounter   [place] [facility] Donita Brooks, MD   5 months ago Diverticulitis   [place] [facility] Donita Brooks, MD   11 months ago Palpitations   [place] [facility] Pickard, Priscille Heidelberg, MD   11 months ago Polyarthralgia   [place] [facility] Pickard, Priscille Heidelberg, MD

## 2024-03-21 ENCOUNTER — Telehealth: Payer: Self-pay | Admitting: Family Medicine

## 2024-03-21 NOTE — Telephone Encounter (Signed)
 Copied from CRM [id number]. Topic: Complaint (DO NOT CONVERT) - Billing/Coding >> Mar 21, 2024  2:24 PM Andrea [name] wrote: DOS: [phone] Details of complaint: The patient has received additional billing for their labs for thyroid testing, the patient was assured that the labs would be covered by their insurance  How would the patient like to see this issue resolved? The patient would like to be contacted to be assured that the visits are filed with their insurance under the correct codes to avoid further billing    Route to Research officer, political party.

## 2024-03-31 ENCOUNTER — Other Ambulatory Visit: Payer: Self-pay | Admitting: Family Medicine

## 2024-03-31 MED ORDER — OXYCODONE-ACETAMINOPHEN 5-325 MG PO TABS: 1.0000 | ORAL_TABLET | ORAL | 0 refills | Status: DC | PRN

## 2024-04-04 ENCOUNTER — Other Ambulatory Visit: Payer: Self-pay | Admitting: Family Medicine

## 2024-04-04 NOTE — Telephone Encounter (Signed)
 Requested medication (s) are due for refill today: Yes  Requested medication (s) are on the active medication list: Yes  Last refill:  02/28/24  Future visit scheduled: No  Notes to clinic:  Unable to refill per protocol, cannot delegate.      Requested Prescriptions  Pending Prescriptions Disp Refills   ALPRAZolam  (XANAX ) 0.5 MG tablet [Pharmacy Med Name: ALPRAZOLAM  0.5 MG TABLET] 30 tablet 0    Sig: TAKE 1 TABLET (0.5 MG TOTAL) BY MOUTH 3 (THREE) TIMES DAILY AS NEEDED FOR ANXIETY.     Not Delegated - Psychiatry: Anxiolytics/Hypnotics 2 Failed - 04/04/2024  2:07 PM      Failed - This refill cannot be delegated      Failed - Urine Drug Screen completed in last 360 days      Passed - Patient is not pregnant      Passed - Valid encounter within last 6 months    Recent Outpatient Visits           2 months ago Acute bacterial sinusitis   [place] [facility] Amadeo June, MD   5 months ago Acute medial meniscal tear, left, subsequent encounter   [place] [facility] Austine Lefort, MD   6 months ago Diverticulitis   [place] [facility] Austine Lefort, MD   11 months ago Palpitations   [place] [facility] Pickard, Cisco Crest, MD   1 year ago Polyarthralgia   [place] [facility] Pickard, Cisco Crest, MD

## 2024-04-20 ENCOUNTER — Other Ambulatory Visit: Payer: Self-pay | Admitting: Family Medicine

## 2024-04-22 NOTE — Telephone Encounter (Signed)
 Requested medications are due for refill today.  yes  Requested medications are on the active medications list.  yes  Last refill. 02/19/2024 #60 0 rf  Future visit scheduled.   no  Notes to clinic.  Refill not delegated.    Requested Prescriptions  Pending Prescriptions Disp Refills   cyclobenzaprine  (FLEXERIL ) 10 MG tablet [Pharmacy Med Name: CYCLOBENZAPRINE  10 MG TABLET] 60 tablet 0    Sig: TAKE 1 TABLET BY MOUTH EVERY 8 HOURS AS NEEDED FOR MUSCLE SPASM     Not Delegated - Analgesics:  Muscle Relaxants Failed - 04/22/2024 11:29 AM      Failed - This refill cannot be delegated      Passed - Valid encounter within last 6 months    Recent Outpatient Visits           3 months ago Acute bacterial sinusitis   [place] [facility] Amadeo June, MD   6 months ago Acute medial meniscal tear, left, subsequent encounter   [place] [facility] Austine Lefort, MD   6 months ago Diverticulitis   [place] [facility] Austine Lefort, MD   1 year ago Palpitations   [place] [facility] Austine Lefort, MD   1 year ago Polyarthralgia   [place] [facility] Pickard, Cisco Crest, MD

## 2024-04-30 ENCOUNTER — Other Ambulatory Visit (HOSPITAL_COMMUNITY): Payer: Self-pay

## 2024-05-06 ENCOUNTER — Other Ambulatory Visit (HOSPITAL_COMMUNITY): Payer: Self-pay

## 2024-05-06 ENCOUNTER — Encounter: Payer: Self-pay | Admitting: Family Medicine

## 2024-05-07 ENCOUNTER — Telehealth: Payer: Self-pay | Admitting: Pharmacy Technician

## 2024-05-07 ENCOUNTER — Other Ambulatory Visit (HOSPITAL_COMMUNITY): Payer: Self-pay

## 2024-05-07 NOTE — Telephone Encounter (Signed)
 Spoke with patient, she stated that she is no longer interested in the SWL at this time and would like more information on [facility]. Informed pt I would send her [facility] info and remove her from the SWL program. Pt expressed understanding of all that was discussed.

## 2024-05-07 NOTE — Telephone Encounter (Signed)
 Pharmacy Patient Advocate Encounter   Received notification from Pt Calls Messages that prior authorization for Emgality  120MG /ML auto-injectors (migraine) is required/requested.   Insurance verification completed.   The patient is insured through [facility] .   Per test claim: PA required; PA submitted to above mentioned insurance via CoverMyMeds Key/confirmation #/EOC [id number] Status is pending

## 2024-05-07 NOTE — Telephone Encounter (Signed)
 Pharmacy Patient Advocate Encounter  Received notification from [facility] that Prior Authorization for Emgality  120MG /ML auto-injectors (migraine) has been APPROVED from 05/07/24 to 05/07/25   PA #/Case ID/Reference #: [id number]

## 2024-05-12 ENCOUNTER — Other Ambulatory Visit (HOSPITAL_COMMUNITY): Payer: Self-pay

## 2024-05-14 ENCOUNTER — Other Ambulatory Visit: Payer: Self-pay | Admitting: Family Medicine

## 2024-05-14 ENCOUNTER — Other Ambulatory Visit (HOSPITAL_COMMUNITY): Payer: Self-pay

## 2024-05-14 DIAGNOSIS — G43829 Menstrual migraine, not intractable, without status migrainosus: Secondary | ICD-10-CM

## 2024-05-14 NOTE — Telephone Encounter (Unsigned)
 Copied from CRM [id number]. Topic: Clinical - Medication Refill >> May 14, 2024  9:47 AM Oddis Bench wrote: Medication: EMGALITY  120 MG/ML SOAJ  Has the patient contacted their pharmacy? Yes Need to call PCP  This is the patient's preferred pharmacy:  [id number] [place], [place] - [address] [place] [address] Phone: [phone] Fax: [phone]  Is this the correct pharmacy for this prescription? Yes If no, delete pharmacy and type the correct one.   Has the prescription been filled recently? Yes  Is the patient out of the medication? Yes  Has the patient been seen for an appointment in the last year OR does the patient have an upcoming appointment? Yes  Can we respond through MyChart? Yes  Agent: Please be advised that Rx refills may take up to 3 business days. We ask that you follow-up with your pharmacy.

## 2024-05-15 NOTE — Telephone Encounter (Signed)
 Requested medication (s) are due for refill today: Yes  Requested medication (s) are on the active medication list: Yes  Last refill:  03/19/24  Future visit scheduled: No  Notes to clinic:  Manual review.    Requested Prescriptions  Pending Prescriptions Disp Refills   EMGALITY  120 MG/ML SOAJ 1.12 mL 3    Sig: SMARTSIG:120 Milligram(s) SUB-Q Once a Month     Off-Protocol Failed - 05/15/2024 11:45 AM      Failed - Medication not assigned to a protocol, review manually.      Passed - Valid encounter within last 12 months    Recent Outpatient Visits           3 months ago Acute bacterial sinusitis   [place] [facility] Amadeo June, MD   6 months ago Acute medial meniscal tear, left, subsequent encounter   [place] [facility] Austine Lefort, MD   7 months ago Diverticulitis   [place] [facility] Austine Lefort, MD   1 year ago Palpitations   [place] [facility] Austine Lefort, MD   1 year ago Polyarthralgia   [place] [facility] Pickard, Cisco Crest, MD

## 2024-05-16 ENCOUNTER — Other Ambulatory Visit (HOSPITAL_COMMUNITY): Payer: Self-pay

## 2024-05-16 MED ORDER — EMGALITY 120 MG/ML ~~LOC~~ SOAJ: SUBCUTANEOUS | 3 refills | Status: DC

## 2024-05-18 ENCOUNTER — Other Ambulatory Visit: Payer: Self-pay | Admitting: Family Medicine

## 2024-06-14 ENCOUNTER — Other Ambulatory Visit: Payer: Self-pay | Admitting: Family Medicine

## 2024-06-17 ENCOUNTER — Other Ambulatory Visit: Payer: Self-pay

## 2024-06-17 ENCOUNTER — Encounter: Payer: Self-pay | Admitting: Family Medicine

## 2024-06-17 MED ORDER — OXYCODONE-ACETAMINOPHEN 5-325 MG PO TABS: 1.0000 | ORAL_TABLET | ORAL | 0 refills | Status: DC | PRN

## 2024-07-02 ENCOUNTER — Other Ambulatory Visit: Payer: Self-pay | Admitting: Family Medicine

## 2024-07-23 ENCOUNTER — Telehealth: Payer: Self-pay

## 2024-07-23 NOTE — Telephone Encounter (Signed)
 Pt thinks that she may be having a diverticulitis flare. Pt c/o abdominal pain that is exactly like when she had it before. Pt asks if an abx can be sent in for her.  Pt is aware you are out of the office today. Pt also advised if she has increasing abdominal pain, n/v, fever to go to ER. Pt verbalized understanding of all. Mjp,lpn

## 2024-07-24 ENCOUNTER — Other Ambulatory Visit: Payer: Self-pay | Admitting: Family Medicine

## 2024-07-24 MED ORDER — AMOXICILLIN-POT CLAVULANATE 875-125 MG PO TABS: 1.0000 | ORAL_TABLET | Freq: Two times a day (BID) | ORAL | 0 refills | Status: AC

## 2024-08-04 ENCOUNTER — Other Ambulatory Visit: Payer: Self-pay | Admitting: Family Medicine

## 2024-08-05 ENCOUNTER — Encounter: Payer: Self-pay | Admitting: Family Medicine

## 2024-08-18 ENCOUNTER — Encounter: Payer: Self-pay | Admitting: Family Medicine

## 2024-08-29 ENCOUNTER — Other Ambulatory Visit: Payer: Self-pay | Admitting: Family Medicine

## 2024-08-29 ENCOUNTER — Encounter: Payer: Self-pay | Admitting: Family Medicine

## 2024-08-29 MED ORDER — OXYCODONE-ACETAMINOPHEN 5-325 MG PO TABS: 1.0000 | ORAL_TABLET | ORAL | 0 refills | Status: DC | PRN

## 2024-09-01 ENCOUNTER — Other Ambulatory Visit: Payer: Self-pay | Admitting: Family Medicine

## 2024-09-01 NOTE — Telephone Encounter (Signed)
 Requested medication (s) are due for refill today: yes  Requested medication (s) are on the active medication list: no  Last refill:  08/05/24  Future visit scheduled: no  Notes to clinic:  Unable to refill per protocol, cannot delegate.      Requested Prescriptions  Pending Prescriptions Disp Refills   ALPRAZolam  (XANAX ) 0.5 MG tablet [Pharmacy Med Name: ALPRAZOLAM  0.5 MG TABLET] 30 tablet 0    Sig: TAKE 1 TABLET (0.5 MG TOTAL) BY MOUTH 3 (THREE) TIMES DAILY AS NEEDED FOR ANXIETY.     Not Delegated - Psychiatry: Anxiolytics/Hypnotics 2 Failed - 09/01/2024 10:48 AM      Failed - This refill cannot be delegated      Failed - Urine Drug Screen completed in last 360 days      Failed - Valid encounter within last 6 months    Recent Outpatient Visits           7 months ago Acute bacterial sinusitis   [place] [facility] Aletha Bene, MD   10 months ago Acute medial meniscal tear, left, subsequent encounter   [place] [facility] Duanne Butler DASEN, MD   11 months ago Diverticulitis   [place] [facility] Duanne Butler DASEN, MD   1 year ago Palpitations   [place] [facility] Duanne Butler DASEN, MD   1 year ago Polyarthralgia   [place] [facility] Pickard, Butler DASEN, MD              Passed - Patient is not pregnant

## 2024-09-22 ENCOUNTER — Other Ambulatory Visit: Payer: Self-pay | Admitting: Family Medicine

## 2024-09-22 ENCOUNTER — Other Ambulatory Visit: Payer: Self-pay

## 2024-09-22 ENCOUNTER — Encounter: Payer: Self-pay | Admitting: Family Medicine

## 2024-09-22 DIAGNOSIS — K589 Irritable bowel syndrome without diarrhea: Secondary | ICD-10-CM

## 2024-09-22 MED ORDER — LINACLOTIDE 290 MCG PO CAPS: ORAL_CAPSULE | ORAL | 1 refills | Status: AC

## 2024-10-13 ENCOUNTER — Encounter: Payer: Self-pay | Admitting: Radiology

## 2024-10-19 ENCOUNTER — Other Ambulatory Visit: Payer: Self-pay | Admitting: Family Medicine

## 2024-10-20 NOTE — Telephone Encounter (Signed)
 Requested medications are due for refill today.  yes  Requested medications are on the active medications list.  yes  Last refill. 09/22/2024 #30 0 rf  Future visit scheduled.   no  Notes to clinic.  Refill not delegated.    Requested Prescriptions  Pending Prescriptions Disp Refills   ALPRAZolam  (XANAX ) 0.5 MG tablet [Pharmacy Med Name: ALPRAZOLAM  0.5 MG TABLET] 30 tablet 0    Sig: TAKE 1 TABLET (0.5 MG TOTAL) BY MOUTH 3 (THREE) TIMES DAILY AS NEEDED FOR ANXIETY.     Not Delegated - Psychiatry: Anxiolytics/Hypnotics 2 Failed - 10/20/2024  5:22 PM      Failed - This refill cannot be delegated      Failed - Urine Drug Screen completed in last 360 days      Failed - Valid encounter within last 6 months    Recent Outpatient Visits           9 months ago Acute bacterial sinusitis   [place] [facility] Aletha Bene, MD   12 months ago Acute medial meniscal tear, left, subsequent encounter   [place] [facility] Duanne Butler DASEN, MD   1 year ago Diverticulitis   [place] [facility] Duanne Butler DASEN, MD   1 year ago Palpitations   [facility] Duanne Butler DASEN, MD   1 year ago Polyarthralgia   [place] [facility] Pickard, Butler DASEN, MD              Passed - Patient is not pregnant

## 2024-10-23 ENCOUNTER — Encounter: Payer: Self-pay | Admitting: Family Medicine

## 2024-11-04 ENCOUNTER — Encounter: Payer: Self-pay | Admitting: Family Medicine

## 2024-11-04 ENCOUNTER — Other Ambulatory Visit: Payer: Self-pay | Admitting: Family Medicine

## 2024-11-04 MED ORDER — OXYCODONE-ACETAMINOPHEN 5-325 MG PO TABS: 1.0000 | ORAL_TABLET | ORAL | 0 refills | Status: DC | PRN

## 2024-11-15 ENCOUNTER — Other Ambulatory Visit: Payer: Self-pay | Admitting: Family Medicine

## 2024-11-18 ENCOUNTER — Other Ambulatory Visit: Payer: Self-pay | Admitting: Family Medicine

## 2024-12-08 ENCOUNTER — Other Ambulatory Visit: Payer: Self-pay | Admitting: Family Medicine

## 2024-12-16 ENCOUNTER — Encounter: Payer: Self-pay | Admitting: Family Medicine

## 2024-12-16 ENCOUNTER — Other Ambulatory Visit: Payer: Self-pay | Admitting: Family Medicine

## 2024-12-16 ENCOUNTER — Other Ambulatory Visit: Payer: Self-pay

## 2024-12-16 MED ORDER — PROMETHAZINE HCL 25 MG PO TABS: ORAL_TABLET | ORAL | 1 refills | Status: AC

## 2024-12-30 ENCOUNTER — Other Ambulatory Visit: Payer: Self-pay | Admitting: Family Medicine

## 2024-12-30 DIAGNOSIS — G43829 Menstrual migraine, not intractable, without status migrainosus: Secondary | ICD-10-CM

## 2025-01-05 ENCOUNTER — Ambulatory Visit: Admitting: Family Medicine

## 2025-01-06 ENCOUNTER — Other Ambulatory Visit: Payer: Self-pay | Admitting: Family Medicine

## 2025-01-06 NOTE — Telephone Encounter (Unsigned)
 Copied from CRM #[id number]. Topic: Clinical - Medication Question >> Jan 06, 2025 11:59 AM Joesph NOVAK wrote: Reason for CRM: appointment cancelled due to the weather yesterday, patient rescheduled to 01/27/2025(her next day off) she would like to know if Dr.Pickard will send her in a supply to last until her appt? Please advise   ALPRAZolam  (XANAX ) 0.5 MG tablet [[id number]]

## 2025-01-07 NOTE — Telephone Encounter (Signed)
 Requested medication (s) are due for refill today: na  Requested medication (s) are on the active medication list: yes  Last refill:  12/16/24 #30 0 refills  Future visit scheduled: yes 01/27/25  Notes to clinic:  not delegated per protocol. Patient requesting refill prior to OV due to OV yesterday canceled due to inclement weather. See previous encounter . Do you want to refill Rx or give enough until next OV?     Requested Prescriptions  Pending Prescriptions Disp Refills   ALPRAZolam  (XANAX ) 0.5 MG tablet 30 tablet 0    Sig: Take 1 tablet (0.5 mg total) by mouth 3 (three) times daily as needed for anxiety.     Not Delegated - Psychiatry: Anxiolytics/Hypnotics 2 Failed - 01/07/2025 11:41 AM      Failed - This refill cannot be delegated      Failed - Urine Drug Screen completed in last 360 days      Failed - Valid encounter within last 6 months    Recent Outpatient Visits           11 months ago Acute bacterial sinusitis   [place] [facility] Aletha Bene, MD   1 year ago Acute medial meniscal tear, left, subsequent encounter   [place] [facility] Duanne Butler DASEN, MD   1 year ago Diverticulitis   [place] [facility] Duanne Butler DASEN, MD   1 year ago Palpitations   [place] [facility] Duanne Butler DASEN, MD   1 year ago Polyarthralgia    [facility] Pickard, Butler DASEN, MD              Passed - Patient is not pregnant

## 2025-01-08 MED ORDER — ALPRAZOLAM 0.5 MG PO TABS: 0.5000 mg | ORAL_TABLET | Freq: Three times a day (TID) | ORAL | 0 refills | Status: AC | PRN

## 2025-01-13 ENCOUNTER — Other Ambulatory Visit: Payer: Self-pay

## 2025-01-13 ENCOUNTER — Encounter: Payer: Self-pay | Admitting: Family Medicine

## 2025-01-14 MED ORDER — OXYCODONE-ACETAMINOPHEN 5-325 MG PO TABS: 1.0000 | ORAL_TABLET | ORAL | 0 refills | Status: AC | PRN

## 2025-01-27 ENCOUNTER — Ambulatory Visit: Admitting: Family Medicine
